# Patient Record
Sex: Female | Born: 1940 | Race: White | Hispanic: No | Smoking: Former smoker
Health system: Southern US, Community
[De-identification: ages and names within clinical notes are randomized; demographics above are authoritative.]

## PROBLEM LIST (undated history)

## (undated) DIAGNOSIS — G629 Polyneuropathy, unspecified: Secondary | ICD-10-CM

## (undated) DIAGNOSIS — G510 Bell's palsy: Secondary | ICD-10-CM

## (undated) DIAGNOSIS — F32A Depression, unspecified: Secondary | ICD-10-CM

## (undated) DIAGNOSIS — J449 Chronic obstructive pulmonary disease, unspecified: Secondary | ICD-10-CM

## (undated) DIAGNOSIS — C801 Malignant (primary) neoplasm, unspecified: Secondary | ICD-10-CM

## (undated) DIAGNOSIS — I10 Essential (primary) hypertension: Secondary | ICD-10-CM

## (undated) DIAGNOSIS — R1032 Left lower quadrant pain: Secondary | ICD-10-CM

## (undated) DIAGNOSIS — F329 Major depressive disorder, single episode, unspecified: Secondary | ICD-10-CM

## (undated) DIAGNOSIS — N952 Postmenopausal atrophic vaginitis: Secondary | ICD-10-CM

## (undated) DIAGNOSIS — R634 Abnormal weight loss: Secondary | ICD-10-CM

## (undated) DIAGNOSIS — M1990 Unspecified osteoarthritis, unspecified site: Secondary | ICD-10-CM

## (undated) DIAGNOSIS — R197 Diarrhea, unspecified: Secondary | ICD-10-CM

## (undated) DIAGNOSIS — R630 Anorexia: Secondary | ICD-10-CM

## (undated) DIAGNOSIS — R0600 Dyspnea, unspecified: Secondary | ICD-10-CM

## (undated) DIAGNOSIS — Z8742 Personal history of other diseases of the female genital tract: Secondary | ICD-10-CM

## (undated) HISTORY — DX: Left lower quadrant pain: R10.32

## (undated) HISTORY — PX: APPENDECTOMY: SHX54

## (undated) HISTORY — DX: Depression, unspecified: F32.A

## (undated) HISTORY — DX: Abnormal weight loss: R63.4

## (undated) HISTORY — DX: Polyneuropathy, unspecified: G62.9

## (undated) HISTORY — DX: Personal history of other diseases of the female genital tract: Z87.42

## (undated) HISTORY — PX: OTHER SURGICAL HISTORY: SHX169

## (undated) HISTORY — DX: Diarrhea, unspecified: R19.7

## (undated) HISTORY — PX: TOTAL KNEE ARTHROPLASTY: SHX125

## (undated) HISTORY — PX: KNEE SURGERY: SHX244

## (undated) HISTORY — PX: CHOLECYSTECTOMY: SHX55

## (undated) HISTORY — PX: TONSILLECTOMY AND ADENOIDECTOMY: SHX28

## (undated) HISTORY — PX: EYE SURGERY: SHX253

## (undated) HISTORY — DX: Essential (primary) hypertension: I10

## (undated) HISTORY — DX: Major depressive disorder, single episode, unspecified: F32.9

## (undated) HISTORY — DX: Bell's palsy: G51.0

## (undated) HISTORY — DX: Dyspnea, unspecified: R06.00

## (undated) HISTORY — PX: SHOULDER SURGERY: SHX246

## (undated) HISTORY — DX: Unspecified osteoarthritis, unspecified site: M19.90

## (undated) HISTORY — DX: Anorexia: R63.0

## (undated) HISTORY — PX: ABDOMINAL HYSTERECTOMY: SHX81

## (undated) HISTORY — PX: GALLBLADDER SURGERY: SHX652

## (undated) HISTORY — PX: TRIGGER FINGER RELEASE: SHX641

## (undated) HISTORY — DX: Hemochromatosis, unspecified: E83.119

## (undated) HISTORY — PX: ELBOW SURGERY: SHX618

## (undated) HISTORY — DX: Postmenopausal atrophic vaginitis: N95.2

## (undated) HISTORY — PX: JOINT REPLACEMENT: SHX530

---

## 2003-01-18 ENCOUNTER — Ambulatory Visit (HOSPITAL_COMMUNITY): Admission: RE | Admit: 2003-01-18 | Discharge: 2003-01-18 | Payer: Self-pay | Admitting: Internal Medicine

## 2003-01-18 ENCOUNTER — Encounter: Payer: Self-pay | Admitting: Internal Medicine

## 2003-03-07 ENCOUNTER — Ambulatory Visit (HOSPITAL_COMMUNITY): Admission: RE | Admit: 2003-03-07 | Discharge: 2003-03-07 | Payer: Self-pay | Admitting: Internal Medicine

## 2003-03-15 ENCOUNTER — Ambulatory Visit (HOSPITAL_COMMUNITY): Admission: RE | Admit: 2003-03-15 | Discharge: 2003-03-15 | Payer: Self-pay | Admitting: Internal Medicine

## 2003-03-15 ENCOUNTER — Encounter: Payer: Self-pay | Admitting: Internal Medicine

## 2007-10-22 ENCOUNTER — Encounter: Payer: Self-pay | Admitting: Internal Medicine

## 2007-11-06 ENCOUNTER — Ambulatory Visit: Payer: Self-pay | Admitting: Internal Medicine

## 2007-11-06 DIAGNOSIS — I10 Essential (primary) hypertension: Secondary | ICD-10-CM | POA: Insufficient documentation

## 2007-11-06 DIAGNOSIS — Z8679 Personal history of other diseases of the circulatory system: Secondary | ICD-10-CM | POA: Insufficient documentation

## 2007-11-06 DIAGNOSIS — J449 Chronic obstructive pulmonary disease, unspecified: Secondary | ICD-10-CM | POA: Insufficient documentation

## 2007-11-06 DIAGNOSIS — E7800 Pure hypercholesterolemia, unspecified: Secondary | ICD-10-CM

## 2007-11-06 DIAGNOSIS — R0989 Other specified symptoms and signs involving the circulatory and respiratory systems: Secondary | ICD-10-CM

## 2007-11-06 DIAGNOSIS — R0609 Other forms of dyspnea: Secondary | ICD-10-CM | POA: Insufficient documentation

## 2007-11-09 ENCOUNTER — Ambulatory Visit: Payer: Self-pay | Admitting: Internal Medicine

## 2007-11-09 LAB — CONVERTED CEMR LAB
BUN: 17 mg/dL (ref 6–23)
Chloride: 104 meq/L (ref 96–112)
GFR calc non Af Amer: 77 mL/min
Glucose, Bld: 77 mg/dL (ref 70–99)
Lymphocytes Relative: 35.8 % (ref 12.0–46.0)
MCHC: 34.6 g/dL (ref 30.0–36.0)
MCV: 94.7 fL (ref 78.0–100.0)
Monocytes Absolute: 0.4 10*3/uL (ref 0.1–1.0)
Monocytes Relative: 7.4 % (ref 3.0–12.0)
Neutrophils Relative %: 54.7 % (ref 43.0–77.0)
Platelets: 245 10*3/uL (ref 150–400)
Potassium: 4.7 meq/L (ref 3.5–5.1)
RDW: 12.1 % (ref 11.5–14.6)

## 2007-11-12 ENCOUNTER — Telehealth (INDEPENDENT_AMBULATORY_CARE_PROVIDER_SITE_OTHER): Payer: Self-pay | Admitting: *Deleted

## 2007-11-27 ENCOUNTER — Ambulatory Visit: Payer: Self-pay | Admitting: Internal Medicine

## 2007-11-27 DIAGNOSIS — R079 Chest pain, unspecified: Secondary | ICD-10-CM | POA: Insufficient documentation

## 2010-11-23 NOTE — Op Note (Signed)
NAME:  Jessica Serrano, Jessica Serrano                      ACCOUNT NO.:  [id number]   MEDICAL RECORD NO.:  [id number]                   PATIENT TYPE:  AMB   LOCATION:  DAY                                  FACILITY:  [facility]   PHYSICIAN:  R. Roetta Sessions, M.D.              DATE OF BIRTH:  [birth year]   DATE OF PROCEDURE:  03/07/2003  DATE OF DISCHARGE:                                 OPERATIVE REPORT   PROCEDURE:  Colonoscopy (diagnostic).   INDICATIONS FOR PROCEDURE:  The patient is a 70 year old lady with  intermittent right upper quadrant abdominal pain.  Comes and goes.  Not  affected by eating or bowel movement.  She has had it for several months.  No improvement with Levbid or Fiber Choice.  Prior laboratory evaluation  with repeat LFTs demonstrate no abnormalities.  The patient is status post  cholecystectomy.  Prior ultrasound demonstrated bile duct  5 mm (post-  cholecystectomy).  CT scan of the abdomen and pelvis recently demonstrated  thickening in the area of the sigmoid colon.  Colonoscopy is now being done  as a diagnostic maneuver.  Prior colonoscopy 10 years ago.  The potential  risks, benefits, and alternatives of colonoscopy were fully discussed with  the patient.  Questions were answered, and she is agreeable.   PROCEDURE:  O2 saturation, blood pressure, pulses, and respirations were  monitored throughout the entire procedure.  Conscious sedation was with  Versed 7 mg IV, Demerol 150 mg IV in divided doses.  The instrument used was  the Olympus video chip adult colonoscope.   FINDINGS:  Digital rectal examination revealed no abnormalities.   ENDOSCOPIC FINDINGS:  The prep was adequate.   Rectum:  Examination of the rectal mucosa including retroflex view of the  anal verge revealed no abnormalities.   Colon:  The colonic mucosa was surveyed from the rectosigmoid junction  through the left, transverse, right colon to the area of the ileocecal  valve.  The patient had  numerous left-sided diverticula.  Upon nearing the  right colon, we encountered recurrent looping.  This was not overcome in  spite of multiple different applications of external abdominal pressure and  change in the patient's position from supine to right side down.  The  ileocecal valve was seen; however, the cecum was not seen well.  From this  level, the scope was slowly withdrawn.  All previously mentioned mucosal  surfaces were again seen, and no other abnormalities were observed.  The  patient tolerated the procedure well and was reactive in endoscopy.   IMPRESSION:  1. Normal rectum.  2. Left-sided diverticula.  The colonic mucosa to the ileocecal valve     appeared normal.  Cecum not seen well.  3. I suspect the change as seen on CT recently is reflective of     diverticulosis.    RECOMMENDATIONS:  1. Proceed with an air contrast barium enema, as the image of  the cecum was     not seen well today.  Plan for next week.  2. Will make further recommendations after the barium enema is available for     review.                                               Jonathon Bellows, M.D.    RMR/MEDQ  D:  03/07/2003  T:  03/07/2003  Job:  [id number]   cc:   Wyvonnia Lora  [address]  [place]  [place] [address]  Fax: [phone]

## 2012-03-11 DIAGNOSIS — R0602 Shortness of breath: Secondary | ICD-10-CM

## 2013-02-10 ENCOUNTER — Encounter (INDEPENDENT_AMBULATORY_CARE_PROVIDER_SITE_OTHER): Payer: Self-pay | Admitting: *Deleted

## 2013-04-16 ENCOUNTER — Encounter (INDEPENDENT_AMBULATORY_CARE_PROVIDER_SITE_OTHER): Payer: Self-pay

## 2013-04-16 ENCOUNTER — Ambulatory Visit (INDEPENDENT_AMBULATORY_CARE_PROVIDER_SITE_OTHER): Payer: Medicare Other | Admitting: Neurology

## 2013-04-16 ENCOUNTER — Encounter: Payer: Self-pay | Admitting: Neurology

## 2013-04-16 VITALS — BP 153/69 | HR 52 | Ht 64.5 in | Wt 248.0 lb

## 2013-04-16 DIAGNOSIS — G609 Hereditary and idiopathic neuropathy, unspecified: Secondary | ICD-10-CM | POA: Insufficient documentation

## 2013-04-16 MED ORDER — GABAPENTIN 300 MG PO CAPS: 300.0000 mg | ORAL_CAPSULE | Freq: Three times a day (TID) | ORAL | Status: DC

## 2013-04-16 NOTE — Progress Notes (Signed)
[facility]  PATIENT: Jessica Serrano DOB: 10-Apr-1941  HISTORICAL Fiora is a 72 years old right-handed Caucasian female, referred by her primary care physician Dr. Jonni Sanger for evaluation of bilateral feet paresthesia  She had past medical history of hypertension, hyperlipidemia, depression, anxiety, presenting with bilateral feet paresthesia, since 2010,  Initially it only involves plantar surface, now whole feet, also to distal leg to mid shin level, she complains of numbness tingling burning needle prick sensation, she denies significant low back pain, no gait difficulty, she has no bilateral fingertips paresthesia.  She has no significant gait difficulty, no incontinence, she has been taking Requip 1 mg every night, which helped her nighttime discomfort.  The most bothersome symptoms in the late afternoon, and nighttimes, she complains of bilateral feet and distal leg discomfort, burning sensation, difficulty falling into sleep, she also complains of nighttime muscle cramping, has to jump out of the bed walking around, it happened about once a month,   Most recent laboratory evaluation showed normal CMP, CBC, B12 TSH, A1c was 5.4  REVIEW OF SYSTEMS: Full 14 system review of systems performed and notable only for weight gain, fatigue, swelling in legs, hearing loss, ringing in the ears, trouble swallowing, eye pain, shortness of breath, wheezing, urination problems, feeling cold, increased thirst, joint pain, joint swelling, cramps, achy muscles, allergy, numbness, difficulty swelling, dizziness, insomnia, restless legs, depression, not enough sleep, decreased energy, disinterested in activities  ALLERGIES: No Known Allergies  HOME MEDICATIONS: No outpatient prescriptions prior to visit.   No facility-administered medications prior to visit.    PAST MEDICAL HISTORY: Past Medical History  Diagnosis Date  . High blood pressure   . Depression   . Bell's  palsy     right  . Dyspnea   . Neuropathy     PAST SURGICAL HISTORY: Past Surgical History  Procedure Laterality Date  . Knee surgery Left   . Lap chole stones    . Gallbladder surgery    . Tonsillectomy and adenoidectomy    . Abdominal hysterectomy    . Elbow surgery    . Shoulder surgery    . Trigger finger release      FAMILY HISTORY: Family History  Problem Relation Age of Onset  . Stroke    . Stomach cancer    . Heart Problems    . Arthritis      SOCIAL HISTORY:  History   Social History  . Marital Status: Widowed    Spouse Name: N/A    Number of Children: 3  . Years of Education: 12   Occupational History  .      retired   Social History Main Topics  . Smoking status: Former Smoker    Types: Cigarettes  . Smokeless tobacco: Never Used     Comment: Quit in 1993  . Alcohol Use: No     Comment: Quit in 1986  . Drug Use: No  . Sexual Activity: Not on file   Other Topics Concern  . Not on file   Social History Narrative   Patient is retired and lives at home alone.    High school education.   Caffeine - two cups daily.     PHYSICAL EXAM   Filed Vitals:   04/16/13 0946  BP: 153/69  Pulse: 52  Height: 5' 4.5" (1.638 m)  Weight: 248 lb (112.492 kg)    Body mass index is 41.93 kg/(m^2).   Generalized: In no acute distress  Neck: Supple, no carotid bruits   Cardiac: Regular rate rhythm  Pulmonary: Clear to auscultation bilaterally  Musculoskeletal: No deformity  Neurological examination  Mentation: Alert oriented to time, place, history taking, and causual conversation  Cranial nerve II-XII: Pupils were equal round reactive to light extraocular movements were full, visual field were full on confrontational test. facial sensation and strength were normal. hearing was intact to finger rubbing bilaterally. Uvula tongue midline.  head turning and shoulder shrug and were normal and symmetric.Tongue protrusion into cheek strength was  normal.  Motor: normal tone, bulk and strength.  Sensory: Mildly length dependent decreased  fine touch, pinprick to mid shin level, preserved vibratory sensation, and proprioception at toes.  Coordination: Normal finger to nose, heel-to-shin bilaterally there was no truncal ataxia  Gait: Rising up from seated position without assistance, normal stance, without trunk ataxia, moderate stride, good arm swing, smooth turning, able to perform tiptoe, and heel walking without difficulty.   Romberg signs: Negative  Deep tendon reflexes: Brachioradialis 2/2, biceps 2/2, triceps 2/2, patellar 2/0-left knee replacement, Achilles 1/1, plantar responses were flexor bilaterally.   DIAGNOSTIC DATA (LABS, IMAGING, TESTING) - I reviewed patient records, labs, notes, testing and imaging myself where available.  Lab Results  Component Value Date   WBC 5.7 11/06/2007   HGB 14.6 11/06/2007   HCT 42.1 11/06/2007   MCV 94.7 11/06/2007   PLT 245 11/06/2007      Component Value Date/Time   NA 141 11/06/2007 1546   K 4.7 11/06/2007 1546   CL 104 11/06/2007 1546   CO2 30 11/06/2007 1546   GLUCOSE 77 11/06/2007 1546   BUN 17 11/06/2007 1546   CREATININE 0.8 11/06/2007 1546   CALCIUM 9.7 11/06/2007 1546   GFRNONAA 77 11/06/2007 1546   GFRAA 93 11/06/2007 1546    ASSESSMENT AND PLAN   72 years old Caucasian female, presenting with 4 years history of bilateral feet paresthesia, most consistent with peripheral neuropathy  1. complete evaluation with EMG nerve conduction study 2. Neurontin 300 mg 3 times a day.         Levert Feinstein, M.D. Ph.D.  [facility] [address] [place], [place] [address] [phone]

## 2013-05-03 ENCOUNTER — Encounter (INDEPENDENT_AMBULATORY_CARE_PROVIDER_SITE_OTHER): Payer: Self-pay

## 2013-05-03 ENCOUNTER — Ambulatory Visit (INDEPENDENT_AMBULATORY_CARE_PROVIDER_SITE_OTHER): Payer: Medicare Other | Admitting: Neurology

## 2013-05-03 DIAGNOSIS — G609 Hereditary and idiopathic neuropathy, unspecified: Secondary | ICD-10-CM

## 2013-05-03 DIAGNOSIS — Z0289 Encounter for other administrative examinations: Secondary | ICD-10-CM

## 2013-05-03 NOTE — Procedures (Signed)
    [facility]  NCS (NERVE CONDUCTION STUDY) WITH EMG (ELECTROMYOGRAPHY) REPORT   STUDY DATE: 05/03/2013 PATIENT NAME: Jessica Serrano DOB: 09/08/1940 MRN: [id number]    TECHNOLOGIST: Gearldine Shown ELECTROMYOGRAPHER: Levert Feinstein M.D.  CLINICAL INFORMATION:   72 years old Caucasian female, with bilateral feet paresthesia,  On examination, bilateral lower extremity motor strength is normal, mildly length dependent sensory changes to midshin level   FINDINGS: NERVE CONDUCTION STUDY: Bilateral peroneal sensory responses were normal. Bilateral tibial motor responses were normal. Right peroneal to EDB motor response was normal. Left peroneal to EDB motor response showed severely decreased C. map amplitude, with normal conduction velocity. Bilateral tibial H. reflexes were normal and symmetric         NEEDLE ELECTROMYOGRAPHY: Selected needle examination was performed at left lower extremity muscles, and the left lumbosacral paraspinal muscles.  Needle examination of left tibialis anterior, tibialis posterior, medial gastrocnemius, vastus lateralis, biceps femoris long head was normal. There was no spontaneous activity at left lumbosacral paraspinal muscles, left L4, L5, S1.  IMPRESSION:  This is a slight abnormal study.  There is no electrodiagnostic evidence of large fiber peripheral neuropathy, or left lumbosacral radiculopathy.  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     INTERPRETING PHYSICIAN:   Levert Feinstein M.D. Ph.D. [facility] [address], [address] [place], [place] [address] [phone]

## 2013-05-05 ENCOUNTER — Telehealth: Payer: Self-pay | Admitting: Neurology

## 2013-07-15 DIAGNOSIS — G609 Hereditary and idiopathic neuropathy, unspecified: Secondary | ICD-10-CM | POA: Diagnosis not present

## 2013-07-15 DIAGNOSIS — M1710 Unilateral primary osteoarthritis, unspecified knee: Secondary | ICD-10-CM | POA: Diagnosis not present

## 2013-07-15 DIAGNOSIS — I10 Essential (primary) hypertension: Secondary | ICD-10-CM | POA: Diagnosis not present

## 2013-09-13 DIAGNOSIS — R1110 Vomiting, unspecified: Secondary | ICD-10-CM | POA: Diagnosis not present

## 2013-09-13 DIAGNOSIS — R1011 Right upper quadrant pain: Secondary | ICD-10-CM | POA: Diagnosis not present

## 2013-09-13 DIAGNOSIS — R300 Dysuria: Secondary | ICD-10-CM | POA: Diagnosis not present

## 2013-09-22 DIAGNOSIS — Z9089 Acquired absence of other organs: Secondary | ICD-10-CM | POA: Diagnosis not present

## 2013-09-22 DIAGNOSIS — R1011 Right upper quadrant pain: Secondary | ICD-10-CM | POA: Diagnosis not present

## 2013-09-22 DIAGNOSIS — K7689 Other specified diseases of liver: Secondary | ICD-10-CM | POA: Diagnosis not present

## 2013-09-22 DIAGNOSIS — K429 Umbilical hernia without obstruction or gangrene: Secondary | ICD-10-CM | POA: Diagnosis not present

## 2013-09-22 DIAGNOSIS — R112 Nausea with vomiting, unspecified: Secondary | ICD-10-CM | POA: Diagnosis not present

## 2013-10-12 DIAGNOSIS — I789 Disease of capillaries, unspecified: Secondary | ICD-10-CM | POA: Diagnosis not present

## 2013-10-12 DIAGNOSIS — L570 Actinic keratosis: Secondary | ICD-10-CM | POA: Diagnosis not present

## 2013-10-12 DIAGNOSIS — D1800 Hemangioma unspecified site: Secondary | ICD-10-CM | POA: Diagnosis not present

## 2013-10-12 DIAGNOSIS — L738 Other specified follicular disorders: Secondary | ICD-10-CM | POA: Diagnosis not present

## 2013-10-12 DIAGNOSIS — D485 Neoplasm of uncertain behavior of skin: Secondary | ICD-10-CM | POA: Diagnosis not present

## 2013-11-02 ENCOUNTER — Ambulatory Visit: Payer: Medicare Other | Admitting: Nurse Practitioner

## 2013-11-02 DIAGNOSIS — D485 Neoplasm of uncertain behavior of skin: Secondary | ICD-10-CM | POA: Diagnosis not present

## 2013-11-02 DIAGNOSIS — L570 Actinic keratosis: Secondary | ICD-10-CM | POA: Diagnosis not present

## 2013-11-02 DIAGNOSIS — L259 Unspecified contact dermatitis, unspecified cause: Secondary | ICD-10-CM | POA: Diagnosis not present

## 2013-11-08 ENCOUNTER — Encounter (INDEPENDENT_AMBULATORY_CARE_PROVIDER_SITE_OTHER): Payer: Self-pay | Admitting: *Deleted

## 2013-11-17 DIAGNOSIS — R3915 Urgency of urination: Secondary | ICD-10-CM | POA: Diagnosis not present

## 2013-11-17 DIAGNOSIS — R350 Frequency of micturition: Secondary | ICD-10-CM | POA: Diagnosis not present

## 2013-12-06 ENCOUNTER — Ambulatory Visit (INDEPENDENT_AMBULATORY_CARE_PROVIDER_SITE_OTHER): Payer: Medicare Other | Admitting: Internal Medicine

## 2013-12-06 ENCOUNTER — Encounter (INDEPENDENT_AMBULATORY_CARE_PROVIDER_SITE_OTHER): Payer: Self-pay | Admitting: Internal Medicine

## 2013-12-06 VITALS — BP 130/58 | HR 72 | Temp 98.0°F | Ht 64.0 in | Wt 247.7 lb

## 2013-12-06 DIAGNOSIS — R1310 Dysphagia, unspecified: Secondary | ICD-10-CM | POA: Insufficient documentation

## 2013-12-06 DIAGNOSIS — K219 Gastro-esophageal reflux disease without esophagitis: Secondary | ICD-10-CM | POA: Insufficient documentation

## 2013-12-06 MED ORDER — OMEPRAZOLE 20 MG PO CPDR: 20.0000 mg | DELAYED_RELEASE_CAPSULE | Freq: Two times a day (BID) | ORAL | Status: DC

## 2013-12-06 MED ORDER — OMEPRAZOLE 20 MG PO CPDR: 20.0000 mg | DELAYED_RELEASE_CAPSULE | Freq: Every day | ORAL | Status: DC

## 2013-12-06 NOTE — Progress Notes (Signed)
Subjective:     Patient ID: Jessica Serrano, female   DOB: 11/02/1940, 73 y.o.   MRN: [id number]  HPI Referred to our office by Dr. Matthias Hughs for nausea. She tells me she became sick in March. She had symptoms of nausea and vomiting. The N and V lasted for about 2 hrs. The vomitus was yellow. She said she had burning in her esophagus. She could not eat. She could only tolerate soup. Her symptoms lasted for about 6 weeks. She lost about 10 pounds. She has gained 7 pounds of this back.  She says her acid reflux is better. Acid reflux sometimes will come up into her esophagus and burns.  She feels foods and some of her medications are slow to go down. She will drink fluids to force the bolus to go down.  Breads are slow to go down. She rarely eats beef. Appetite is better.  She does c/o rt upper quadrant tenderness off and on since her GB surgery 03/07/2003 Colonoscopy: Dr. Gala Romney 1. Normal rectum.  2. Left-sided diverticula. The colonic mucosa to the ileocecal valve  appeared normal. Cecum not seen well.  3. I suspect the change as seen on CT recently is reflective of  diverticulosis.  09/13/2013 H andH 14.7 and 44.2,  ALP 71, GOT 18, SGPT 20, Total bili 0.9, Total protein 6.7 09/22/2013 CT abdomen/pelvis with CM:  No acute abdominall findings, mass lesions or adenopathy. Small periumbilical abdominal wall hernia containing fate. Mild diffuse fatty infilatration of the liver. Status post cholecystectomy without intra or extrahepatic biliary dilatation.   Review of Systems   Past Medical History  Diagnosis Date  . High blood pressure   . Depression   . Bell's palsy     right  . Dyspnea   . Neuropathy   . Hemochromatosis   . Osteoarthritis     knees    Past Surgical History  Procedure Laterality Date  . Knee surgery Left   . Lap chole stones    . Gallbladder surgery    . Tonsillectomy and adenoidectomy    . Abdominal hysterectomy    . Elbow surgery    . Shoulder surgery    .  Trigger finger release    . Total knee arthroplasty      1998 left knee    No Known Allergies  Current Outpatient Prescriptions on File Prior to Visit  Medication Sig Dispense Refill  . aspirin 81 MG tablet Take 81 mg by mouth daily.      Marland Kitchen atenolol (TENORMIN) 50 MG tablet Take 50 mg by mouth daily.      . fluticasone (FLONASE) 50 MCG/ACT nasal spray as needed.       . meloxicam (MOBIC) 15 MG tablet Take 15 mg by mouth daily.      Marland Kitchen omeprazole (PRILOSEC) 40 MG capsule Take 40 mg by mouth daily.      Marland Kitchen rOPINIRole (REQUIP) 1 MG tablet Take 1 mg by mouth 3 (three) times daily.      . simvastatin (ZOCOR) 20 MG tablet Take 20 mg by mouth every evening.       No current facility-administered medications on file prior to visit.        Objective:   Physical Exam  Filed Vitals:   12/06/13 1517  BP: 130/58  Pulse: 72  Temp: 98 F (36.7 C)  Height: 5\' 4"  (1.626 m)  Weight: 247 lb 11.2 oz (112.356 kg)     Alert and oriented. Skin  warm and dry. Oral mucosa is moist.   . Sclera anicteric, conjunctivae is pink. Thyroid not enlarged. No cervical lymphadenopathy. Lungs clear. Heart regular rate and rhythm.  Abdomen is soft. Bowel sounds are positive. No hepatomegaly. No abdominal masses felt. No tenderness.  No edema to lower extremities.      Assessment:    GERD. ?PUD   Nausea which has resolved now.  She does c/o of some dysphagia and GERD. PUD needs to be ruled out.      Plan:    Omeprazole 20mg  BID. OV in 2 months. Will reevaluate in 2 months.  Symptom diary with next visit. Patient declined an EGD today.

## 2013-12-06 NOTE — Patient Instructions (Signed)
Omeprazole 20mg  BID. OV in 2 months to see if her symptoms are better. She has declined an EGD/ED today

## 2014-01-05 DIAGNOSIS — G609 Hereditary and idiopathic neuropathy, unspecified: Secondary | ICD-10-CM | POA: Diagnosis not present

## 2014-01-05 DIAGNOSIS — I10 Essential (primary) hypertension: Secondary | ICD-10-CM | POA: Diagnosis not present

## 2014-01-05 DIAGNOSIS — R252 Cramp and spasm: Secondary | ICD-10-CM | POA: Diagnosis not present

## 2014-02-07 ENCOUNTER — Ambulatory Visit (INDEPENDENT_AMBULATORY_CARE_PROVIDER_SITE_OTHER): Payer: Medicare Other | Admitting: Internal Medicine

## 2014-02-08 ENCOUNTER — Ambulatory Visit (INDEPENDENT_AMBULATORY_CARE_PROVIDER_SITE_OTHER): Payer: Medicare Other | Admitting: Internal Medicine

## 2014-03-01 ENCOUNTER — Other Ambulatory Visit (INDEPENDENT_AMBULATORY_CARE_PROVIDER_SITE_OTHER): Payer: Self-pay | Admitting: *Deleted

## 2014-03-01 ENCOUNTER — Encounter (INDEPENDENT_AMBULATORY_CARE_PROVIDER_SITE_OTHER): Payer: Self-pay | Admitting: *Deleted

## 2014-03-01 ENCOUNTER — Ambulatory Visit (INDEPENDENT_AMBULATORY_CARE_PROVIDER_SITE_OTHER): Payer: Medicare Other | Admitting: Internal Medicine

## 2014-03-01 ENCOUNTER — Encounter (INDEPENDENT_AMBULATORY_CARE_PROVIDER_SITE_OTHER): Payer: Self-pay | Admitting: Internal Medicine

## 2014-03-01 VITALS — BP 164/82 | HR 68 | Temp 98.3°F | Ht 64.5 in | Wt 239.7 lb

## 2014-03-01 DIAGNOSIS — R1310 Dysphagia, unspecified: Secondary | ICD-10-CM | POA: Diagnosis not present

## 2014-03-01 DIAGNOSIS — K219 Gastro-esophageal reflux disease without esophagitis: Secondary | ICD-10-CM

## 2014-03-01 MED ORDER — PANTOPRAZOLE SODIUM 40 MG PO TBEC: 40.0000 mg | DELAYED_RELEASE_TABLET | Freq: Every day | ORAL | Status: DC

## 2014-03-01 NOTE — Progress Notes (Addendum)
Subjective:     Patient ID: Jessica Serrano, female   DOB: 1940-12-19, 73 y.o.   MRN: [id number]  HPIHere today for f/u of her nausea. She was last seen in June of this year. She declined an EGD. She gave a history in June of having nausea and vomiting since March. She had burning in her esophagus. Her symptoms lasted about out 6 weeks.  She lost about 10 pounds during the 6 weeks but gained 7 pounds back.  She also gave a hx of solid foods dysphagia.  She says she still have some burning in her esophagus. The burning occurs depending on what she eats. She ate cheese yesterday and her esophagus burned all day.  Sometimes she has trouble swallowing her medication. She also tells foods are slow to go down. She has to drink water for the bolus to go down.  Appetite is not great. She has lost 8 pounds since her last visit in June. Dysphagia does not occur every day.  She usually has a BM once every 2 days. No melena or BRRB.  Her last colonoscopy in 2011 by Dr. Anthony Sar and was normal per patient.  Patient is requesting an EGD  03/07/2003 Colonoscopy: Dr. Gala Romney  1. Normal rectum.  2. Left-sided diverticula. The colonic mucosa to the ileocecal valve  appeared normal. Cecum not seen well.  3. I suspect the change as seen on CT recently is reflective of  diverticulosis.  09/13/2013 H andH 14.7 and 44.2, ALP 71, GOT 18, SGPT 20, Total bili 0.9, Total protein 6.7  09/22/2013 CT abdomen/pelvis with CM: No acute abdominall findings, mass lesions or adenopathy. Small periumbilical abdominal wall hernia containing fate. Mild diffuse fatty infilatration of the liver. Status post cholecystectomy without intra or extrahepatic biliary dilatation.      Review of Systems Past Medical History  Diagnosis Date  . High blood pressure   . Depression   . Bell's palsy     right  . Dyspnea   . Neuropathy   . Hemochromatosis   . Osteoarthritis     knees    Past Surgical History  Procedure Laterality Date  .  Knee surgery Left   . Lap chole stones    . Gallbladder surgery    . Tonsillectomy and adenoidectomy    . Abdominal hysterectomy    . Elbow surgery    . Shoulder surgery    . Trigger finger release    . Total knee arthroplasty      1998 left knee  . Cholecystectomy      1998    No Known Allergies  Current Outpatient Prescriptions on File Prior to Visit  Medication Sig Dispense Refill  . amLODipine (NORVASC) 2.5 MG tablet Take 2.5 mg by mouth daily.      Marland Kitchen atenolol (TENORMIN) 50 MG tablet Take 50 mg by mouth daily.      . fluticasone (FLONASE) 50 MCG/ACT nasal spray as needed.       Marland Kitchen HYDROcodone-acetaminophen (NORCO) 7.5-325 MG per tablet Take 1 tablet by mouth every 6 (six) hours as needed for moderate pain.      Marland Kitchen losartan (COZAAR) 100 MG tablet Take 100 mg by mouth daily.      . meloxicam (MOBIC) 15 MG tablet Take 15 mg by mouth daily.      Marland Kitchen omeprazole (PRILOSEC) 20 MG capsule Take 1 capsule (20 mg total) by mouth 2 (two) times daily before a meal.  60 capsule  3  .  oxybutynin (DITROPAN-XL) 5 MG 24 hr tablet Take 5 mg by mouth at bedtime.      Marland Kitchen rOPINIRole (REQUIP) 1 MG tablet Take 1 mg by mouth 3 (three) times daily.      . simvastatin (ZOCOR) 20 MG tablet Take 20 mg by mouth every evening.       No current facility-administered medications on file prior to visit.        Objective:   Physical Exam Filed Vitals:   03/01/14 1031  BP: 164/82  Pulse: 68  Temp: 98.3 F (36.8 C)  Height: 5' 4.5" (1.638 m)  Weight: 239 lb 11.2 oz (108.727 kg)   Alert and oriented. Skin warm and dry. Oral mucosa is moist.   . Sclera anicteric, conjunctivae is pink. Thyroid not enlarged. No cervical lymphadenopathy. Lungs clear. Heart regular rate and rhythm.  Abdomen is soft. Bowel sounds are positive. No hepatomegaly. No abdominal masses felt. No tenderness.  No edema to lower extremities.       Assessment:     GERD not controlled at this time. Omeprazole is not helping per patient.   Dysphagia to solids.     Plan:     EGD/ED. The risks and benefits such as perforation, bleeding, and infection were reviewed with the patient and is agreeable. Protonix 40mg  daily 30 minutes before breakfast.

## 2014-03-01 NOTE — Patient Instructions (Signed)
EGD/ED. The risks and benefits such as perforation, bleeding, and infection were reviewed with the patient and is agreeable. GERD diet.

## 2014-03-02 ENCOUNTER — Encounter (HOSPITAL_COMMUNITY): Payer: Self-pay | Admitting: Pharmacy Technician

## 2014-03-18 ENCOUNTER — Telehealth (INDEPENDENT_AMBULATORY_CARE_PROVIDER_SITE_OTHER): Payer: Self-pay | Admitting: *Deleted

## 2014-03-18 ENCOUNTER — Encounter (HOSPITAL_COMMUNITY)
Admission: RE | Disposition: A | Discharge status: Home / Self Care | Payer: Self-pay | Source: Ambulatory Visit | Attending: Internal Medicine

## 2014-03-18 ENCOUNTER — Ambulatory Visit (HOSPITAL_COMMUNITY)
Admission: RE | Admit: 2014-03-18 | Discharge: 2014-03-18 | Disposition: A | Discharge status: Home / Self Care | Payer: Medicare Other | Source: Ambulatory Visit | Attending: Internal Medicine | Admitting: Internal Medicine

## 2014-03-18 ENCOUNTER — Encounter (HOSPITAL_COMMUNITY): Payer: Self-pay | Admitting: *Deleted

## 2014-03-18 DIAGNOSIS — F3289 Other specified depressive episodes: Secondary | ICD-10-CM | POA: Diagnosis not present

## 2014-03-18 DIAGNOSIS — K208 Other esophagitis without bleeding: Secondary | ICD-10-CM | POA: Diagnosis not present

## 2014-03-18 DIAGNOSIS — F329 Major depressive disorder, single episode, unspecified: Secondary | ICD-10-CM | POA: Diagnosis not present

## 2014-03-18 DIAGNOSIS — Z87891 Personal history of nicotine dependence: Secondary | ICD-10-CM | POA: Insufficient documentation

## 2014-03-18 DIAGNOSIS — R1310 Dysphagia, unspecified: Secondary | ICD-10-CM | POA: Diagnosis not present

## 2014-03-18 DIAGNOSIS — Z79899 Other long term (current) drug therapy: Secondary | ICD-10-CM | POA: Diagnosis not present

## 2014-03-18 DIAGNOSIS — R1011 Right upper quadrant pain: Secondary | ICD-10-CM | POA: Insufficient documentation

## 2014-03-18 DIAGNOSIS — K2970 Gastritis, unspecified, without bleeding: Secondary | ICD-10-CM

## 2014-03-18 DIAGNOSIS — K219 Gastro-esophageal reflux disease without esophagitis: Secondary | ICD-10-CM | POA: Insufficient documentation

## 2014-03-18 DIAGNOSIS — R110 Nausea: Secondary | ICD-10-CM | POA: Insufficient documentation

## 2014-03-18 DIAGNOSIS — R030 Elevated blood-pressure reading, without diagnosis of hypertension: Secondary | ICD-10-CM | POA: Insufficient documentation

## 2014-03-18 DIAGNOSIS — K449 Diaphragmatic hernia without obstruction or gangrene: Secondary | ICD-10-CM | POA: Insufficient documentation

## 2014-03-18 DIAGNOSIS — K2960 Other gastritis without bleeding: Secondary | ICD-10-CM | POA: Diagnosis not present

## 2014-03-18 HISTORY — PX: ESOPHAGOGASTRODUODENOSCOPY: SHX5428

## 2014-03-18 HISTORY — PX: MALONEY DILATION: SHX5535

## 2014-03-18 SURGERY — EGD (ESOPHAGOGASTRODUODENOSCOPY)
Anesthesia: Moderate Sedation

## 2014-03-18 MED ORDER — MIDAZOLAM HCL 5 MG/5ML IJ SOLN
INTRAMUSCULAR | Status: AC
  Filled 2014-03-18: qty 10

## 2014-03-18 MED ORDER — MIDAZOLAM HCL 5 MG/5ML IJ SOLN
INTRAMUSCULAR | Status: DC | PRN
  Administered 2014-03-18 (×3): 2 mg via INTRAVENOUS

## 2014-03-18 MED ORDER — MEPERIDINE HCL 50 MG/ML IJ SOLN
INTRAMUSCULAR | Status: AC
  Filled 2014-03-18: qty 1

## 2014-03-18 MED ORDER — MEPERIDINE HCL 50 MG/ML IJ SOLN
INTRAMUSCULAR | Status: DC | PRN
  Administered 2014-03-18 (×2): 25 mg via INTRAVENOUS

## 2014-03-18 MED ORDER — STERILE WATER FOR IRRIGATION IR SOLN
Status: DC | PRN
  Administered 2014-03-18: 08:00:00

## 2014-03-18 MED ORDER — BUTAMBEN-TETRACAINE-BENZOCAINE 2-2-14 % EX AERO
INHALATION_SPRAY | CUTANEOUS | Status: DC | PRN
Start: 2014-03-18 — End: 2014-03-18
  Administered 2014-03-18: 2 via TOPICAL

## 2014-03-18 MED ORDER — SUCRALFATE 1 GM/10ML PO SUSP: 2.0000 g | Freq: Every day | ORAL | Status: DC

## 2014-03-18 MED ORDER — SODIUM CHLORIDE 0.9 % IV SOLN
INTRAVENOUS | Status: DC
  Administered 2014-03-18: 08:00:00 via INTRAVENOUS

## 2014-03-18 NOTE — Op Note (Signed)
EGD PROCEDURE REPORT  PATIENT:  Jessica Serrano  MR#:  [id number] Birthdate:  1941/02/24, 73 y.o., female Endoscopist:  Dr. Rogene Houston, MD Referred By:  Dr. Matthias Hughs, MD  Procedure Date: 03/18/2014  Procedure:   EGD with ED.  Indications:  Patient is a 73 year old Caucasian female with chronic GERD symptoms poorly controlled on double dose omeprazole and better on pantoprazole also complains of postprandial nausea, intermittent right upper quadrant abdominal pain and dysphagia to pills and solids.            Informed Consent:  The risks, benefits, alternatives & imponderables which include, but are not limited to, bleeding, infection, perforation, drug reaction and potential missed lesion have been reviewed.  The potential for biopsy, lesion removal, esophageal dilation, etc. have also been discussed.  Questions have been answered.  All parties agreeable.  Please see history & physical in medical record for more information.  Medications:  Demerol 50 mg IV Versed 6 mg IV Cetacaine spray topically for oropharyngeal anesthesia  Description of procedure:  The endoscope was introduced through the mouth and advanced to the second portion of the duodenum without difficulty or limitations. The mucosal surfaces were surveyed very carefully during advancement of the scope and upon withdrawal.  Findings:  Esophagus:  Mucosa of the esophagus was normal. Erythema noted at GE junction without ring or stricture formation GEJ:  37 cm Hiatus:  39 cm Stomach:  Moderate amount of bile noted in the stomach. Stomach distended very well with insufflation. Folds in the proximal stomach were unremarkable. Examination of  mucosa at body was normal. Antral mucosa revealed linear and patchy erythema. No erosions or ulcers are present. The pyloric channel was patent. Angularis fundus and cardia were examined by retroflexion the scope and were normal. Duodenum:  Normal bulbar and post bulbar  mucosa.  Therapeutic/Diagnostic Maneuvers Performed:   Esophagus dilated by passing a 56 French balloon dilator to full insertion. As dilator was withdrawn and the scope was passed again no mucosal disruption noted.  Complications:  None  Impression: Small sliding hiatal hernia with mild changes of reflux esophagitis limited to GE junction without ring or stricture formation. Duodenogastric reflux. No erosive antral gastritis. Esophagus dilated by passing a 54 [place] Maloney dilator.  Recommendations:  Continue pantoprazole at 40 mg by mouth every morning. H. pylori serology. Sucralfate 2 g by mouth each bedtime. I will be contacting patient is also blood work and further recommendations.  Randell Detter U  03/18/2014  8:09 AM  CC: Dr. Deloria Lair, MD & Dr. Rayne Du ref. provider found

## 2014-03-18 NOTE — Discharge Instructions (Signed)
Resume usual medications and diet. Sucralfate 2 g by mouth daily at bedtime. No driving for 24 hours. Physician will call with results of blood test.  Gastrointestinal Endoscopy, Care After Refer to this sheet in the next few weeks. These instructions provide you with information on caring for yourself after your procedure. Your caregiver may also give you more specific instructions. Your treatment has been planned according to current medical practices, but problems sometimes occur. Call your caregiver if you have any problems or questions after your procedure. HOME CARE INSTRUCTIONS  If you were given medicine to help you relax (sedative), do not drive, operate machinery, or sign important documents for 24 hours.  Avoid alcohol and hot or warm beverages for the first 24 hours after the procedure.  Only take over-the-counter or prescription medicines for pain, discomfort, or fever as directed by your caregiver. You may resume taking your normal medicines unless your caregiver tells you otherwise. Ask your caregiver when you may resume taking medicines that may cause bleeding, such as aspirin, clopidogrel, or warfarin.  You may return to your normal diet and activities on the day after your procedure, or as directed by your caregiver. Walking may help to reduce any bloated feeling in your abdomen.  Drink enough fluids to keep your urine clear or pale yellow.  You may gargle with salt water if you have a sore throat. SEEK IMMEDIATE MEDICAL CARE IF:  You have severe nausea or vomiting.  You have severe abdominal pain, abdominal cramps that last longer than 6 hours, or abdominal swelling (distention).  You have severe shoulder or back pain.  You have trouble swallowing.  You have shortness of breath, your breathing is shallow, or you are breathing faster than normal.  You have a fever or a rapid heartbeat.  You vomit blood or material that looks like coffee grounds.  You have bloody,  black, or tarry stools. MAKE SURE YOU:  Understand these instructions.  Will watch your condition.  Will get help right away if you are not doing well or get worse. Document Released: 02/06/2004 Document Revised: 11/08/2013 Document Reviewed: 09/24/2011 [facility] Patient Information 2015 [place], [place]. This information is not intended to replace advice given to you by your health care provider. Make sure you discuss any questions you have with your health care provider.  Gastroesophageal Reflux Disease, Adult Gastroesophageal reflux disease (GERD) happens when acid from your stomach flows up into the esophagus. When acid comes in contact with the esophagus, the acid causes soreness (inflammation) in the esophagus. Over time, GERD may create small holes (ulcers) in the lining of the esophagus. CAUSES   Increased body weight. This puts pressure on the stomach, making acid rise from the stomach into the esophagus.  Smoking. This increases acid production in the stomach.  Drinking alcohol. This causes decreased pressure in the lower esophageal sphincter (valve or ring of muscle between the esophagus and stomach), allowing acid from the stomach into the esophagus.  Late evening meals and a full stomach. This increases pressure and acid production in the stomach.  A malformed lower esophageal sphincter. Sometimes, no cause is found. SYMPTOMS   Burning pain in the lower part of the mid-chest behind the breastbone and in the mid-stomach area. This may occur twice a week or more often.  Trouble swallowing.  Sore throat.  Dry cough.  Asthma-like symptoms including chest tightness, shortness of breath, or wheezing. DIAGNOSIS  Your caregiver may be able to diagnose GERD based on your symptoms. In  some cases, X-rays and other tests may be done to check for complications or to check the condition of your stomach and esophagus. TREATMENT  Your caregiver may recommend over-the-counter or  prescription medicines to help decrease acid production. Ask your caregiver before starting or adding any new medicines.  HOME CARE INSTRUCTIONS   Change the factors that you can control. Ask your caregiver for guidance concerning weight loss, quitting smoking, and alcohol consumption.  Avoid foods and drinks that make your symptoms worse, such as:  Caffeine or alcoholic drinks.  Chocolate.  Peppermint or mint flavorings.  Garlic and onions.  Spicy foods.  Citrus fruits, such as oranges, lemons, or limes.  Tomato-based foods such as sauce, chili, salsa, and pizza.  Fried and fatty foods.  Avoid lying down for the 3 hours prior to your bedtime or prior to taking a nap.  Eat small, frequent meals instead of large meals.  Wear loose-fitting clothing. Do not wear anything tight around your waist that causes pressure on your stomach.  Raise the head of your bed 6 to 8 inches with wood blocks to help you sleep. Extra pillows will not help.  Only take over-the-counter or prescription medicines for pain, discomfort, or fever as directed by your caregiver.  Do not take aspirin, ibuprofen, or other nonsteroidal anti-inflammatory drugs (NSAIDs). SEEK IMMEDIATE MEDICAL CARE IF:   You have pain in your arms, neck, jaw, teeth, or back.  Your pain increases or changes in intensity or duration.  You develop nausea, vomiting, or sweating (diaphoresis).  You develop shortness of breath, or you faint.  Your vomit is green, yellow, black, or looks like coffee grounds or blood.  Your stool is red, bloody, or black. These symptoms could be signs of other problems, such as heart disease, gastric bleeding, or esophageal bleeding. MAKE SURE YOU:   Understand these instructions.  Will watch your condition.  Will get help right away if you are not doing well or get worse. Document Released: 04/03/2005 Document Revised: 09/16/2011 Document Reviewed: 01/11/2011 [facility] Patient  Information 2015 [place], [place]. This information is not intended to replace advice given to you by your health care provider. Make sure you discuss any questions you have with your health care provider.

## 2014-03-18 NOTE — H&P (Signed)
Jessica Serrano is an 73 y.o. female.   Chief Complaint: Patient is here for EGD and ED. HPI: Patient is a 81-year-old Caucasian female who has chronic GERD on omeprazole having frequent heartburn and postprandial nausea. She also has been having dysphagia to solids and pills for several months. She was switched from omeprazole to pantoprazole and feels better as for his heart is concerned. She has not had vomiting recently. She denies melena rectal bleeding. She is and sent for years does not recall ever being diagnosed with peptic ulcer disease. Last EGD was over 25 years ago and results are not known.  Past Medical History  Diagnosis Date  . High blood pressure   . Depression   . Bell's palsy     right  . Dyspnea   . Neuropathy   . Hemochromatosis   . Osteoarthritis     knees    Past Surgical History  Procedure Laterality Date  . Knee surgery Left   . Lap chole stones    . Gallbladder surgery    . Tonsillectomy and adenoidectomy    . Abdominal hysterectomy    . Elbow surgery    . Shoulder surgery    . Trigger finger release    . Total knee arthroplasty      1998 left knee  . Cholecystectomy      1998    Family History  Problem Relation Age of Onset  . Stroke    . Stomach cancer    . Heart Problems    . Arthritis     Social History:  reports that she quit smoking about 23 years ago. Her smoking use included Cigarettes. She has a 126 pack-year smoking history. She has never used smokeless tobacco. She reports that she does not drink alcohol or use illicit drugs.  Allergies: No Known Allergies  Medications Prior to Admission  Medication Sig Dispense Refill  . amLODipine (NORVASC) 2.5 MG tablet Take 2.5 mg by mouth daily.      Marland Kitchen atenolol (TENORMIN) 50 MG tablet Take 50 mg by mouth daily.      . fluticasone (FLONASE) 50 MCG/ACT nasal spray as needed.       Marland Kitchen HYDROcodone-acetaminophen (NORCO) 7.5-325 MG per tablet Take 1 tablet by mouth every 6 (six) hours as needed  for moderate pain.      Marland Kitchen losartan (COZAAR) 100 MG tablet Take 100 mg by mouth daily.      . meloxicam (MOBIC) 15 MG tablet Take 15 mg by mouth daily.      Marland Kitchen omeprazole (PRILOSEC) 20 MG capsule Take 1 capsule (20 mg total) by mouth 2 (two) times daily before a meal.  60 capsule  3  . oxybutynin (DITROPAN-XL) 5 MG 24 hr tablet Take 5 mg by mouth at bedtime.      . pantoprazole (PROTONIX) 40 MG tablet Take 1 tablet (40 mg total) by mouth daily.  30 tablet  3  . rOPINIRole (REQUIP) 1 MG tablet Take 1 mg by mouth 3 (three) times daily.      . simvastatin (ZOCOR) 20 MG tablet Take 20 mg by mouth every evening.      . temazepam (RESTORIL) 30 MG capsule Take 30 mg by mouth at bedtime as needed for sleep.        No results found for this or any previous visit (from the past 48 hour(s)). No results found.  ROS  Blood pressure 120/51, pulse 48, temperature 97.7 F (36.5 C), temperature source  Oral, height 5' 4.5" (1.638 m), weight 239 lb (108.41 kg), SpO2 96.00%. Physical Exam  Constitutional: She appears well-developed and well-nourished.  HENT:  Mouth/Throat: Oropharynx is clear and moist.  Eyes: Conjunctivae are normal. No scleral icterus.  Neck: No thyromegaly present.  Cardiovascular: Normal rate, regular rhythm and normal heart sounds.   No murmur heard. Respiratory: Effort normal and breath sounds normal.  GI: Soft. Tenderness: mild tenderness at the right subcostal region lateral to midclavicular line.  Musculoskeletal: She exhibits no edema.  Lymphadenopathy:    She has no cervical adenopathy.  Neurological: She is alert.  Skin: Skin is warm and dry.     Assessment/Plan Chronic GERD. Dysphagia. EGD and ED.  REHMAN,NAJEEB U 03/18/2014, 7:44 AM

## 2014-03-18 NOTE — Telephone Encounter (Signed)
Patient had a procedure this morning. She was prescribed the Carafate liquid. It was going to cost her $90. [facility] was contacted and we ask about the Carafate tablets. The cost was a little over $5.21 Per Dr.Rehman we may call in Carafate Tablets 1 Gram,Patient is to dissolve 2 tablets in water at bedtime and drink. #60 with 1 refill. This was given to [facility] and the patient was made aware.

## 2014-03-21 LAB — H. PYLORI ANTIBODY, IGG: H PYLORI IGG: 0.43 {ISR}

## 2014-03-22 ENCOUNTER — Encounter (HOSPITAL_COMMUNITY): Payer: Self-pay | Admitting: Internal Medicine

## 2014-03-29 DIAGNOSIS — Z23 Encounter for immunization: Secondary | ICD-10-CM | POA: Diagnosis not present

## 2014-05-03 DIAGNOSIS — L219 Seborrheic dermatitis, unspecified: Secondary | ICD-10-CM | POA: Diagnosis not present

## 2014-05-03 DIAGNOSIS — L853 Xerosis cutis: Secondary | ICD-10-CM | POA: Diagnosis not present

## 2014-05-23 DIAGNOSIS — R350 Frequency of micturition: Secondary | ICD-10-CM | POA: Diagnosis not present

## 2014-05-23 DIAGNOSIS — R351 Nocturia: Secondary | ICD-10-CM | POA: Diagnosis not present

## 2014-05-23 DIAGNOSIS — R3915 Urgency of urination: Secondary | ICD-10-CM | POA: Diagnosis not present

## 2014-06-15 ENCOUNTER — Encounter (INDEPENDENT_AMBULATORY_CARE_PROVIDER_SITE_OTHER): Payer: Self-pay | Admitting: *Deleted

## 2014-07-07 ENCOUNTER — Encounter (INDEPENDENT_AMBULATORY_CARE_PROVIDER_SITE_OTHER): Payer: Self-pay

## 2014-07-18 ENCOUNTER — Ambulatory Visit (INDEPENDENT_AMBULATORY_CARE_PROVIDER_SITE_OTHER): Payer: Medicare Other | Admitting: Internal Medicine

## 2014-07-18 ENCOUNTER — Encounter (INDEPENDENT_AMBULATORY_CARE_PROVIDER_SITE_OTHER): Payer: Self-pay | Admitting: Internal Medicine

## 2014-07-18 VITALS — BP 128/82 | HR 64 | Temp 97.7°F | Ht 64.0 in | Wt 249.9 lb

## 2014-07-18 DIAGNOSIS — K219 Gastro-esophageal reflux disease without esophagitis: Secondary | ICD-10-CM

## 2014-07-18 MED ORDER — PANTOPRAZOLE SODIUM 40 MG PO TBEC: 40.0000 mg | DELAYED_RELEASE_TABLET | Freq: Every day | ORAL | Status: DC

## 2014-07-18 NOTE — Patient Instructions (Signed)
Protonix 40mg  daily. OV in 6 months.

## 2014-07-18 NOTE — Progress Notes (Addendum)
Subjective:    Patient ID: Jessica Serrano, female    DOB: 1940-12-19, 74 y.o.   MRN: [id number]  HPI Here today for f/u of her chronic GERD poorly controlled and dysphagia to pills.  She tells me she continues to hurt in her mid-esophagus.  Has been taking  Protonix on a prn basis. She continues to have some problems swallowing medications but not all the time. Appetite is good. No weight loss. She usually has a BM daily. No melena or BRRB.  She has increased fiber in her diet. She is avoiding spicy foods. She avoid chocolates.   03/18/2014 EGD/ED: Dr. Laural Golden: Indications: Patient is a 74 year old Caucasian female with chronic GERD symptoms poorly controlled on double dose omeprazole and better on pantoprazole also complains of postprandial nausea, intermittent right upper quadrant abdominal pain and dysphagia to pills and solids. Impression: Small sliding hiatal hernia with mild changes of reflux esophagitis limited to GE junction without ring or stricture formation. Duodenogastric reflux. No erosive antral gastritis. Esophagus dilated by passing a 54 [place] Maloney dilator. 03/18/2014 H. Pylori negative.  06/15/2010 Colonoscopy: Dr. Sherrlyn Hock.  Blood in stool:Normal. Normal appearing cecum, ileocecal valve, and appendiceal orifice were identified. Few scattered diverticula.( sigmoid colon)  Review of Systems Past Medical History  Diagnosis Date  . High blood pressure   . Depression   . Bell's palsy     right  . Dyspnea   . Neuropathy   . Hemochromatosis   . Osteoarthritis     knees    Past Surgical History  Procedure Laterality Date  . Knee surgery Left   . Lap chole stones    . Gallbladder surgery    . Tonsillectomy and adenoidectomy    . Abdominal hysterectomy    . Elbow surgery    . Shoulder surgery    . Trigger finger release    . Total knee  arthroplasty      1998 left knee  . Cholecystectomy      1998  . Esophagogastroduodenoscopy N/A 03/18/2014    Procedure: ESOPHAGOGASTRODUODENOSCOPY (EGD);  Surgeon: Rogene Houston, MD;  [facility];  Service: Endoscopy;  Laterality: N/A;  730  . Maloney dilation N/A 03/18/2014    Procedure: Venia Minks DILATION;  Surgeon: Rogene Houston, MD;  Location: [facility];  Service: Endoscopy;  Laterality: N/A;    No Known Allergies  Current Outpatient Prescriptions on File Prior to Visit  Medication Sig Dispense Refill  . amLODipine (NORVASC) 2.5 MG tablet Take 2.5 mg by mouth daily.    Marland Kitchen atenolol (TENORMIN) 50 MG tablet Take 50 mg by mouth daily.    . fluticasone (FLONASE) 50 MCG/ACT nasal spray as needed.     Marland Kitchen HYDROcodone-acetaminophen (NORCO) 7.5-325 MG per tablet Take 1 tablet by mouth every 6 (six) hours as needed for moderate pain.    Marland Kitchen losartan (COZAAR) 100 MG tablet Take 100 mg by mouth daily.    . meloxicam (MOBIC) 15 MG tablet Take 15 mg by mouth daily.    Marland Kitchen omeprazole (PRILOSEC) 20 MG capsule Take 1 capsule (20 mg total) by mouth 2 (two) times daily before a meal. 60 capsule 3  . oxybutynin (DITROPAN-XL) 5 MG 24 hr tablet Take 5 mg by mouth at bedtime.    . pantoprazole (PROTONIX) 40 MG tablet Take 1 tablet (40 mg total) by mouth daily. 30 tablet 3  . rOPINIRole (REQUIP) 1 MG tablet Take 1 mg by mouth 3 (three) times daily.    Marland Kitchen  simvastatin (ZOCOR) 20 MG tablet Take 20 mg by mouth every evening.    . sucralfate (CARAFATE) 1 GM/10ML suspension Take 20 mLs (2 g total) by mouth at bedtime. 420 mL 1  . temazepam (RESTORIL) 30 MG capsule Take 30 mg by mouth at bedtime as needed for sleep.     No current facility-administered medications on file prior to visit.        Objective:   Physical ExamBlood pressure 128/82, pulse 64, temperature 97.7 F (36.5 C), height 5\' 4"  (1.626 m), weight 249 lb 14.4 oz (113.354 kg).   Alert and oriented. Skin warm and dry. Oral mucosa is  moist.   . Sclera anicteric, conjunctivae is pink. Thyroid not enlarged. No cervical lymphadenopathy. Lungs clear. Heart regular rate and rhythm.  Abdomen is soft. Bowel sounds are positive. No hepatomegaly. No abdominal masses felt. No tenderness.  No edema to lower extremities.        Assessment & Plan:  GERD. She has been taking Protonix on a prn basis.  GERD diet given to patient.  Nees to be taking  Protonix daily. OV in 6 months.  Needs to lose weight and exercise

## 2014-09-12 DIAGNOSIS — Z0000 Encounter for general adult medical examination without abnormal findings: Secondary | ICD-10-CM | POA: Diagnosis not present

## 2014-09-12 DIAGNOSIS — Z6841 Body Mass Index (BMI) 40.0 and over, adult: Secondary | ICD-10-CM | POA: Diagnosis not present

## 2014-09-12 DIAGNOSIS — E780 Pure hypercholesterolemia: Secondary | ICD-10-CM | POA: Diagnosis not present

## 2014-09-12 DIAGNOSIS — R7301 Impaired fasting glucose: Secondary | ICD-10-CM | POA: Diagnosis not present

## 2014-09-12 DIAGNOSIS — I10 Essential (primary) hypertension: Secondary | ICD-10-CM | POA: Diagnosis not present

## 2014-09-12 DIAGNOSIS — F329 Major depressive disorder, single episode, unspecified: Secondary | ICD-10-CM | POA: Diagnosis not present

## 2014-09-12 DIAGNOSIS — K219 Gastro-esophageal reflux disease without esophagitis: Secondary | ICD-10-CM | POA: Diagnosis not present

## 2014-10-18 DIAGNOSIS — L259 Unspecified contact dermatitis, unspecified cause: Secondary | ICD-10-CM | POA: Diagnosis not present

## 2014-10-18 DIAGNOSIS — L570 Actinic keratosis: Secondary | ICD-10-CM | POA: Diagnosis not present

## 2015-01-16 ENCOUNTER — Ambulatory Visit (INDEPENDENT_AMBULATORY_CARE_PROVIDER_SITE_OTHER): Payer: Medicare Other | Admitting: Internal Medicine

## 2015-01-16 ENCOUNTER — Encounter (INDEPENDENT_AMBULATORY_CARE_PROVIDER_SITE_OTHER): Payer: Self-pay | Admitting: Internal Medicine

## 2015-01-16 VITALS — BP 110/50 | HR 60 | Temp 98.2°F | Ht 64.0 in | Wt 248.7 lb

## 2015-01-16 DIAGNOSIS — K219 Gastro-esophageal reflux disease without esophagitis: Secondary | ICD-10-CM | POA: Diagnosis not present

## 2015-01-16 MED ORDER — OMEPRAZOLE 20 MG PO CPDR: 20.0000 mg | DELAYED_RELEASE_CAPSULE | Freq: Every day | ORAL | Status: DC

## 2015-01-16 NOTE — Patient Instructions (Signed)
OV in 1 year. Rx for Omeprazole 20mg  at his.

## 2015-01-16 NOTE — Progress Notes (Signed)
Subjective:    Patient ID: Jessica Serrano, female    DOB: 1940-10-15, 74 y.o.   MRN: [id number]  HPI Here today for f/u of her GERD. She is presently not taking a PPI for her GERD. Acid reflux is worse at night. When she eats she has some nausea, but no vomiting. Appetite is good. No weight loss.  She usually has a BM about 3 a weeks. No melena or BRRB. Her last colonoscopy was in 2011. She does have some urgency.  She is avoiding spicy foods. She does eat chocolate or the tomato based foods. She has her HOB elevated.     03/18/2014 EGD/ED: Dr. Laural Golden: Indications: Patient is a 74 year old Caucasian female with chronic GERD symptoms poorly controlled on double dose omeprazole and better on pantoprazole also complains of postprandial nausea, intermittent right upper quadrant abdominal pain and dysphagia to pills and solids. Impression: Small sliding hiatal hernia with mild changes of reflux esophagitis limited to GE junction without ring or stricture formation. Duodenogastric reflux. No erosive antral gastritis. Esophagus dilated by passing a 54 [place] Maloney dilator. 03/18/2014 H. Pylori negative.  06/15/2010 Colonoscopy: Dr. Sherrlyn Hock. Blood in stool:Normal. Normal appearing cecum, ileocecal valve, and appendiceal orifice were identified. Few scattered diverticula.( sigmoid colon)   Review of Systems     Past Medical History  Diagnosis Date  . High blood pressure   . Depression   . Bell's palsy     right  . Dyspnea   . Neuropathy   . Hemochromatosis   . Osteoarthritis     knees    Past Surgical History  Procedure Laterality Date  . Knee surgery Left   . Lap chole stones    . Gallbladder surgery    . Tonsillectomy and adenoidectomy    . Abdominal hysterectomy    . Elbow surgery    . Shoulder surgery    . Trigger finger release    . Total knee  arthroplasty      1998 left knee  . Cholecystectomy      1998  . Esophagogastroduodenoscopy N/A 03/18/2014    Procedure: ESOPHAGOGASTRODUODENOSCOPY (EGD);  Surgeon: Rogene Houston, MD;  [facility];  Service: Endoscopy;  Laterality: N/A;  730  . Maloney dilation N/A 03/18/2014    Procedure: Venia Minks DILATION;  Surgeon: Rogene Houston, MD;  Location: [facility];  Service: Endoscopy;  Laterality: N/A;    No Known Allergies  Current Outpatient Prescriptions on File Prior to Visit  Medication Sig Dispense Refill  . atenolol (TENORMIN) 50 MG tablet Take 50 mg by mouth daily.    Marland Kitchen HYDROcodone-acetaminophen (NORCO) 7.5-325 MG per tablet Take 1 tablet by mouth every 6 (six) hours as needed for moderate pain.    Marland Kitchen losartan (COZAAR) 100 MG tablet Take 100 mg by mouth daily.    . meloxicam (MOBIC) 15 MG tablet Take 15 mg by mouth daily.    Marland Kitchen oxybutynin (DITROPAN-XL) 5 MG 24 hr tablet Take 5 mg by mouth at bedtime.    Marland Kitchen rOPINIRole (REQUIP) 1 MG tablet Take 1 mg by mouth 3 (three) times daily.    . simvastatin (ZOCOR) 20 MG tablet Take 20 mg by mouth every evening.    . temazepam (RESTORIL) 30 MG capsule Take 30 mg by mouth at bedtime as needed for sleep.    . pantoprazole (PROTONIX) 40 MG tablet Take 1 tablet (40 mg total) by mouth daily. (Patient not taking: Reported on 01/16/2015) 30 tablet 11  No current facility-administered medications on file prior to visit.     Objective:   Physical Exam Blood pressure 110/50, pulse 60, temperature 98.2 F (36.8 C), height 5\' 4"  (1.626 m), weight 248 lb 11.2 oz (112.81 kg). Alert and oriented. Skin warm and dry. Oral mucosa is moist.   . Sclera anicteric, conjunctivae is pink. Thyroid not enlarged. No cervical lymphadenopathy. Lungs clear. Heart regular rate and rhythm.  Abdomen is soft. Bowel sounds are positive. No hepatomegaly. No abdominal masses felt. No tenderness.  No edema to lower extremities. Patient is alert and oriented.         Assessment & Plan:  GERD. Worse at night. Needs to take a PPI in the pm. Omeprazole 20mg  at his

## 2015-03-31 DIAGNOSIS — Z23 Encounter for immunization: Secondary | ICD-10-CM | POA: Diagnosis not present

## 2015-04-11 DIAGNOSIS — I872 Venous insufficiency (chronic) (peripheral): Secondary | ICD-10-CM | POA: Diagnosis not present

## 2015-04-11 DIAGNOSIS — M25552 Pain in left hip: Secondary | ICD-10-CM | POA: Diagnosis not present

## 2015-04-11 DIAGNOSIS — M25551 Pain in right hip: Secondary | ICD-10-CM | POA: Diagnosis not present

## 2015-04-24 DIAGNOSIS — M1612 Unilateral primary osteoarthritis, left hip: Secondary | ICD-10-CM | POA: Diagnosis not present

## 2015-04-24 DIAGNOSIS — M25562 Pain in left knee: Secondary | ICD-10-CM | POA: Diagnosis not present

## 2015-04-24 DIAGNOSIS — L259 Unspecified contact dermatitis, unspecified cause: Secondary | ICD-10-CM | POA: Diagnosis not present

## 2015-04-24 DIAGNOSIS — Z96652 Presence of left artificial knee joint: Secondary | ICD-10-CM | POA: Diagnosis not present

## 2015-04-24 DIAGNOSIS — L570 Actinic keratosis: Secondary | ICD-10-CM | POA: Diagnosis not present

## 2015-05-29 DIAGNOSIS — R3915 Urgency of urination: Secondary | ICD-10-CM | POA: Diagnosis not present

## 2015-05-29 DIAGNOSIS — R1030 Lower abdominal pain, unspecified: Secondary | ICD-10-CM | POA: Diagnosis not present

## 2015-05-30 DIAGNOSIS — R3915 Urgency of urination: Secondary | ICD-10-CM | POA: Diagnosis not present

## 2015-06-08 DIAGNOSIS — Z96652 Presence of left artificial knee joint: Secondary | ICD-10-CM | POA: Diagnosis not present

## 2015-06-08 DIAGNOSIS — M1612 Unilateral primary osteoarthritis, left hip: Secondary | ICD-10-CM | POA: Diagnosis not present

## 2015-08-08 DIAGNOSIS — R350 Frequency of micturition: Secondary | ICD-10-CM | POA: Diagnosis not present

## 2015-08-08 DIAGNOSIS — R3915 Urgency of urination: Secondary | ICD-10-CM | POA: Diagnosis not present

## 2015-08-09 HISTORY — PX: BREAST SURGERY: SHX581

## 2015-08-10 DIAGNOSIS — Z1231 Encounter for screening mammogram for malignant neoplasm of breast: Secondary | ICD-10-CM | POA: Diagnosis not present

## 2015-08-10 DIAGNOSIS — R921 Mammographic calcification found on diagnostic imaging of breast: Secondary | ICD-10-CM | POA: Diagnosis not present

## 2015-08-23 ENCOUNTER — Other Ambulatory Visit: Payer: Self-pay | Admitting: Family Medicine

## 2015-08-23 DIAGNOSIS — R921 Mammographic calcification found on diagnostic imaging of breast: Secondary | ICD-10-CM

## 2015-08-23 DIAGNOSIS — R928 Other abnormal and inconclusive findings on diagnostic imaging of breast: Secondary | ICD-10-CM | POA: Diagnosis not present

## 2015-09-01 ENCOUNTER — Ambulatory Visit
Admission: RE | Admit: 2015-09-01 | Discharge: 2015-09-01 | Disposition: A | Discharge status: Home / Self Care | Payer: Medicare Other | Source: Ambulatory Visit | Attending: Family Medicine | Admitting: Family Medicine

## 2015-09-01 DIAGNOSIS — R921 Mammographic calcification found on diagnostic imaging of breast: Secondary | ICD-10-CM

## 2015-09-01 DIAGNOSIS — N6012 Diffuse cystic mastopathy of left breast: Secondary | ICD-10-CM | POA: Diagnosis not present

## 2015-09-05 ENCOUNTER — Ambulatory Visit (INDEPENDENT_AMBULATORY_CARE_PROVIDER_SITE_OTHER): Payer: Medicare Other | Admitting: Adult Health

## 2015-09-05 ENCOUNTER — Encounter: Payer: Self-pay | Admitting: Adult Health

## 2015-09-05 VITALS — BP 140/70 | HR 56 | Ht 63.5 in | Wt 238.0 lb

## 2015-09-05 DIAGNOSIS — N898 Other specified noninflammatory disorders of vagina: Secondary | ICD-10-CM | POA: Insufficient documentation

## 2015-09-05 DIAGNOSIS — N952 Postmenopausal atrophic vaginitis: Secondary | ICD-10-CM

## 2015-09-05 DIAGNOSIS — Z8742 Personal history of other diseases of the female genital tract: Secondary | ICD-10-CM

## 2015-09-05 HISTORY — DX: Postmenopausal atrophic vaginitis: N95.2

## 2015-09-05 HISTORY — DX: Personal history of other diseases of the female genital tract: Z87.42

## 2015-09-05 LAB — POCT WET PREP (WET MOUNT): Clue Cells Wet Prep Whiff POC: NEGATIVE

## 2015-09-05 MED ORDER — ESTRADIOL 0.1 MG/GM VA CREA: TOPICAL_CREAM | VAGINAL | Status: DC

## 2015-09-05 NOTE — Patient Instructions (Signed)
Use estrace 1 gm in vagina at hs  Follow up with me 3/15

## 2015-09-05 NOTE — Progress Notes (Signed)
Subjective:     Patient ID: Jessica Serrano, female   DOB: 09/30/1940, 75 y.o.   MRN: [id number]  HPI Jessica Serrano is a 75 year old white female, widowed, in for ?pap, she is sp hysterectomy in 1978 by Dr Marnette Burgess for fibroids and ovarian cysts.She has had ?vaginal bleeding for 2 years, has dark stains on panties and now has white discharge at times, no pain. I told her that she does not need pap after hysterectomy if not performed for cancer. PCP is Dr Scotty Court and Dr Laural Golden is Her GI and Dr Exie Parody is her urologist.   Review of Systems Patient denies any headaches, hearing loss, fatigue, blurred vision, shortness of breath, chest pain, abdominal pain, problems with bowel movements, urination, or intercourse(not having sex). No joint pain or mood swings, see HPI for positives. Reviewed past medical,surgical, social and family history. Reviewed medications and allergies.     Objective:   Physical Exam BP 140/70 mmHg  Pulse 56  Ht 5' 3.5" (1.613 m)  Wt 238 lb (107.956 kg)  BMI 41.49 kg/m2   Skin warm and dry.Pelvic: external genitalia is normal in appearance no lesions, vagina: is pale with loss of moisture and rugae,urethra has no lesions or masses noted, cervix and uterus are absent, adnexa: no masses or tenderness noted. Bladder is non tender and no masses felt. Wet prep: negative, On rectal exam has good tone, has internal hemorrhoids and hemoccult was negative(done at [facility])has mild rectocele, did not see any signs of fistula.Explained that tissues get thin and dry out with age and loss of estrogen, and will add vaginal estrogen to see if helps, did not see any areas that looked like they had bleed. Face time 20 minutes with 50 % counseling.  Assessment:     Vaginal discharge Vaginal atrophy History of vaginal bleeding    Plan:     Given estrace 1 gm in vagina at hs for 2 weeks,72 gms given lot [id number], exp 9/19 Follow up in 2 weeks for recheck

## 2015-09-13 DIAGNOSIS — R7301 Impaired fasting glucose: Secondary | ICD-10-CM | POA: Diagnosis not present

## 2015-09-13 DIAGNOSIS — K219 Gastro-esophageal reflux disease without esophagitis: Secondary | ICD-10-CM | POA: Diagnosis not present

## 2015-09-13 DIAGNOSIS — R928 Other abnormal and inconclusive findings on diagnostic imaging of breast: Secondary | ICD-10-CM | POA: Diagnosis not present

## 2015-09-13 DIAGNOSIS — I10 Essential (primary) hypertension: Secondary | ICD-10-CM | POA: Diagnosis not present

## 2015-09-13 DIAGNOSIS — Z0000 Encounter for general adult medical examination without abnormal findings: Secondary | ICD-10-CM | POA: Diagnosis not present

## 2015-09-13 DIAGNOSIS — E7800 Pure hypercholesterolemia, unspecified: Secondary | ICD-10-CM | POA: Diagnosis not present

## 2015-09-13 DIAGNOSIS — Z6839 Body mass index (BMI) 39.0-39.9, adult: Secondary | ICD-10-CM | POA: Diagnosis not present

## 2015-09-14 DIAGNOSIS — I10 Essential (primary) hypertension: Secondary | ICD-10-CM | POA: Diagnosis not present

## 2015-09-14 DIAGNOSIS — R7301 Impaired fasting glucose: Secondary | ICD-10-CM | POA: Diagnosis not present

## 2015-09-14 DIAGNOSIS — E7800 Pure hypercholesterolemia, unspecified: Secondary | ICD-10-CM | POA: Diagnosis not present

## 2015-09-19 ENCOUNTER — Ambulatory Visit: Payer: Medicare Other | Admitting: Adult Health

## 2015-09-25 ENCOUNTER — Encounter: Payer: Self-pay | Admitting: Adult Health

## 2015-09-25 ENCOUNTER — Ambulatory Visit (INDEPENDENT_AMBULATORY_CARE_PROVIDER_SITE_OTHER): Payer: Medicare Other | Admitting: Adult Health

## 2015-09-25 VITALS — BP 144/80 | HR 62 | Ht 63.5 in | Wt 234.0 lb

## 2015-09-25 DIAGNOSIS — N952 Postmenopausal atrophic vaginitis: Secondary | ICD-10-CM | POA: Diagnosis not present

## 2015-09-25 DIAGNOSIS — R1032 Left lower quadrant pain: Secondary | ICD-10-CM

## 2015-09-25 DIAGNOSIS — R630 Anorexia: Secondary | ICD-10-CM

## 2015-09-25 DIAGNOSIS — R197 Diarrhea, unspecified: Secondary | ICD-10-CM

## 2015-09-25 DIAGNOSIS — R634 Abnormal weight loss: Secondary | ICD-10-CM

## 2015-09-25 HISTORY — DX: Diarrhea, unspecified: R19.7

## 2015-09-25 HISTORY — DX: Anorexia: R63.0

## 2015-09-25 HISTORY — DX: Left lower quadrant pain: R10.32

## 2015-09-25 HISTORY — DX: Abnormal weight loss: R63.4

## 2015-09-25 NOTE — Progress Notes (Signed)
Subjective:     Patient ID: Jessica Serrano, female   DOB: Mar 01, 1941, 75 y.o.   MRN: [id number]  HPI Jessica Serrano is a 75 year old white female, widowed, back in follow up of vaginal atrophy used estrace cream, and she says she feels so much better.She has had weight loss of about 15 lbs in last 3 months, and diarrhea for last 3-4 months and LLQ pain for > 6 months and stool is straight.Has decreased appetite, has told Dr Scotty Court. Had colonoscopy in [place] about 2011.She sees Dr Laural Golden now.   Review of Systems  Patient denies any headaches, hearing loss, fatigue, blurred vision, shortness of breath, chest pain,  problems with urination, or intercourse(not having sex). No mood swings.Has pain in left hip, see HPI for positives. Reviewed past medical,surgical, social and family history. Reviewed medications and allergies.     Objective:   Physical Exam BP 144/80 mmHg  Pulse 62  Ht 5' 3.5" (1.613 m)  Wt 234 lb (106.142 kg)  BMI 40.80 kg/m2   Skin warm and dry.Pelvic: external genitalia is normal in appearance no lesions, vagina:atrophic better color better,urethra has no lesions or masses noted, cervix and uterus are absent, adnexa: no masses, LLQ  tenderness noted over bowel area. Bladder is non tender and no masses felt. Told her she needs to see Dr Laural Golden again, may have diverticulitis and she said she would, probably needs CT, last one was 2015 or so.   Assessment:     Vaginal atrophy Diarrhea Weight loss Decrease appetite  LLQ pain    Plan:     Continue estrace cream 2-3 x weekly  Follow up with Dr Laural Golden, ASAP Follow up with me prn

## 2015-09-25 NOTE — Patient Instructions (Signed)
Use estrace vaginal cream 2-3 x weekly  Follow up prn Call Dr Laural Golden today for appt

## 2015-10-02 ENCOUNTER — Encounter (INDEPENDENT_AMBULATORY_CARE_PROVIDER_SITE_OTHER): Payer: Self-pay | Admitting: *Deleted

## 2015-10-02 ENCOUNTER — Ambulatory Visit (INDEPENDENT_AMBULATORY_CARE_PROVIDER_SITE_OTHER): Payer: Medicare Other | Admitting: Internal Medicine

## 2015-10-02 ENCOUNTER — Encounter (INDEPENDENT_AMBULATORY_CARE_PROVIDER_SITE_OTHER): Payer: Self-pay | Admitting: Internal Medicine

## 2015-10-02 VITALS — BP 160/84 | HR 60 | Temp 98.4°F | Ht 63.0 in | Wt 232.1 lb

## 2015-10-02 DIAGNOSIS — R634 Abnormal weight loss: Secondary | ICD-10-CM

## 2015-10-02 DIAGNOSIS — K921 Melena: Secondary | ICD-10-CM

## 2015-10-02 LAB — CBC WITH DIFFERENTIAL/PLATELET
BASOS PCT: 1 % (ref 0–1)
Basophils Absolute: 0 10*3/uL (ref 0.0–0.1)
EOS ABS: 0 10*3/uL (ref 0.0–0.7)
EOS PCT: 1 % (ref 0–5)
HCT: 42.2 % (ref 36.0–46.0)
Hemoglobin: 14.2 g/dL (ref 12.0–15.0)
Lymphocytes Relative: 29 % (ref 12–46)
Lymphs Abs: 1.2 10*3/uL (ref 0.7–4.0)
MCH: 31.8 pg (ref 26.0–34.0)
MCHC: 33.6 g/dL (ref 30.0–36.0)
MCV: 94.4 fL (ref 78.0–100.0)
MONO ABS: 0.4 10*3/uL (ref 0.1–1.0)
MPV: 11.5 fL (ref 8.6–12.4)
Monocytes Relative: 10 % (ref 3–12)
Neutro Abs: 2.5 10*3/uL (ref 1.7–7.7)
Neutrophils Relative %: 59 % (ref 43–77)
PLATELETS: 191 10*3/uL (ref 150–400)
RBC: 4.47 MIL/uL (ref 3.87–5.11)
RDW: 13.4 % (ref 11.5–15.5)
WBC: 4.2 10*3/uL (ref 4.0–10.5)

## 2015-10-02 NOTE — Patient Instructions (Addendum)
CT abdomen/pelvis with CM, CBC. Stool cards x 3 sent home with patient.  Fiber 4 gm po.

## 2015-10-02 NOTE — Progress Notes (Signed)
Subjective:    Patient ID: Jessica Serrano, female    DOB: [birth year], 75 y.o.   MRN: [id number]  HPI She presents today with c/o diarrhea off an on since last fall. Worse in the last couple of months. She is having a BM daily, but here lately she will go 3-4 days and not have a BM. She has some constipation. She has diarrhea at least twice a week.  She had a BM 3 days ago it was large and hard and black. She says the stools was long.  She has some nausea. She says she passes gas.  She says she saw yellow mucous when she wiped after a BM. Her stools are brown now.  This morning her BM was soft and stringy.  She has had weight loss which was unintentional.  Her appetite is okay.  She does occasionally have some nausea when she has a BM . Nausea is not new. She take Hydrocodone 3-4 times a day.     Weight 248 01/2015 today her weight is 232.1. She has lost 16 pounds.   03/18/2014 EGD/ED: Dr. Laural Golden: Indications:  Patient is a 75 year old Caucasian female with chronic GERD symptoms poorly controlled on double dose omeprazole and better on pantoprazole also complains of postprandial nausea, intermittent right upper quadrant abdominal pain and dysphagia to pills and solids.                                                                                                                    Impression: Small sliding hiatal hernia with mild changes of reflux esophagitis limited to GE junction without ring or stricture formation. Duodenogastric reflux. No erosive antral gastritis. Esophagus dilated by passing a 54 [place] Maloney dilator. 03/18/2014 H. Pylori negative.  06/15/2010 Colonoscopy: Dr. Sherrlyn Hock.  Blood in stool:Normal. Normal appearing cecum, ileocecal valve, and appendiceal orifice were identified. Few scattered diverticula.( sigmoid colon)   Review of Systems Past Medical History  Diagnosis Date  . High blood pressure   . Depression   . Bell's palsy     right  . Dyspnea   .  Neuropathy ([place])   . Hemochromatosis   . Osteoarthritis     knees  . Vaginal atrophy 09/05/2015  . History of vaginal bleeding 09/05/2015  . Diarrhea 09/25/2015  . Weight loss 09/25/2015  . Decrease in appetite 09/25/2015  . LLQ pain 09/25/2015    Past Surgical History  Procedure Laterality Date  . Knee surgery Left   . Lap chole stones    . Gallbladder surgery    . Tonsillectomy and adenoidectomy    . Abdominal hysterectomy    . Elbow surgery    . Shoulder surgery    . Trigger finger release    . Total knee arthroplasty      1998 left knee  . Cholecystectomy      1998  . Esophagogastroduodenoscopy N/A 03/18/2014    Procedure: ESOPHAGOGASTRODUODENOSCOPY (EGD);  Surgeon: Rogene Houston, MD;  [facility];  Service: Endoscopy;  Laterality: N/A;  730  . Maloney dilation N/A 03/18/2014    Procedure: Venia Minks DILATION;  Surgeon: Rogene Houston, MD;  Location: [facility];  Service: Endoscopy;  Laterality: N/A;  . Breast surgery  08/2015    biopsy left breast    No Known Allergies  Current Outpatient Prescriptions on File Prior to Visit  Medication Sig Dispense Refill  . atenolol (TENORMIN) 50 MG tablet Take 50 mg by mouth daily.    Marland Kitchen estradiol (ESTRACE VAGINAL) 0.1 MG/GM vaginal cream Use 1 gm in vagina at HS 72 g 0  . HYDROcodone-acetaminophen (NORCO) 7.5-325 MG per tablet Take 1 tablet by mouth every 6 (six) hours as needed for moderate pain.    Marland Kitchen losartan-hydrochlorothiazide (HYZAAR) 100-12.5 MG tablet 1 tablet daily.     . meloxicam (MOBIC) 15 MG tablet Take 15 mg by mouth daily.    Marland Kitchen omeprazole (PRILOSEC) 20 MG capsule Take 1 capsule (20 mg total) by mouth daily. 30 minutes before supper. (Patient taking differently: Take 20 mg by mouth as needed. 30 minutes before supper.) 90 capsule 3  . oxybutynin (DITROPAN XL) 15 MG 24 hr tablet 15 mg daily.     Marland Kitchen rOPINIRole (REQUIP) 1 MG tablet Take 1 mg by mouth 2 (two) times daily.     . simvastatin (ZOCOR) 20 MG tablet Take  20 mg by mouth every evening.    . temazepam (RESTORIL) 15 MG capsule Take 15 mg by mouth. Takes 2 at bedtime     No current facility-administered medications on file prior to visit.        Objective:   Physical Exam Blood pressure 160/84, pulse 60, temperature 98.4 F (36.9 C), height 5\' 3"  (1.6 m), weight 232 lb 1.6 oz (105.28 kg). Alert and oriented. Skin warm and dry. Oral mucosa is moist.   . Sclera anicteric, conjunctivae is pink. Thyroid not enlarged. No cervical lymphadenopathy. Lungs clear. Heart regular rate and rhythm.  Abdomen is soft. Bowel sounds are positive. No hepatomegaly. No abdominal masses felt. No tenderness.  No edema to lower extremities.  Stool brown and guaiac negative.     Lot [id number] Ex 9/17     Assessment & Plan:  Weight loss. Possible melena. Am going to to get a CT to rule out a malignancy. Fiber 4gms po.  Three stools cards x 3 sent home with patient.

## 2015-10-03 LAB — CREATININE, SERUM: CREATININE: 0.81 mg/dL (ref 0.60–0.93)

## 2015-10-05 ENCOUNTER — Ambulatory Visit (HOSPITAL_COMMUNITY)
Admission: RE | Admit: 2015-10-05 | Discharge: 2015-10-05 | Disposition: A | Discharge status: Home / Self Care | Payer: Medicare Other | Source: Ambulatory Visit | Attending: Internal Medicine | Admitting: Internal Medicine

## 2015-10-05 DIAGNOSIS — I700 Atherosclerosis of aorta: Secondary | ICD-10-CM | POA: Insufficient documentation

## 2015-10-05 DIAGNOSIS — R634 Abnormal weight loss: Secondary | ICD-10-CM | POA: Diagnosis not present

## 2015-10-05 DIAGNOSIS — K921 Melena: Secondary | ICD-10-CM | POA: Diagnosis not present

## 2015-10-05 MED ORDER — IOHEXOL 300 MG/ML  SOLN
100.0000 mL | Freq: Once | INTRAMUSCULAR | Status: AC | PRN
  Administered 2015-10-05: 100 mL via INTRAVENOUS

## 2015-10-05 NOTE — Progress Notes (Signed)
Patient is a difficult stick, use back of hand if possible

## 2015-10-09 ENCOUNTER — Telehealth (INDEPENDENT_AMBULATORY_CARE_PROVIDER_SITE_OTHER): Payer: Self-pay | Admitting: *Deleted

## 2015-10-09 NOTE — Telephone Encounter (Signed)
Patient is returning your call, ph# [phone]

## 2015-10-12 NOTE — Telephone Encounter (Signed)
I have spoke with patient 

## 2015-10-26 ENCOUNTER — Telehealth (INDEPENDENT_AMBULATORY_CARE_PROVIDER_SITE_OTHER): Payer: Self-pay | Admitting: Internal Medicine

## 2015-10-26 NOTE — Telephone Encounter (Signed)
   Diagnosis:    Result(s)   Card 1:  Negative:     Card 2: Negative:   Card 3: Negative:    Completed by: Fletcher Rathbun LPN   HEMOCCULT SENSA DEVELOPER: LOT#:[id number]   EXPIRATION DATE: 9-17   HEMOCCULT SENSA CARD:  LOT#: [id number]  EXPIRATION DATE: 07/18   CARD CONTROL RESULTS:  POSITIVE: Positive NEGATIVE: Negative    ADDITIONAL COMMENTS: Forwarded to [place]  For review,patient not called.

## 2015-10-26 NOTE — Telephone Encounter (Signed)
Patient dropped off hemoccult card.  Patient stated she is still losing weight and she is taking medications as directed, but it doesn't seem to be working.  She stated the best time to reach her is before 10am.  [phone]

## 2015-11-01 NOTE — Telephone Encounter (Signed)
Message left at home 

## 2015-11-01 NOTE — Telephone Encounter (Signed)
No answer at home #

## 2015-11-04 ENCOUNTER — Other Ambulatory Visit (INDEPENDENT_AMBULATORY_CARE_PROVIDER_SITE_OTHER): Payer: Self-pay | Admitting: Internal Medicine

## 2015-11-08 ENCOUNTER — Telehealth (INDEPENDENT_AMBULATORY_CARE_PROVIDER_SITE_OTHER): Payer: Self-pay | Admitting: Internal Medicine

## 2015-11-08 NOTE — Telephone Encounter (Signed)
Results have been given to patient 

## 2015-11-08 NOTE — Telephone Encounter (Signed)
Patient called, stated that she missed Terri's call regarding results.  She'd like a call back after 4pm or you can leave a message on the machine if she's not there.  [phone]

## 2015-11-08 NOTE — Telephone Encounter (Signed)
Results given to patient. She will keep a stool diary.   Hope, OV in 8 weeks.

## 2015-11-13 NOTE — Telephone Encounter (Signed)
Patient was called and given an appointment for 01/03/16 at 2:15pm

## 2015-11-22 DIAGNOSIS — M19021 Primary osteoarthritis, right elbow: Secondary | ICD-10-CM | POA: Diagnosis not present

## 2015-11-22 DIAGNOSIS — S43431A Superior glenoid labrum lesion of right shoulder, initial encounter: Secondary | ICD-10-CM | POA: Diagnosis not present

## 2015-11-22 DIAGNOSIS — L989 Disorder of the skin and subcutaneous tissue, unspecified: Secondary | ICD-10-CM | POA: Diagnosis not present

## 2015-11-27 DIAGNOSIS — M19019 Primary osteoarthritis, unspecified shoulder: Secondary | ICD-10-CM | POA: Diagnosis not present

## 2015-11-27 DIAGNOSIS — M6740 Ganglion, unspecified site: Secondary | ICD-10-CM | POA: Diagnosis not present

## 2016-01-03 ENCOUNTER — Ambulatory Visit (INDEPENDENT_AMBULATORY_CARE_PROVIDER_SITE_OTHER): Payer: Medicare Other | Admitting: Internal Medicine

## 2016-01-03 ENCOUNTER — Encounter (INDEPENDENT_AMBULATORY_CARE_PROVIDER_SITE_OTHER): Payer: Self-pay | Admitting: Internal Medicine

## 2016-01-03 VITALS — BP 140/60 | HR 60 | Temp 98.5°F | Ht 64.0 in | Wt 224.8 lb

## 2016-01-03 DIAGNOSIS — R197 Diarrhea, unspecified: Secondary | ICD-10-CM | POA: Diagnosis not present

## 2016-01-03 LAB — CBC WITH DIFFERENTIAL/PLATELET
BASOS ABS: 0 {cells}/uL (ref 0–200)
BASOS PCT: 0 %
Eosinophils Absolute: 47 cells/uL (ref 15–500)
Eosinophils Relative: 1 %
HEMATOCRIT: 42.1 % (ref 35.0–45.0)
HEMOGLOBIN: 14.2 g/dL (ref 11.7–15.5)
LYMPHS PCT: 34 %
Lymphs Abs: 1598 cells/uL (ref 850–3900)
MCH: 32 pg (ref 27.0–33.0)
MCHC: 33.7 g/dL (ref 32.0–36.0)
MCV: 94.8 fL (ref 80.0–100.0)
MONO ABS: 376 {cells}/uL (ref 200–950)
MPV: 12.1 fL (ref 7.5–12.5)
Monocytes Relative: 8 %
NEUTROS PCT: 57 %
Neutro Abs: 2679 cells/uL (ref 1500–7800)
Platelets: 190 10*3/uL (ref 140–400)
RBC: 4.44 MIL/uL (ref 3.80–5.10)
RDW: 12.9 % (ref 11.0–15.0)
WBC: 4.7 10*3/uL (ref 3.8–10.8)

## 2016-01-03 NOTE — Progress Notes (Addendum)
Subjective:    Patient ID: Jessica Serrano, female    DOB: [birth year], 75 y.o.   MRN: [id number]  HPI Here today for f/u. She was seen in March with c/o diarrhea off and on since the fall. Hx of constipation. She also c/o of having a large, hard,black stool at that visit.   Her stool was guaiac negative in office 10/02/2015. All three stool card that were sent home with her were negative. She also told me her weight loss was  unintentional.  Her last weight in March was 232. Today her weight is 224.8. She did keep a stool diary. Most of her stools were okay. Per diary, she has had 3 episodes of diarrhea.  She leans more toward constipation.  When she has a BM, her BMs are soft. She usually has a BM about every 2-3 day and then she will have a BM daily.  Sometimes she says she cannot eat because of nausea. She can eat crackers. She tries to take Citracel daily.       Weight 248 01/2015 today her weight is 232.1. She has lost 16 pounds.    10/05/2015 CT abdomen/pelvis with CM: weight loss, constipation: IMPRESSION: 1. No acute abdominal/pelvic findings, mass lesions or lymphadenopathy. 2. Moderate stool throughout the colon may suggest constipation. No inflammatory changes, mass lesions or obstructive findings. 3. Moderate to advanced atherosclerotic calcifications involving the aorta and branch vessels.    CBC    Component Value Date/Time   WBC 4.2 10/02/2015 1047   RBC 4.47 10/02/2015 1047   HGB 14.2 10/02/2015 1047   HCT 42.2 10/02/2015 1047   PLT 191 10/02/2015 1047   MCV 94.4 10/02/2015 1047   MCH 31.8 10/02/2015 1047   MCHC 33.6 10/02/2015 1047   RDW 13.4 10/02/2015 1047   LYMPHSABS 1.2 10/02/2015 1047   MONOABS 0.4 10/02/2015 1047   EOSABS 0.0 10/02/2015 1047   BASOSABS 0.0 10/02/2015 1047         03/18/2014 EGD/ED: Dr. Laural Golden: Indications: Patient is a 75 year old Caucasian female with chronic GERD symptoms poorly controlled on double dose omeprazole and  better on pantoprazole also complains of postprandial nausea, intermittent right upper quadrant abdominal pain and dysphagia to pills and solids. Impression: Small sliding hiatal hernia with mild changes of reflux esophagitis limited to GE junction without ring or stricture formation. Duodenogastric reflux. No erosive antral gastritis. Esophagus dilated by passing a 54 [place] Maloney dilator. 03/18/2014 H. Pylori negative.  06/15/2010 Colonoscopy: Dr. Sherrlyn Hock. Blood in stool:Normal. Normal appearing cecum, ileocecal valve, and appendiceal orifice were identified. Few scattered diverticula.( sigmoid colon)  Review of Systems Past Medical History  Diagnosis Date  . High blood pressure   . Depression   . Bell's palsy     right  . Dyspnea   . Neuropathy ([place])   . Hemochromatosis   . Osteoarthritis     knees  . Vaginal atrophy 09/05/2015  . History of vaginal bleeding 09/05/2015  . Diarrhea 09/25/2015  . Weight loss 09/25/2015  . Decrease in appetite 09/25/2015  . LLQ pain 09/25/2015    Past Surgical History  Procedure Laterality Date  . Knee surgery Left   . Lap chole stones    . Gallbladder surgery    . Tonsillectomy and adenoidectomy    . Abdominal hysterectomy    . Elbow surgery    . Shoulder surgery    . Trigger finger release    . Total knee arthroplasty  1998 left knee  . Cholecystectomy      1998  . Esophagogastroduodenoscopy N/A 03/18/2014    Procedure: ESOPHAGOGASTRODUODENOSCOPY (EGD);  Surgeon: Rogene Houston, MD;  [facility];  Service: Endoscopy;  Laterality: N/A;  730  . Maloney dilation N/A 03/18/2014    Procedure: Venia Minks DILATION;  Surgeon: Rogene Houston, MD;  [facility]: [facility];  Service: Endoscopy;  Laterality: N/A;  . Breast surgery  08/2015    biopsy left breast    No Known Allergies  Current Outpatient  Prescriptions on File Prior to Visit  Medication Sig Dispense Refill  . atenolol (TENORMIN) 50 MG tablet Take 50 mg by mouth daily.    Marland Kitchen estradiol (ESTRACE VAGINAL) 0.1 MG/GM vaginal cream Use 1 gm in vagina at HS 72 g 0  . HYDROcodone-acetaminophen (NORCO) 7.5-325 MG per tablet Take 1 tablet by mouth every 6 (six) hours as needed for moderate pain.    Marland Kitchen losartan-hydrochlorothiazide (HYZAAR) 100-12.5 MG tablet 1 tablet daily.     . meloxicam (MOBIC) 15 MG tablet Take 15 mg by mouth daily.    Marland Kitchen omeprazole (PRILOSEC) 20 MG capsule TAKE 1 CAP BY MOUTH ONCE DAILY 30 MINUTES BEFORE SUPPER 90 capsule 2  . oxybutynin (DITROPAN XL) 15 MG 24 hr tablet 15 mg daily.     Marland Kitchen rOPINIRole (REQUIP) 1 MG tablet Take 1 mg by mouth 2 (two) times daily.     . simvastatin (ZOCOR) 20 MG tablet Take 20 mg by mouth every evening.    . temazepam (RESTORIL) 15 MG capsule Take 15 mg by mouth. Takes 2 at bedtime     No current facility-administered medications on file prior to visit.        Objective:   Physical Exam Blood pressure 140/60, pulse 60, temperature 98.5 F (36.9 C), height 5\' 4"  (1.626 m), weight 224 lb 12.8 oz (101.969 kg). Alert and oriented. Skin warm and dry. Oral mucosa is moist.   . Sclera anicteric, conjunctivae is pink. Thyroid not enlarged. No cervical lymphadenopathy. Lungs clear. Heart regular rate and rhythm.  Abdomen is soft. Bowel sounds are positive. No hepatomegaly. No abdominal masses felt. No tenderness.  No edema to lower extremities.         Assessment & Plan:  Diarrhea, weight loss. All stools cards were negative. Will recheck a CBC today. OV in 3 months.

## 2016-01-03 NOTE — Patient Instructions (Signed)
CBC today. OV in 3 months.  

## 2016-02-13 DIAGNOSIS — R350 Frequency of micturition: Secondary | ICD-10-CM | POA: Diagnosis not present

## 2016-02-13 DIAGNOSIS — R3915 Urgency of urination: Secondary | ICD-10-CM | POA: Diagnosis not present

## 2016-03-13 DIAGNOSIS — Z23 Encounter for immunization: Secondary | ICD-10-CM | POA: Diagnosis not present

## 2016-03-20 DIAGNOSIS — M25551 Pain in right hip: Secondary | ICD-10-CM | POA: Diagnosis not present

## 2016-03-20 DIAGNOSIS — I10 Essential (primary) hypertension: Secondary | ICD-10-CM | POA: Diagnosis not present

## 2016-03-20 DIAGNOSIS — I739 Peripheral vascular disease, unspecified: Secondary | ICD-10-CM | POA: Diagnosis not present

## 2016-03-20 DIAGNOSIS — R202 Paresthesia of skin: Secondary | ICD-10-CM | POA: Diagnosis not present

## 2016-03-20 DIAGNOSIS — M25552 Pain in left hip: Secondary | ICD-10-CM | POA: Diagnosis not present

## 2016-03-25 DIAGNOSIS — I739 Peripheral vascular disease, unspecified: Secondary | ICD-10-CM | POA: Diagnosis not present

## 2016-03-25 DIAGNOSIS — Z87891 Personal history of nicotine dependence: Secondary | ICD-10-CM | POA: Diagnosis not present

## 2016-03-25 DIAGNOSIS — I10 Essential (primary) hypertension: Secondary | ICD-10-CM | POA: Diagnosis not present

## 2016-03-25 DIAGNOSIS — E785 Hyperlipidemia, unspecified: Secondary | ICD-10-CM | POA: Diagnosis not present

## 2016-04-04 ENCOUNTER — Encounter (INDEPENDENT_AMBULATORY_CARE_PROVIDER_SITE_OTHER): Payer: Self-pay | Admitting: Internal Medicine

## 2016-04-04 ENCOUNTER — Ambulatory Visit (INDEPENDENT_AMBULATORY_CARE_PROVIDER_SITE_OTHER): Payer: Medicare Other | Admitting: Internal Medicine

## 2016-04-04 VITALS — BP 150/72 | HR 72 | Temp 98.5°F | Ht 64.0 in | Wt 224.9 lb

## 2016-04-04 DIAGNOSIS — K219 Gastro-esophageal reflux disease without esophagitis: Secondary | ICD-10-CM

## 2016-04-04 NOTE — Patient Instructions (Signed)
Continue the Omeprazole.  OV in 1 year.  

## 2016-04-04 NOTE — Progress Notes (Signed)
Subjective:    Patient ID: Jessica Serrano, female    DOB: 1941/05/20, 75 y.o.   MRN: [id number]  HPI Here today for f/u. She was last seen in June of this year with c/o diarrhea. Hx of constipation. 3 stool cards sent home with patient and all were negative.  She tells me she is doing good. Sometimes she has acid reflux especially if she lies down at night. Sometimes she has diarrhea, which is not often.  Appetite is okay. She has maintained her weight. She says foods does not have any taste.  Weight in June 224.8   CBC    Component Value Date/Time   WBC 4.7 01/03/2016 1458   RBC 4.44 01/03/2016 1458   HGB 14.2 01/03/2016 1458   HCT 42.1 01/03/2016 1458   PLT 190 01/03/2016 1458   MCV 94.8 01/03/2016 1458   MCH 32.0 01/03/2016 1458   MCHC 33.7 01/03/2016 1458   RDW 12.9 01/03/2016 1458   LYMPHSABS 1,598 01/03/2016 1458   MONOABS 376 01/03/2016 1458   EOSABS 47 01/03/2016 1458   BASOSABS 0 01/03/2016 1458       06/15/2010 Colonoscopy: Dr. Sherrlyn Hock. Blood in stool:Normal. Normal appearing cecum, ileocecal valve, and appendiceal orifice were identified. Few scattered diverticula.( sigmoid colon) Review of Systems Past Medical History:  Diagnosis Date  . Bell's palsy    right  . Decrease in appetite 09/25/2015  . Depression   . Diarrhea 09/25/2015  . Dyspnea   . Hemochromatosis   . High blood pressure   . History of vaginal bleeding 09/05/2015  . LLQ pain 09/25/2015  . Neuropathy ([place])   . Osteoarthritis    knees  . Vaginal atrophy 09/05/2015  . Weight loss 09/25/2015    Past Surgical History:  Procedure Laterality Date  . ABDOMINAL HYSTERECTOMY    . BREAST SURGERY  08/2015   biopsy left breast  . CHOLECYSTECTOMY     1998  . ELBOW SURGERY    . ESOPHAGOGASTRODUODENOSCOPY N/A 03/18/2014   Procedure: ESOPHAGOGASTRODUODENOSCOPY (EGD);  Surgeon: Rogene Houston, MD;  [facility];  Service: Endoscopy;  Laterality: N/A;  730  . GALLBLADDER SURGERY      . KNEE SURGERY Left   . lap chole stones    . MALONEY DILATION N/A 03/18/2014   Procedure: Venia Minks DILATION;  Surgeon: Rogene Houston, MD;  [facility];  Service: Endoscopy;  Laterality: N/A;  . SHOULDER SURGERY    . TONSILLECTOMY AND ADENOIDECTOMY    . TOTAL KNEE ARTHROPLASTY     1998 left knee  . TRIGGER FINGER RELEASE      No Known Allergies  Current Outpatient Prescriptions on File Prior to Visit  Medication Sig Dispense Refill  . atenolol (TENORMIN) 50 MG tablet Take 50 mg by mouth daily.    Marland Kitchen estradiol (ESTRACE VAGINAL) 0.1 MG/GM vaginal cream Use 1 gm in vagina at HS 72 g 0  . HYDROcodone-acetaminophen (NORCO) 7.5-325 MG per tablet Take 1 tablet by mouth every 6 (six) hours as needed for moderate pain.    Marland Kitchen losartan-hydrochlorothiazide (HYZAAR) 100-12.5 MG tablet 1 tablet daily.     . meloxicam (MOBIC) 15 MG tablet Take 15 mg by mouth daily.    Marland Kitchen omeprazole (PRILOSEC) 20 MG capsule TAKE 1 CAP BY MOUTH ONCE DAILY 30 MINUTES BEFORE SUPPER 90 capsule 2  . oxybutynin (DITROPAN XL) 15 MG 24 hr tablet 15 mg daily.     Marland Kitchen rOPINIRole (REQUIP) 1 MG tablet  Take 1 mg by mouth 2 (two) times daily.     . simvastatin (ZOCOR) 20 MG tablet Take 20 mg by mouth every evening.    . temazepam (RESTORIL) 15 MG capsule Take 15 mg by mouth. Takes 2 at bedtime     No current facility-administered medications on file prior to visit.        Objective:   Physical Exam Blood pressure (!) 150/72, pulse 72, temperature 98.5 F (36.9 C), height 5\' 4"  (1.626 m), weight 224 lb 14.4 oz (102 kg). Alert and oriented. Skin warm and dry. Oral mucosa is moist.   . Sclera anicteric, conjunctivae is pink. Thyroid not enlarged. No cervical lymphadenopathy. Lungs clear. Heart regular rate and rhythm.  Abdomen is soft. Bowel sounds are positive. No hepatomegaly. No abdominal masses felt. No tenderness.  No edema to lower extremities.          Assessment & Plan:  GERD. Continue to Omeprazole. OV in  year.

## 2016-04-11 DIAGNOSIS — M48061 Spinal stenosis, lumbar region without neurogenic claudication: Secondary | ICD-10-CM | POA: Diagnosis not present

## 2016-04-11 DIAGNOSIS — M5126 Other intervertebral disc displacement, lumbar region: Secondary | ICD-10-CM | POA: Diagnosis not present

## 2016-04-23 DIAGNOSIS — D1800 Hemangioma unspecified site: Secondary | ICD-10-CM | POA: Diagnosis not present

## 2016-04-23 DIAGNOSIS — L570 Actinic keratosis: Secondary | ICD-10-CM | POA: Diagnosis not present

## 2016-04-23 DIAGNOSIS — L259 Unspecified contact dermatitis, unspecified cause: Secondary | ICD-10-CM | POA: Diagnosis not present

## 2016-06-14 DIAGNOSIS — M4310 Spondylolisthesis, site unspecified: Secondary | ICD-10-CM | POA: Diagnosis not present

## 2016-06-14 DIAGNOSIS — M47816 Spondylosis without myelopathy or radiculopathy, lumbar region: Secondary | ICD-10-CM | POA: Diagnosis not present

## 2016-07-08 HISTORY — PX: BACK SURGERY: SHX140

## 2016-07-22 DIAGNOSIS — M5416 Radiculopathy, lumbar region: Secondary | ICD-10-CM | POA: Diagnosis not present

## 2016-07-22 DIAGNOSIS — I10 Essential (primary) hypertension: Secondary | ICD-10-CM | POA: Diagnosis not present

## 2016-07-22 DIAGNOSIS — R3915 Urgency of urination: Secondary | ICD-10-CM | POA: Diagnosis not present

## 2016-07-22 DIAGNOSIS — M489 Spondylopathy, unspecified: Secondary | ICD-10-CM | POA: Diagnosis not present

## 2016-08-08 DIAGNOSIS — Z01818 Encounter for other preprocedural examination: Secondary | ICD-10-CM | POA: Diagnosis not present

## 2016-08-08 DIAGNOSIS — I10 Essential (primary) hypertension: Secondary | ICD-10-CM | POA: Diagnosis not present

## 2016-08-08 DIAGNOSIS — I517 Cardiomegaly: Secondary | ICD-10-CM | POA: Diagnosis not present

## 2016-08-08 DIAGNOSIS — R918 Other nonspecific abnormal finding of lung field: Secondary | ICD-10-CM | POA: Diagnosis not present

## 2016-08-08 DIAGNOSIS — R0602 Shortness of breath: Secondary | ICD-10-CM | POA: Diagnosis not present

## 2016-08-08 DIAGNOSIS — M47816 Spondylosis without myelopathy or radiculopathy, lumbar region: Secondary | ICD-10-CM | POA: Diagnosis not present

## 2016-08-08 DIAGNOSIS — Z79899 Other long term (current) drug therapy: Secondary | ICD-10-CM | POA: Diagnosis not present

## 2016-08-08 DIAGNOSIS — K219 Gastro-esophageal reflux disease without esophagitis: Secondary | ICD-10-CM | POA: Diagnosis not present

## 2016-08-08 DIAGNOSIS — E669 Obesity, unspecified: Secondary | ICD-10-CM | POA: Diagnosis not present

## 2016-08-12 DIAGNOSIS — Z96652 Presence of left artificial knee joint: Secondary | ICD-10-CM | POA: Diagnosis present

## 2016-08-12 DIAGNOSIS — M6281 Muscle weakness (generalized): Secondary | ICD-10-CM | POA: Diagnosis not present

## 2016-08-12 DIAGNOSIS — J029 Acute pharyngitis, unspecified: Secondary | ICD-10-CM | POA: Diagnosis not present

## 2016-08-12 DIAGNOSIS — E785 Hyperlipidemia, unspecified: Secondary | ICD-10-CM | POA: Diagnosis not present

## 2016-08-12 DIAGNOSIS — K219 Gastro-esophageal reflux disease without esophagitis: Secondary | ICD-10-CM | POA: Diagnosis present

## 2016-08-12 DIAGNOSIS — M47816 Spondylosis without myelopathy or radiculopathy, lumbar region: Secondary | ICD-10-CM | POA: Diagnosis not present

## 2016-08-12 DIAGNOSIS — M1990 Unspecified osteoarthritis, unspecified site: Secondary | ICD-10-CM | POA: Diagnosis present

## 2016-08-12 DIAGNOSIS — I10 Essential (primary) hypertension: Secondary | ICD-10-CM | POA: Diagnosis not present

## 2016-08-12 DIAGNOSIS — E784 Other hyperlipidemia: Secondary | ICD-10-CM | POA: Diagnosis not present

## 2016-08-12 DIAGNOSIS — I493 Ventricular premature depolarization: Secondary | ICD-10-CM | POA: Diagnosis not present

## 2016-08-12 DIAGNOSIS — M4316 Spondylolisthesis, lumbar region: Secondary | ICD-10-CM | POA: Diagnosis present

## 2016-08-12 DIAGNOSIS — Z79899 Other long term (current) drug therapy: Secondary | ICD-10-CM | POA: Diagnosis not present

## 2016-08-12 DIAGNOSIS — Z981 Arthrodesis status: Secondary | ICD-10-CM | POA: Diagnosis not present

## 2016-08-12 DIAGNOSIS — Z6837 Body mass index (BMI) 37.0-37.9, adult: Secondary | ICD-10-CM | POA: Diagnosis not present

## 2016-08-12 DIAGNOSIS — K210 Gastro-esophageal reflux disease with esophagitis: Secondary | ICD-10-CM | POA: Diagnosis not present

## 2016-08-12 DIAGNOSIS — Z87891 Personal history of nicotine dependence: Secondary | ICD-10-CM | POA: Diagnosis not present

## 2016-08-12 DIAGNOSIS — R739 Hyperglycemia, unspecified: Secondary | ICD-10-CM | POA: Diagnosis not present

## 2016-08-12 DIAGNOSIS — N39498 Other specified urinary incontinence: Secondary | ICD-10-CM | POA: Diagnosis not present

## 2016-08-12 DIAGNOSIS — R2681 Unsteadiness on feet: Secondary | ICD-10-CM | POA: Diagnosis not present

## 2016-08-12 DIAGNOSIS — E669 Obesity, unspecified: Secondary | ICD-10-CM | POA: Diagnosis present

## 2016-08-12 DIAGNOSIS — R918 Other nonspecific abnormal finding of lung field: Secondary | ICD-10-CM | POA: Diagnosis present

## 2016-08-12 DIAGNOSIS — R52 Pain, unspecified: Secondary | ICD-10-CM | POA: Diagnosis not present

## 2016-08-12 DIAGNOSIS — F329 Major depressive disorder, single episode, unspecified: Secondary | ICD-10-CM | POA: Diagnosis not present

## 2016-08-12 DIAGNOSIS — E7800 Pure hypercholesterolemia, unspecified: Secondary | ICD-10-CM | POA: Diagnosis present

## 2016-08-12 DIAGNOSIS — M4326 Fusion of spine, lumbar region: Secondary | ICD-10-CM | POA: Diagnosis not present

## 2016-08-12 DIAGNOSIS — Z79891 Long term (current) use of opiate analgesic: Secondary | ICD-10-CM | POA: Diagnosis not present

## 2016-08-12 DIAGNOSIS — G4700 Insomnia, unspecified: Secondary | ICD-10-CM | POA: Diagnosis not present

## 2016-08-12 DIAGNOSIS — Z4789 Encounter for other orthopedic aftercare: Secondary | ICD-10-CM | POA: Diagnosis not present

## 2016-08-12 DIAGNOSIS — M4310 Spondylolisthesis, site unspecified: Secondary | ICD-10-CM | POA: Diagnosis not present

## 2016-08-15 DIAGNOSIS — R52 Pain, unspecified: Secondary | ICD-10-CM | POA: Diagnosis not present

## 2016-08-15 DIAGNOSIS — R2681 Unsteadiness on feet: Secondary | ICD-10-CM | POA: Diagnosis not present

## 2016-08-15 DIAGNOSIS — M79604 Pain in right leg: Secondary | ICD-10-CM | POA: Diagnosis not present

## 2016-08-15 DIAGNOSIS — E784 Other hyperlipidemia: Secondary | ICD-10-CM | POA: Diagnosis not present

## 2016-08-15 DIAGNOSIS — M4316 Spondylolisthesis, lumbar region: Secondary | ICD-10-CM | POA: Diagnosis not present

## 2016-08-15 DIAGNOSIS — N39498 Other specified urinary incontinence: Secondary | ICD-10-CM | POA: Diagnosis not present

## 2016-08-15 DIAGNOSIS — K219 Gastro-esophageal reflux disease without esophagitis: Secondary | ICD-10-CM | POA: Diagnosis not present

## 2016-08-15 DIAGNOSIS — M4326 Fusion of spine, lumbar region: Secondary | ICD-10-CM | POA: Diagnosis not present

## 2016-08-15 DIAGNOSIS — M7989 Other specified soft tissue disorders: Secondary | ICD-10-CM | POA: Diagnosis not present

## 2016-08-15 DIAGNOSIS — E785 Hyperlipidemia, unspecified: Secondary | ICD-10-CM | POA: Diagnosis not present

## 2016-08-15 DIAGNOSIS — M1990 Unspecified osteoarthritis, unspecified site: Secondary | ICD-10-CM | POA: Diagnosis not present

## 2016-08-15 DIAGNOSIS — G4700 Insomnia, unspecified: Secondary | ICD-10-CM | POA: Diagnosis not present

## 2016-08-15 DIAGNOSIS — M47816 Spondylosis without myelopathy or radiculopathy, lumbar region: Secondary | ICD-10-CM | POA: Diagnosis not present

## 2016-08-15 DIAGNOSIS — I10 Essential (primary) hypertension: Secondary | ICD-10-CM | POA: Diagnosis not present

## 2016-08-15 DIAGNOSIS — M6281 Muscle weakness (generalized): Secondary | ICD-10-CM | POA: Diagnosis not present

## 2016-08-15 DIAGNOSIS — Z4789 Encounter for other orthopedic aftercare: Secondary | ICD-10-CM | POA: Diagnosis not present

## 2016-08-15 DIAGNOSIS — Z981 Arthrodesis status: Secondary | ICD-10-CM | POA: Diagnosis not present

## 2016-08-15 DIAGNOSIS — K210 Gastro-esophageal reflux disease with esophagitis: Secondary | ICD-10-CM | POA: Diagnosis not present

## 2016-08-21 DIAGNOSIS — M7989 Other specified soft tissue disorders: Secondary | ICD-10-CM | POA: Diagnosis not present

## 2016-08-21 DIAGNOSIS — M79604 Pain in right leg: Secondary | ICD-10-CM | POA: Diagnosis not present

## 2016-08-28 ENCOUNTER — Other Ambulatory Visit (INDEPENDENT_AMBULATORY_CARE_PROVIDER_SITE_OTHER): Payer: Self-pay | Admitting: Internal Medicine

## 2016-09-07 DIAGNOSIS — Z4789 Encounter for other orthopedic aftercare: Secondary | ICD-10-CM | POA: Diagnosis not present

## 2016-09-07 DIAGNOSIS — M1990 Unspecified osteoarthritis, unspecified site: Secondary | ICD-10-CM | POA: Diagnosis not present

## 2016-09-07 DIAGNOSIS — Z981 Arthrodesis status: Secondary | ICD-10-CM | POA: Diagnosis not present

## 2016-09-07 DIAGNOSIS — I10 Essential (primary) hypertension: Secondary | ICD-10-CM | POA: Diagnosis not present

## 2016-09-07 DIAGNOSIS — Z87891 Personal history of nicotine dependence: Secondary | ICD-10-CM | POA: Diagnosis not present

## 2016-09-07 DIAGNOSIS — R2689 Other abnormalities of gait and mobility: Secondary | ICD-10-CM | POA: Diagnosis not present

## 2016-09-07 DIAGNOSIS — Z96652 Presence of left artificial knee joint: Secondary | ICD-10-CM | POA: Diagnosis not present

## 2016-09-07 DIAGNOSIS — F419 Anxiety disorder, unspecified: Secondary | ICD-10-CM | POA: Diagnosis not present

## 2016-09-07 DIAGNOSIS — M4316 Spondylolisthesis, lumbar region: Secondary | ICD-10-CM | POA: Diagnosis not present

## 2016-09-07 DIAGNOSIS — G2581 Restless legs syndrome: Secondary | ICD-10-CM | POA: Diagnosis not present

## 2016-09-09 DIAGNOSIS — Z4789 Encounter for other orthopedic aftercare: Secondary | ICD-10-CM | POA: Diagnosis not present

## 2016-09-09 DIAGNOSIS — M4316 Spondylolisthesis, lumbar region: Secondary | ICD-10-CM | POA: Diagnosis not present

## 2016-09-09 DIAGNOSIS — I10 Essential (primary) hypertension: Secondary | ICD-10-CM | POA: Diagnosis not present

## 2016-09-09 DIAGNOSIS — R2689 Other abnormalities of gait and mobility: Secondary | ICD-10-CM | POA: Diagnosis not present

## 2016-09-09 DIAGNOSIS — M1990 Unspecified osteoarthritis, unspecified site: Secondary | ICD-10-CM | POA: Diagnosis not present

## 2016-09-10 DIAGNOSIS — Z981 Arthrodesis status: Secondary | ICD-10-CM | POA: Diagnosis not present

## 2016-09-10 DIAGNOSIS — I700 Atherosclerosis of aorta: Secondary | ICD-10-CM | POA: Diagnosis not present

## 2016-09-10 DIAGNOSIS — I10 Essential (primary) hypertension: Secondary | ICD-10-CM | POA: Diagnosis not present

## 2016-09-10 DIAGNOSIS — M47816 Spondylosis without myelopathy or radiculopathy, lumbar region: Secondary | ICD-10-CM | POA: Diagnosis not present

## 2016-09-10 DIAGNOSIS — M5136 Other intervertebral disc degeneration, lumbar region: Secondary | ICD-10-CM | POA: Diagnosis not present

## 2016-09-10 DIAGNOSIS — Z4789 Encounter for other orthopedic aftercare: Secondary | ICD-10-CM | POA: Diagnosis not present

## 2016-09-10 DIAGNOSIS — R2689 Other abnormalities of gait and mobility: Secondary | ICD-10-CM | POA: Diagnosis not present

## 2016-09-10 DIAGNOSIS — R918 Other nonspecific abnormal finding of lung field: Secondary | ICD-10-CM | POA: Diagnosis not present

## 2016-09-10 DIAGNOSIS — M4316 Spondylolisthesis, lumbar region: Secondary | ICD-10-CM | POA: Diagnosis not present

## 2016-09-10 DIAGNOSIS — M1990 Unspecified osteoarthritis, unspecified site: Secondary | ICD-10-CM | POA: Diagnosis not present

## 2016-09-10 DIAGNOSIS — M4326 Fusion of spine, lumbar region: Secondary | ICD-10-CM | POA: Diagnosis not present

## 2016-09-11 DIAGNOSIS — I10 Essential (primary) hypertension: Secondary | ICD-10-CM | POA: Diagnosis not present

## 2016-09-11 DIAGNOSIS — M4316 Spondylolisthesis, lumbar region: Secondary | ICD-10-CM | POA: Diagnosis not present

## 2016-09-11 DIAGNOSIS — M1990 Unspecified osteoarthritis, unspecified site: Secondary | ICD-10-CM | POA: Diagnosis not present

## 2016-09-11 DIAGNOSIS — R2689 Other abnormalities of gait and mobility: Secondary | ICD-10-CM | POA: Diagnosis not present

## 2016-09-11 DIAGNOSIS — Z4789 Encounter for other orthopedic aftercare: Secondary | ICD-10-CM | POA: Diagnosis not present

## 2016-09-12 DIAGNOSIS — Z4789 Encounter for other orthopedic aftercare: Secondary | ICD-10-CM | POA: Diagnosis not present

## 2016-09-12 DIAGNOSIS — M1990 Unspecified osteoarthritis, unspecified site: Secondary | ICD-10-CM | POA: Diagnosis not present

## 2016-09-12 DIAGNOSIS — I10 Essential (primary) hypertension: Secondary | ICD-10-CM | POA: Diagnosis not present

## 2016-09-12 DIAGNOSIS — M4316 Spondylolisthesis, lumbar region: Secondary | ICD-10-CM | POA: Diagnosis not present

## 2016-09-12 DIAGNOSIS — R2689 Other abnormalities of gait and mobility: Secondary | ICD-10-CM | POA: Diagnosis not present

## 2016-09-17 DIAGNOSIS — R2689 Other abnormalities of gait and mobility: Secondary | ICD-10-CM | POA: Diagnosis not present

## 2016-09-17 DIAGNOSIS — M1990 Unspecified osteoarthritis, unspecified site: Secondary | ICD-10-CM | POA: Diagnosis not present

## 2016-09-17 DIAGNOSIS — M4316 Spondylolisthesis, lumbar region: Secondary | ICD-10-CM | POA: Diagnosis not present

## 2016-09-17 DIAGNOSIS — I10 Essential (primary) hypertension: Secondary | ICD-10-CM | POA: Diagnosis not present

## 2016-09-17 DIAGNOSIS — Z4789 Encounter for other orthopedic aftercare: Secondary | ICD-10-CM | POA: Diagnosis not present

## 2016-09-18 DIAGNOSIS — R110 Nausea: Secondary | ICD-10-CM | POA: Diagnosis not present

## 2016-09-18 DIAGNOSIS — I872 Venous insufficiency (chronic) (peripheral): Secondary | ICD-10-CM | POA: Diagnosis not present

## 2016-09-18 DIAGNOSIS — R600 Localized edema: Secondary | ICD-10-CM | POA: Diagnosis not present

## 2016-09-18 DIAGNOSIS — I10 Essential (primary) hypertension: Secondary | ICD-10-CM | POA: Diagnosis not present

## 2016-09-19 DIAGNOSIS — R2689 Other abnormalities of gait and mobility: Secondary | ICD-10-CM | POA: Diagnosis not present

## 2016-09-19 DIAGNOSIS — I10 Essential (primary) hypertension: Secondary | ICD-10-CM | POA: Diagnosis not present

## 2016-09-19 DIAGNOSIS — M4316 Spondylolisthesis, lumbar region: Secondary | ICD-10-CM | POA: Diagnosis not present

## 2016-09-19 DIAGNOSIS — Z4789 Encounter for other orthopedic aftercare: Secondary | ICD-10-CM | POA: Diagnosis not present

## 2016-09-19 DIAGNOSIS — M1990 Unspecified osteoarthritis, unspecified site: Secondary | ICD-10-CM | POA: Diagnosis not present

## 2016-09-24 DIAGNOSIS — M1990 Unspecified osteoarthritis, unspecified site: Secondary | ICD-10-CM | POA: Diagnosis not present

## 2016-09-24 DIAGNOSIS — M4316 Spondylolisthesis, lumbar region: Secondary | ICD-10-CM | POA: Diagnosis not present

## 2016-09-24 DIAGNOSIS — R2689 Other abnormalities of gait and mobility: Secondary | ICD-10-CM | POA: Diagnosis not present

## 2016-09-24 DIAGNOSIS — I10 Essential (primary) hypertension: Secondary | ICD-10-CM | POA: Diagnosis not present

## 2016-09-24 DIAGNOSIS — Z4789 Encounter for other orthopedic aftercare: Secondary | ICD-10-CM | POA: Diagnosis not present

## 2016-09-26 DIAGNOSIS — I10 Essential (primary) hypertension: Secondary | ICD-10-CM | POA: Diagnosis not present

## 2016-09-26 DIAGNOSIS — R2689 Other abnormalities of gait and mobility: Secondary | ICD-10-CM | POA: Diagnosis not present

## 2016-09-26 DIAGNOSIS — M4316 Spondylolisthesis, lumbar region: Secondary | ICD-10-CM | POA: Diagnosis not present

## 2016-09-26 DIAGNOSIS — M1990 Unspecified osteoarthritis, unspecified site: Secondary | ICD-10-CM | POA: Diagnosis not present

## 2016-09-26 DIAGNOSIS — Z4789 Encounter for other orthopedic aftercare: Secondary | ICD-10-CM | POA: Diagnosis not present

## 2016-09-30 DIAGNOSIS — Z4789 Encounter for other orthopedic aftercare: Secondary | ICD-10-CM | POA: Diagnosis not present

## 2016-09-30 DIAGNOSIS — I10 Essential (primary) hypertension: Secondary | ICD-10-CM | POA: Diagnosis not present

## 2016-09-30 DIAGNOSIS — R2689 Other abnormalities of gait and mobility: Secondary | ICD-10-CM | POA: Diagnosis not present

## 2016-09-30 DIAGNOSIS — M1990 Unspecified osteoarthritis, unspecified site: Secondary | ICD-10-CM | POA: Diagnosis not present

## 2016-09-30 DIAGNOSIS — M4316 Spondylolisthesis, lumbar region: Secondary | ICD-10-CM | POA: Diagnosis not present

## 2016-10-01 DIAGNOSIS — R2689 Other abnormalities of gait and mobility: Secondary | ICD-10-CM | POA: Diagnosis not present

## 2016-10-01 DIAGNOSIS — M4316 Spondylolisthesis, lumbar region: Secondary | ICD-10-CM | POA: Diagnosis not present

## 2016-10-01 DIAGNOSIS — M1990 Unspecified osteoarthritis, unspecified site: Secondary | ICD-10-CM | POA: Diagnosis not present

## 2016-10-01 DIAGNOSIS — Z4789 Encounter for other orthopedic aftercare: Secondary | ICD-10-CM | POA: Diagnosis not present

## 2016-10-01 DIAGNOSIS — I10 Essential (primary) hypertension: Secondary | ICD-10-CM | POA: Diagnosis not present

## 2016-10-02 DIAGNOSIS — R911 Solitary pulmonary nodule: Secondary | ICD-10-CM | POA: Diagnosis not present

## 2016-10-02 DIAGNOSIS — J439 Emphysema, unspecified: Secondary | ICD-10-CM | POA: Diagnosis not present

## 2016-10-02 DIAGNOSIS — I700 Atherosclerosis of aorta: Secondary | ICD-10-CM | POA: Diagnosis not present

## 2016-10-02 DIAGNOSIS — R918 Other nonspecific abnormal finding of lung field: Secondary | ICD-10-CM | POA: Diagnosis not present

## 2016-10-02 DIAGNOSIS — I2510 Atherosclerotic heart disease of native coronary artery without angina pectoris: Secondary | ICD-10-CM | POA: Diagnosis not present

## 2016-10-03 DIAGNOSIS — M1990 Unspecified osteoarthritis, unspecified site: Secondary | ICD-10-CM | POA: Diagnosis not present

## 2016-10-03 DIAGNOSIS — R2689 Other abnormalities of gait and mobility: Secondary | ICD-10-CM | POA: Diagnosis not present

## 2016-10-03 DIAGNOSIS — Z4789 Encounter for other orthopedic aftercare: Secondary | ICD-10-CM | POA: Diagnosis not present

## 2016-10-03 DIAGNOSIS — M4316 Spondylolisthesis, lumbar region: Secondary | ICD-10-CM | POA: Diagnosis not present

## 2016-10-03 DIAGNOSIS — I10 Essential (primary) hypertension: Secondary | ICD-10-CM | POA: Diagnosis not present

## 2016-10-04 DIAGNOSIS — M1990 Unspecified osteoarthritis, unspecified site: Secondary | ICD-10-CM | POA: Diagnosis not present

## 2016-10-04 DIAGNOSIS — R2689 Other abnormalities of gait and mobility: Secondary | ICD-10-CM | POA: Diagnosis not present

## 2016-10-04 DIAGNOSIS — Z4789 Encounter for other orthopedic aftercare: Secondary | ICD-10-CM | POA: Diagnosis not present

## 2016-10-04 DIAGNOSIS — I10 Essential (primary) hypertension: Secondary | ICD-10-CM | POA: Diagnosis not present

## 2016-10-04 DIAGNOSIS — M4316 Spondylolisthesis, lumbar region: Secondary | ICD-10-CM | POA: Diagnosis not present

## 2016-10-07 DIAGNOSIS — M1990 Unspecified osteoarthritis, unspecified site: Secondary | ICD-10-CM | POA: Diagnosis not present

## 2016-10-07 DIAGNOSIS — M4316 Spondylolisthesis, lumbar region: Secondary | ICD-10-CM | POA: Diagnosis not present

## 2016-10-07 DIAGNOSIS — I10 Essential (primary) hypertension: Secondary | ICD-10-CM | POA: Diagnosis not present

## 2016-10-07 DIAGNOSIS — Z4789 Encounter for other orthopedic aftercare: Secondary | ICD-10-CM | POA: Diagnosis not present

## 2016-10-07 DIAGNOSIS — R2689 Other abnormalities of gait and mobility: Secondary | ICD-10-CM | POA: Diagnosis not present

## 2016-10-10 DIAGNOSIS — I10 Essential (primary) hypertension: Secondary | ICD-10-CM | POA: Diagnosis not present

## 2016-10-10 DIAGNOSIS — R2689 Other abnormalities of gait and mobility: Secondary | ICD-10-CM | POA: Diagnosis not present

## 2016-10-10 DIAGNOSIS — Z4789 Encounter for other orthopedic aftercare: Secondary | ICD-10-CM | POA: Diagnosis not present

## 2016-10-10 DIAGNOSIS — M4316 Spondylolisthesis, lumbar region: Secondary | ICD-10-CM | POA: Diagnosis not present

## 2016-10-10 DIAGNOSIS — M1990 Unspecified osteoarthritis, unspecified site: Secondary | ICD-10-CM | POA: Diagnosis not present

## 2016-10-29 DIAGNOSIS — H43393 Other vitreous opacities, bilateral: Secondary | ICD-10-CM | POA: Diagnosis not present

## 2016-11-18 DIAGNOSIS — M47816 Spondylosis without myelopathy or radiculopathy, lumbar region: Secondary | ICD-10-CM | POA: Diagnosis not present

## 2016-11-20 DIAGNOSIS — R0781 Pleurodynia: Secondary | ICD-10-CM | POA: Diagnosis not present

## 2016-11-20 DIAGNOSIS — I872 Venous insufficiency (chronic) (peripheral): Secondary | ICD-10-CM | POA: Diagnosis not present

## 2016-11-20 DIAGNOSIS — I10 Essential (primary) hypertension: Secondary | ICD-10-CM | POA: Diagnosis not present

## 2016-11-21 DIAGNOSIS — M47816 Spondylosis without myelopathy or radiculopathy, lumbar region: Secondary | ICD-10-CM | POA: Diagnosis not present

## 2016-11-26 DIAGNOSIS — M47816 Spondylosis without myelopathy or radiculopathy, lumbar region: Secondary | ICD-10-CM | POA: Diagnosis not present

## 2016-11-28 DIAGNOSIS — M47816 Spondylosis without myelopathy or radiculopathy, lumbar region: Secondary | ICD-10-CM | POA: Diagnosis not present

## 2016-12-03 DIAGNOSIS — M47816 Spondylosis without myelopathy or radiculopathy, lumbar region: Secondary | ICD-10-CM | POA: Diagnosis not present

## 2016-12-05 DIAGNOSIS — M47816 Spondylosis without myelopathy or radiculopathy, lumbar region: Secondary | ICD-10-CM | POA: Diagnosis not present

## 2016-12-10 DIAGNOSIS — M47816 Spondylosis without myelopathy or radiculopathy, lumbar region: Secondary | ICD-10-CM | POA: Diagnosis not present

## 2016-12-12 DIAGNOSIS — M47816 Spondylosis without myelopathy or radiculopathy, lumbar region: Secondary | ICD-10-CM | POA: Diagnosis not present

## 2016-12-17 DIAGNOSIS — M47816 Spondylosis without myelopathy or radiculopathy, lumbar region: Secondary | ICD-10-CM | POA: Diagnosis not present

## 2016-12-18 DIAGNOSIS — M1612 Unilateral primary osteoarthritis, left hip: Secondary | ICD-10-CM | POA: Diagnosis not present

## 2016-12-19 DIAGNOSIS — M47816 Spondylosis without myelopathy or radiculopathy, lumbar region: Secondary | ICD-10-CM | POA: Diagnosis not present

## 2016-12-24 DIAGNOSIS — M47816 Spondylosis without myelopathy or radiculopathy, lumbar region: Secondary | ICD-10-CM | POA: Diagnosis not present

## 2016-12-26 DIAGNOSIS — M47816 Spondylosis without myelopathy or radiculopathy, lumbar region: Secondary | ICD-10-CM | POA: Diagnosis not present

## 2016-12-31 DIAGNOSIS — M47816 Spondylosis without myelopathy or radiculopathy, lumbar region: Secondary | ICD-10-CM | POA: Diagnosis not present

## 2017-01-02 DIAGNOSIS — M47816 Spondylosis without myelopathy or radiculopathy, lumbar region: Secondary | ICD-10-CM | POA: Diagnosis not present

## 2017-01-07 DIAGNOSIS — M47816 Spondylosis without myelopathy or radiculopathy, lumbar region: Secondary | ICD-10-CM | POA: Diagnosis not present

## 2017-01-09 DIAGNOSIS — M47816 Spondylosis without myelopathy or radiculopathy, lumbar region: Secondary | ICD-10-CM | POA: Diagnosis not present

## 2017-01-14 DIAGNOSIS — M47816 Spondylosis without myelopathy or radiculopathy, lumbar region: Secondary | ICD-10-CM | POA: Diagnosis not present

## 2017-01-28 DIAGNOSIS — M47816 Spondylosis without myelopathy or radiculopathy, lumbar region: Secondary | ICD-10-CM | POA: Diagnosis not present

## 2017-02-04 DIAGNOSIS — M47816 Spondylosis without myelopathy or radiculopathy, lumbar region: Secondary | ICD-10-CM | POA: Diagnosis not present

## 2017-02-21 DIAGNOSIS — M869 Osteomyelitis, unspecified: Secondary | ICD-10-CM | POA: Diagnosis not present

## 2017-02-21 DIAGNOSIS — M19072 Primary osteoarthritis, left ankle and foot: Secondary | ICD-10-CM | POA: Diagnosis not present

## 2017-03-03 DIAGNOSIS — I10 Essential (primary) hypertension: Secondary | ICD-10-CM | POA: Diagnosis not present

## 2017-03-03 DIAGNOSIS — R7301 Impaired fasting glucose: Secondary | ICD-10-CM | POA: Diagnosis not present

## 2017-03-03 DIAGNOSIS — I872 Venous insufficiency (chronic) (peripheral): Secondary | ICD-10-CM | POA: Diagnosis not present

## 2017-03-03 DIAGNOSIS — E7800 Pure hypercholesterolemia, unspecified: Secondary | ICD-10-CM | POA: Diagnosis not present

## 2017-03-07 DIAGNOSIS — Z23 Encounter for immunization: Secondary | ICD-10-CM | POA: Diagnosis not present

## 2017-04-07 DIAGNOSIS — I83893 Varicose veins of bilateral lower extremities with other complications: Secondary | ICD-10-CM | POA: Diagnosis not present

## 2017-04-07 DIAGNOSIS — I8311 Varicose veins of right lower extremity with inflammation: Secondary | ICD-10-CM | POA: Diagnosis not present

## 2017-04-07 DIAGNOSIS — I8312 Varicose veins of left lower extremity with inflammation: Secondary | ICD-10-CM | POA: Diagnosis not present

## 2017-04-23 DIAGNOSIS — L259 Unspecified contact dermatitis, unspecified cause: Secondary | ICD-10-CM | POA: Diagnosis not present

## 2017-04-23 DIAGNOSIS — L853 Xerosis cutis: Secondary | ICD-10-CM | POA: Diagnosis not present

## 2017-04-23 DIAGNOSIS — D1800 Hemangioma unspecified site: Secondary | ICD-10-CM | POA: Diagnosis not present

## 2017-04-23 DIAGNOSIS — L659 Nonscarring hair loss, unspecified: Secondary | ICD-10-CM | POA: Diagnosis not present

## 2017-04-25 DIAGNOSIS — I8312 Varicose veins of left lower extremity with inflammation: Secondary | ICD-10-CM | POA: Diagnosis not present

## 2017-04-25 DIAGNOSIS — I8311 Varicose veins of right lower extremity with inflammation: Secondary | ICD-10-CM | POA: Diagnosis not present

## 2017-05-08 DIAGNOSIS — I83813 Varicose veins of bilateral lower extremities with pain: Secondary | ICD-10-CM | POA: Diagnosis not present

## 2017-05-20 DIAGNOSIS — M4310 Spondylolisthesis, site unspecified: Secondary | ICD-10-CM | POA: Diagnosis not present

## 2017-05-20 DIAGNOSIS — M47816 Spondylosis without myelopathy or radiculopathy, lumbar region: Secondary | ICD-10-CM | POA: Diagnosis not present

## 2017-05-21 DIAGNOSIS — M47812 Spondylosis without myelopathy or radiculopathy, cervical region: Secondary | ICD-10-CM | POA: Diagnosis not present

## 2017-05-21 DIAGNOSIS — Z9889 Other specified postprocedural states: Secondary | ICD-10-CM | POA: Diagnosis not present

## 2017-05-21 DIAGNOSIS — M47816 Spondylosis without myelopathy or radiculopathy, lumbar region: Secondary | ICD-10-CM | POA: Diagnosis not present

## 2017-05-21 DIAGNOSIS — M419 Scoliosis, unspecified: Secondary | ICD-10-CM | POA: Diagnosis not present

## 2017-05-21 DIAGNOSIS — M4310 Spondylolisthesis, site unspecified: Secondary | ICD-10-CM | POA: Diagnosis not present

## 2017-06-11 DIAGNOSIS — M47816 Spondylosis without myelopathy or radiculopathy, lumbar region: Secondary | ICD-10-CM | POA: Diagnosis not present

## 2017-06-11 DIAGNOSIS — M4310 Spondylolisthesis, site unspecified: Secondary | ICD-10-CM | POA: Diagnosis not present

## 2017-07-25 DIAGNOSIS — I10 Essential (primary) hypertension: Secondary | ICD-10-CM | POA: Diagnosis not present

## 2017-07-25 DIAGNOSIS — Z6839 Body mass index (BMI) 39.0-39.9, adult: Secondary | ICD-10-CM | POA: Diagnosis not present

## 2017-07-25 DIAGNOSIS — G2581 Restless legs syndrome: Secondary | ICD-10-CM | POA: Diagnosis not present

## 2017-07-25 DIAGNOSIS — J449 Chronic obstructive pulmonary disease, unspecified: Secondary | ICD-10-CM | POA: Diagnosis not present

## 2017-07-25 DIAGNOSIS — G4700 Insomnia, unspecified: Secondary | ICD-10-CM | POA: Diagnosis not present

## 2017-07-25 DIAGNOSIS — G6289 Other specified polyneuropathies: Secondary | ICD-10-CM | POA: Diagnosis not present

## 2017-07-25 DIAGNOSIS — E782 Mixed hyperlipidemia: Secondary | ICD-10-CM | POA: Diagnosis not present

## 2017-07-25 DIAGNOSIS — E6609 Other obesity due to excess calories: Secondary | ICD-10-CM | POA: Diagnosis not present

## 2017-07-28 DIAGNOSIS — I83813 Varicose veins of bilateral lower extremities with pain: Secondary | ICD-10-CM | POA: Diagnosis not present

## 2017-07-28 DIAGNOSIS — I83893 Varicose veins of bilateral lower extremities with other complications: Secondary | ICD-10-CM | POA: Diagnosis not present

## 2017-08-01 DIAGNOSIS — Z23 Encounter for immunization: Secondary | ICD-10-CM | POA: Diagnosis not present

## 2017-08-08 DIAGNOSIS — Z1231 Encounter for screening mammogram for malignant neoplasm of breast: Secondary | ICD-10-CM | POA: Diagnosis not present

## 2017-09-09 DIAGNOSIS — M47816 Spondylosis without myelopathy or radiculopathy, lumbar region: Secondary | ICD-10-CM | POA: Diagnosis not present

## 2017-09-09 DIAGNOSIS — M4310 Spondylolisthesis, site unspecified: Secondary | ICD-10-CM | POA: Diagnosis not present

## 2017-09-09 DIAGNOSIS — M489 Spondylopathy, unspecified: Secondary | ICD-10-CM | POA: Diagnosis not present

## 2017-09-11 DIAGNOSIS — M47816 Spondylosis without myelopathy or radiculopathy, lumbar region: Secondary | ICD-10-CM | POA: Diagnosis not present

## 2017-09-11 DIAGNOSIS — Z981 Arthrodesis status: Secondary | ICD-10-CM | POA: Diagnosis not present

## 2017-09-11 DIAGNOSIS — M545 Low back pain: Secondary | ICD-10-CM | POA: Diagnosis not present

## 2017-10-09 DIAGNOSIS — M47816 Spondylosis without myelopathy or radiculopathy, lumbar region: Secondary | ICD-10-CM | POA: Diagnosis not present

## 2017-10-09 DIAGNOSIS — M4310 Spondylolisthesis, site unspecified: Secondary | ICD-10-CM | POA: Diagnosis not present

## 2017-11-21 DIAGNOSIS — I10 Essential (primary) hypertension: Secondary | ICD-10-CM | POA: Diagnosis not present

## 2017-11-21 DIAGNOSIS — J449 Chronic obstructive pulmonary disease, unspecified: Secondary | ICD-10-CM | POA: Diagnosis not present

## 2017-11-21 DIAGNOSIS — G4700 Insomnia, unspecified: Secondary | ICD-10-CM | POA: Diagnosis not present

## 2017-11-21 DIAGNOSIS — E6609 Other obesity due to excess calories: Secondary | ICD-10-CM | POA: Diagnosis not present

## 2017-11-21 DIAGNOSIS — E782 Mixed hyperlipidemia: Secondary | ICD-10-CM | POA: Diagnosis not present

## 2017-11-25 DIAGNOSIS — Z9189 Other specified personal risk factors, not elsewhere classified: Secondary | ICD-10-CM | POA: Diagnosis not present

## 2017-11-25 DIAGNOSIS — Z1389 Encounter for screening for other disorder: Secondary | ICD-10-CM | POA: Diagnosis not present

## 2017-11-25 DIAGNOSIS — M1712 Unilateral primary osteoarthritis, left knee: Secondary | ICD-10-CM | POA: Diagnosis not present

## 2017-11-25 DIAGNOSIS — I10 Essential (primary) hypertension: Secondary | ICD-10-CM | POA: Diagnosis not present

## 2017-11-25 DIAGNOSIS — M1711 Unilateral primary osteoarthritis, right knee: Secondary | ICD-10-CM | POA: Diagnosis not present

## 2017-11-25 DIAGNOSIS — Z0001 Encounter for general adult medical examination with abnormal findings: Secondary | ICD-10-CM | POA: Diagnosis not present

## 2017-11-25 DIAGNOSIS — E782 Mixed hyperlipidemia: Secondary | ICD-10-CM | POA: Diagnosis not present

## 2017-11-25 DIAGNOSIS — Z1331 Encounter for screening for depression: Secondary | ICD-10-CM | POA: Diagnosis not present

## 2017-11-25 DIAGNOSIS — M545 Low back pain: Secondary | ICD-10-CM | POA: Diagnosis not present

## 2018-01-13 DIAGNOSIS — I700 Atherosclerosis of aorta: Secondary | ICD-10-CM | POA: Diagnosis not present

## 2018-01-13 DIAGNOSIS — M419 Scoliosis, unspecified: Secondary | ICD-10-CM | POA: Diagnosis not present

## 2018-01-13 DIAGNOSIS — R160 Hepatomegaly, not elsewhere classified: Secondary | ICD-10-CM | POA: Diagnosis not present

## 2018-01-13 DIAGNOSIS — Z981 Arthrodesis status: Secondary | ICD-10-CM | POA: Diagnosis not present

## 2018-01-13 DIAGNOSIS — M489 Spondylopathy, unspecified: Secondary | ICD-10-CM | POA: Diagnosis not present

## 2018-01-13 DIAGNOSIS — M160 Bilateral primary osteoarthritis of hip: Secondary | ICD-10-CM | POA: Diagnosis not present

## 2018-01-13 DIAGNOSIS — M25552 Pain in left hip: Secondary | ICD-10-CM | POA: Diagnosis not present

## 2018-02-12 DIAGNOSIS — M47816 Spondylosis without myelopathy or radiculopathy, lumbar region: Secondary | ICD-10-CM | POA: Diagnosis not present

## 2018-02-12 DIAGNOSIS — M4310 Spondylolisthesis, site unspecified: Secondary | ICD-10-CM | POA: Diagnosis not present

## 2018-04-01 DIAGNOSIS — M47816 Spondylosis without myelopathy or radiculopathy, lumbar region: Secondary | ICD-10-CM | POA: Diagnosis not present

## 2018-04-01 DIAGNOSIS — Z9889 Other specified postprocedural states: Secondary | ICD-10-CM | POA: Diagnosis not present

## 2018-04-01 DIAGNOSIS — M48061 Spinal stenosis, lumbar region without neurogenic claudication: Secondary | ICD-10-CM | POA: Diagnosis not present

## 2018-04-01 DIAGNOSIS — M5126 Other intervertebral disc displacement, lumbar region: Secondary | ICD-10-CM | POA: Diagnosis not present

## 2018-04-01 DIAGNOSIS — Z23 Encounter for immunization: Secondary | ICD-10-CM | POA: Diagnosis not present

## 2018-04-01 DIAGNOSIS — M5127 Other intervertebral disc displacement, lumbosacral region: Secondary | ICD-10-CM | POA: Diagnosis not present

## 2018-04-01 DIAGNOSIS — M4310 Spondylolisthesis, site unspecified: Secondary | ICD-10-CM | POA: Diagnosis not present

## 2018-04-01 DIAGNOSIS — M5136 Other intervertebral disc degeneration, lumbar region: Secondary | ICD-10-CM | POA: Diagnosis not present

## 2018-04-06 DIAGNOSIS — R739 Hyperglycemia, unspecified: Secondary | ICD-10-CM | POA: Diagnosis not present

## 2018-04-06 DIAGNOSIS — E782 Mixed hyperlipidemia: Secondary | ICD-10-CM | POA: Diagnosis not present

## 2018-04-06 DIAGNOSIS — D509 Iron deficiency anemia, unspecified: Secondary | ICD-10-CM | POA: Diagnosis not present

## 2018-04-06 DIAGNOSIS — Z9189 Other specified personal risk factors, not elsewhere classified: Secondary | ICD-10-CM | POA: Diagnosis not present

## 2018-04-06 DIAGNOSIS — J449 Chronic obstructive pulmonary disease, unspecified: Secondary | ICD-10-CM | POA: Diagnosis not present

## 2018-04-06 DIAGNOSIS — I10 Essential (primary) hypertension: Secondary | ICD-10-CM | POA: Diagnosis not present

## 2018-04-09 DIAGNOSIS — E782 Mixed hyperlipidemia: Secondary | ICD-10-CM | POA: Diagnosis not present

## 2018-04-09 DIAGNOSIS — Z6839 Body mass index (BMI) 39.0-39.9, adult: Secondary | ICD-10-CM | POA: Diagnosis not present

## 2018-04-09 DIAGNOSIS — J449 Chronic obstructive pulmonary disease, unspecified: Secondary | ICD-10-CM | POA: Diagnosis not present

## 2018-04-09 DIAGNOSIS — I10 Essential (primary) hypertension: Secondary | ICD-10-CM | POA: Diagnosis not present

## 2018-04-09 DIAGNOSIS — M19012 Primary osteoarthritis, left shoulder: Secondary | ICD-10-CM | POA: Diagnosis not present

## 2018-04-09 DIAGNOSIS — M19011 Primary osteoarthritis, right shoulder: Secondary | ICD-10-CM | POA: Diagnosis not present

## 2018-04-09 DIAGNOSIS — G6289 Other specified polyneuropathies: Secondary | ICD-10-CM | POA: Diagnosis not present

## 2018-04-09 DIAGNOSIS — G2581 Restless legs syndrome: Secondary | ICD-10-CM | POA: Diagnosis not present

## 2018-04-20 ENCOUNTER — Other Ambulatory Visit: Payer: Self-pay

## 2018-04-20 DIAGNOSIS — Z8739 Personal history of other diseases of the musculoskeletal system and connective tissue: Secondary | ICD-10-CM

## 2018-04-22 DIAGNOSIS — D1801 Hemangioma of skin and subcutaneous tissue: Secondary | ICD-10-CM | POA: Diagnosis not present

## 2018-04-22 DIAGNOSIS — L570 Actinic keratosis: Secondary | ICD-10-CM | POA: Diagnosis not present

## 2018-04-22 DIAGNOSIS — L853 Xerosis cutis: Secondary | ICD-10-CM | POA: Diagnosis not present

## 2018-04-28 DIAGNOSIS — M25612 Stiffness of left shoulder, not elsewhere classified: Secondary | ICD-10-CM | POA: Diagnosis not present

## 2018-04-28 DIAGNOSIS — M25511 Pain in right shoulder: Secondary | ICD-10-CM | POA: Diagnosis not present

## 2018-04-28 DIAGNOSIS — M25611 Stiffness of right shoulder, not elsewhere classified: Secondary | ICD-10-CM | POA: Diagnosis not present

## 2018-04-28 DIAGNOSIS — M25512 Pain in left shoulder: Secondary | ICD-10-CM | POA: Diagnosis not present

## 2018-04-29 DIAGNOSIS — M25612 Stiffness of left shoulder, not elsewhere classified: Secondary | ICD-10-CM | POA: Diagnosis not present

## 2018-04-29 DIAGNOSIS — M25611 Stiffness of right shoulder, not elsewhere classified: Secondary | ICD-10-CM | POA: Diagnosis not present

## 2018-04-29 DIAGNOSIS — M25512 Pain in left shoulder: Secondary | ICD-10-CM | POA: Diagnosis not present

## 2018-04-29 DIAGNOSIS — M25511 Pain in right shoulder: Secondary | ICD-10-CM | POA: Diagnosis not present

## 2018-04-30 DIAGNOSIS — M25512 Pain in left shoulder: Secondary | ICD-10-CM | POA: Diagnosis not present

## 2018-04-30 DIAGNOSIS — M25611 Stiffness of right shoulder, not elsewhere classified: Secondary | ICD-10-CM | POA: Diagnosis not present

## 2018-04-30 DIAGNOSIS — M25511 Pain in right shoulder: Secondary | ICD-10-CM | POA: Diagnosis not present

## 2018-04-30 DIAGNOSIS — M25612 Stiffness of left shoulder, not elsewhere classified: Secondary | ICD-10-CM | POA: Diagnosis not present

## 2018-05-04 DIAGNOSIS — M25512 Pain in left shoulder: Secondary | ICD-10-CM | POA: Diagnosis not present

## 2018-05-04 DIAGNOSIS — M25612 Stiffness of left shoulder, not elsewhere classified: Secondary | ICD-10-CM | POA: Diagnosis not present

## 2018-05-04 DIAGNOSIS — M25511 Pain in right shoulder: Secondary | ICD-10-CM | POA: Diagnosis not present

## 2018-05-04 DIAGNOSIS — M25611 Stiffness of right shoulder, not elsewhere classified: Secondary | ICD-10-CM | POA: Diagnosis not present

## 2018-05-05 DIAGNOSIS — M25511 Pain in right shoulder: Secondary | ICD-10-CM | POA: Diagnosis not present

## 2018-05-05 DIAGNOSIS — M25512 Pain in left shoulder: Secondary | ICD-10-CM | POA: Diagnosis not present

## 2018-05-05 DIAGNOSIS — M25611 Stiffness of right shoulder, not elsewhere classified: Secondary | ICD-10-CM | POA: Diagnosis not present

## 2018-05-05 DIAGNOSIS — M25612 Stiffness of left shoulder, not elsewhere classified: Secondary | ICD-10-CM | POA: Diagnosis not present

## 2018-05-08 DIAGNOSIS — M25612 Stiffness of left shoulder, not elsewhere classified: Secondary | ICD-10-CM | POA: Diagnosis not present

## 2018-05-08 DIAGNOSIS — M25611 Stiffness of right shoulder, not elsewhere classified: Secondary | ICD-10-CM | POA: Diagnosis not present

## 2018-05-08 DIAGNOSIS — M25511 Pain in right shoulder: Secondary | ICD-10-CM | POA: Diagnosis not present

## 2018-05-08 DIAGNOSIS — M25512 Pain in left shoulder: Secondary | ICD-10-CM | POA: Diagnosis not present

## 2018-05-09 DIAGNOSIS — L600 Ingrowing nail: Secondary | ICD-10-CM | POA: Diagnosis not present

## 2018-05-09 DIAGNOSIS — Z6841 Body Mass Index (BMI) 40.0 and over, adult: Secondary | ICD-10-CM | POA: Diagnosis not present

## 2018-05-11 DIAGNOSIS — M25611 Stiffness of right shoulder, not elsewhere classified: Secondary | ICD-10-CM | POA: Diagnosis not present

## 2018-05-11 DIAGNOSIS — M25511 Pain in right shoulder: Secondary | ICD-10-CM | POA: Diagnosis not present

## 2018-05-11 DIAGNOSIS — M25512 Pain in left shoulder: Secondary | ICD-10-CM | POA: Diagnosis not present

## 2018-05-11 DIAGNOSIS — M25612 Stiffness of left shoulder, not elsewhere classified: Secondary | ICD-10-CM | POA: Diagnosis not present

## 2018-05-13 DIAGNOSIS — M25511 Pain in right shoulder: Secondary | ICD-10-CM | POA: Diagnosis not present

## 2018-05-13 DIAGNOSIS — M25611 Stiffness of right shoulder, not elsewhere classified: Secondary | ICD-10-CM | POA: Diagnosis not present

## 2018-05-13 DIAGNOSIS — M25612 Stiffness of left shoulder, not elsewhere classified: Secondary | ICD-10-CM | POA: Diagnosis not present

## 2018-05-13 DIAGNOSIS — M25512 Pain in left shoulder: Secondary | ICD-10-CM | POA: Diagnosis not present

## 2018-05-14 DIAGNOSIS — G4700 Insomnia, unspecified: Secondary | ICD-10-CM | POA: Diagnosis not present

## 2018-05-14 DIAGNOSIS — I10 Essential (primary) hypertension: Secondary | ICD-10-CM | POA: Diagnosis not present

## 2018-05-14 DIAGNOSIS — Z6841 Body Mass Index (BMI) 40.0 and over, adult: Secondary | ICD-10-CM | POA: Diagnosis not present

## 2018-05-14 DIAGNOSIS — J449 Chronic obstructive pulmonary disease, unspecified: Secondary | ICD-10-CM | POA: Diagnosis not present

## 2018-05-14 DIAGNOSIS — G2581 Restless legs syndrome: Secondary | ICD-10-CM | POA: Diagnosis not present

## 2018-05-14 DIAGNOSIS — E782 Mixed hyperlipidemia: Secondary | ICD-10-CM | POA: Diagnosis not present

## 2018-05-14 DIAGNOSIS — G6289 Other specified polyneuropathies: Secondary | ICD-10-CM | POA: Diagnosis not present

## 2018-05-15 DIAGNOSIS — M25511 Pain in right shoulder: Secondary | ICD-10-CM | POA: Diagnosis not present

## 2018-05-15 DIAGNOSIS — M25612 Stiffness of left shoulder, not elsewhere classified: Secondary | ICD-10-CM | POA: Diagnosis not present

## 2018-05-15 DIAGNOSIS — M25512 Pain in left shoulder: Secondary | ICD-10-CM | POA: Diagnosis not present

## 2018-05-15 DIAGNOSIS — M25611 Stiffness of right shoulder, not elsewhere classified: Secondary | ICD-10-CM | POA: Diagnosis not present

## 2018-05-18 DIAGNOSIS — M25512 Pain in left shoulder: Secondary | ICD-10-CM | POA: Diagnosis not present

## 2018-05-18 DIAGNOSIS — M25612 Stiffness of left shoulder, not elsewhere classified: Secondary | ICD-10-CM | POA: Diagnosis not present

## 2018-05-18 DIAGNOSIS — M25611 Stiffness of right shoulder, not elsewhere classified: Secondary | ICD-10-CM | POA: Diagnosis not present

## 2018-05-18 DIAGNOSIS — M25511 Pain in right shoulder: Secondary | ICD-10-CM | POA: Diagnosis not present

## 2018-05-19 DIAGNOSIS — M25512 Pain in left shoulder: Secondary | ICD-10-CM | POA: Diagnosis not present

## 2018-05-19 DIAGNOSIS — M25511 Pain in right shoulder: Secondary | ICD-10-CM | POA: Diagnosis not present

## 2018-05-19 DIAGNOSIS — M25611 Stiffness of right shoulder, not elsewhere classified: Secondary | ICD-10-CM | POA: Diagnosis not present

## 2018-05-19 DIAGNOSIS — M25612 Stiffness of left shoulder, not elsewhere classified: Secondary | ICD-10-CM | POA: Diagnosis not present

## 2018-05-20 DIAGNOSIS — M25612 Stiffness of left shoulder, not elsewhere classified: Secondary | ICD-10-CM | POA: Diagnosis not present

## 2018-05-20 DIAGNOSIS — M25512 Pain in left shoulder: Secondary | ICD-10-CM | POA: Diagnosis not present

## 2018-05-20 DIAGNOSIS — M25611 Stiffness of right shoulder, not elsewhere classified: Secondary | ICD-10-CM | POA: Diagnosis not present

## 2018-05-20 DIAGNOSIS — M25511 Pain in right shoulder: Secondary | ICD-10-CM | POA: Diagnosis not present

## 2018-05-25 DIAGNOSIS — M25612 Stiffness of left shoulder, not elsewhere classified: Secondary | ICD-10-CM | POA: Diagnosis not present

## 2018-05-25 DIAGNOSIS — M25512 Pain in left shoulder: Secondary | ICD-10-CM | POA: Diagnosis not present

## 2018-05-25 DIAGNOSIS — M25611 Stiffness of right shoulder, not elsewhere classified: Secondary | ICD-10-CM | POA: Diagnosis not present

## 2018-05-25 DIAGNOSIS — M25511 Pain in right shoulder: Secondary | ICD-10-CM | POA: Diagnosis not present

## 2018-05-26 DIAGNOSIS — M4310 Spondylolisthesis, site unspecified: Secondary | ICD-10-CM | POA: Diagnosis not present

## 2018-05-26 DIAGNOSIS — M47816 Spondylosis without myelopathy or radiculopathy, lumbar region: Secondary | ICD-10-CM | POA: Diagnosis not present

## 2018-05-26 DIAGNOSIS — M47817 Spondylosis without myelopathy or radiculopathy, lumbosacral region: Secondary | ICD-10-CM | POA: Diagnosis not present

## 2018-05-29 DIAGNOSIS — M25511 Pain in right shoulder: Secondary | ICD-10-CM | POA: Diagnosis not present

## 2018-05-29 DIAGNOSIS — M25611 Stiffness of right shoulder, not elsewhere classified: Secondary | ICD-10-CM | POA: Diagnosis not present

## 2018-05-29 DIAGNOSIS — M25612 Stiffness of left shoulder, not elsewhere classified: Secondary | ICD-10-CM | POA: Diagnosis not present

## 2018-05-29 DIAGNOSIS — M25512 Pain in left shoulder: Secondary | ICD-10-CM | POA: Diagnosis not present

## 2018-05-31 DIAGNOSIS — M25511 Pain in right shoulder: Secondary | ICD-10-CM | POA: Diagnosis not present

## 2018-05-31 DIAGNOSIS — M25512 Pain in left shoulder: Secondary | ICD-10-CM | POA: Diagnosis not present

## 2018-05-31 DIAGNOSIS — M25611 Stiffness of right shoulder, not elsewhere classified: Secondary | ICD-10-CM | POA: Diagnosis not present

## 2018-05-31 DIAGNOSIS — M25612 Stiffness of left shoulder, not elsewhere classified: Secondary | ICD-10-CM | POA: Diagnosis not present

## 2018-06-02 DIAGNOSIS — M25612 Stiffness of left shoulder, not elsewhere classified: Secondary | ICD-10-CM | POA: Diagnosis not present

## 2018-06-02 DIAGNOSIS — M25611 Stiffness of right shoulder, not elsewhere classified: Secondary | ICD-10-CM | POA: Diagnosis not present

## 2018-06-02 DIAGNOSIS — M25512 Pain in left shoulder: Secondary | ICD-10-CM | POA: Diagnosis not present

## 2018-06-02 DIAGNOSIS — M25511 Pain in right shoulder: Secondary | ICD-10-CM | POA: Diagnosis not present

## 2018-06-08 DIAGNOSIS — M25612 Stiffness of left shoulder, not elsewhere classified: Secondary | ICD-10-CM | POA: Diagnosis not present

## 2018-06-08 DIAGNOSIS — M25511 Pain in right shoulder: Secondary | ICD-10-CM | POA: Diagnosis not present

## 2018-06-08 DIAGNOSIS — M25611 Stiffness of right shoulder, not elsewhere classified: Secondary | ICD-10-CM | POA: Diagnosis not present

## 2018-06-08 DIAGNOSIS — M25512 Pain in left shoulder: Secondary | ICD-10-CM | POA: Diagnosis not present

## 2018-06-09 DIAGNOSIS — M25611 Stiffness of right shoulder, not elsewhere classified: Secondary | ICD-10-CM | POA: Diagnosis not present

## 2018-06-09 DIAGNOSIS — M25612 Stiffness of left shoulder, not elsewhere classified: Secondary | ICD-10-CM | POA: Diagnosis not present

## 2018-06-09 DIAGNOSIS — M25512 Pain in left shoulder: Secondary | ICD-10-CM | POA: Diagnosis not present

## 2018-06-09 DIAGNOSIS — M25511 Pain in right shoulder: Secondary | ICD-10-CM | POA: Diagnosis not present

## 2018-06-18 ENCOUNTER — Ambulatory Visit (INDEPENDENT_AMBULATORY_CARE_PROVIDER_SITE_OTHER): Payer: Medicare Other | Admitting: Vascular Surgery

## 2018-06-18 ENCOUNTER — Encounter: Payer: Self-pay | Admitting: Vascular Surgery

## 2018-06-18 ENCOUNTER — Ambulatory Visit (HOSPITAL_COMMUNITY)
Admission: RE | Admit: 2018-06-18 | Discharge: 2018-06-18 | Disposition: A | Discharge status: Home / Self Care | Payer: Medicare Other | Source: Ambulatory Visit | Attending: Family | Admitting: Family

## 2018-06-18 ENCOUNTER — Other Ambulatory Visit: Payer: Self-pay

## 2018-06-18 VITALS — BP 134/53 | HR 60 | Temp 97.5°F | Resp 16 | Ht 64.0 in | Wt 243.0 lb

## 2018-06-18 DIAGNOSIS — Z8739 Personal history of other diseases of the musculoskeletal system and connective tissue: Secondary | ICD-10-CM | POA: Diagnosis not present

## 2018-06-18 DIAGNOSIS — G6189 Other inflammatory polyneuropathies: Secondary | ICD-10-CM

## 2018-06-18 NOTE — Progress Notes (Signed)
Referring Physician: Dr Olena Heckle  Patient name: Jessica Serrano MRN: [id number] DOB: [birth year] Sex: female  REASON FOR CONSULT: Numbness tingling in feet  HPI: LEVA BAINE is a 77 y.o. female, with a 5-year history of numbness and tingling in both feet and lack of proprioception.  She has a vibration type sensation in her feet at times.  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  She was concerned because her father had gangrene in his feet when he died and wanted to make sure that there were no blood vessel issues.  She has had previous back surgery.  She states that the neuropathy got worse after that.  She states it has been slowly progressive over the last 5 years.  She does not really ambulate much so it is difficult to elicit any claudication symptoms.  However, she does not describe this.  She denies rest pain.  She denies nonhealing wounds.  She has had ingrown toenails which have healed spontaneously without difficulty.  Other medical problems include Bell's palsy arthritis both of which are currently stable.  She is currently on Neurontin 100 mg twice daily.  He occasionally has some leg swelling.  Past Medical History:  Diagnosis Date  . Bell's palsy    right  . Decrease in appetite 09/25/2015  . Depression   . Diarrhea 09/25/2015  . Dyspnea   . Hemochromatosis   . High blood pressure   . History of vaginal bleeding 09/05/2015  . LLQ pain 09/25/2015  . Neuropathy   . Osteoarthritis    knees  . Vaginal atrophy 09/05/2015  . Weight loss 09/25/2015   Past Surgical History:  Procedure Laterality Date  . ABDOMINAL HYSTERECTOMY    . BREAST SURGERY  08/2015   biopsy left breast  . CHOLECYSTECTOMY     1998  . ELBOW SURGERY    . ESOPHAGOGASTRODUODENOSCOPY N/A 03/18/2014   Procedure: ESOPHAGOGASTRODUODENOSCOPY (EGD);  Surgeon: Rogene Houston, MD;  Location: [facility];  Service: Endoscopy;  Laterality: N/A;  730   . GALLBLADDER SURGERY    . KNEE SURGERY Left   . lap chole stones    . MALONEY DILATION N/A 03/18/2014   Procedure: Venia Minks DILATION;  Surgeon: Rogene Houston, MD;  Location: [facility];  Service: Endoscopy;  Laterality: N/A;  . SHOULDER SURGERY    . TONSILLECTOMY AND ADENOIDECTOMY    . TOTAL KNEE ARTHROPLASTY     1998 left knee  . TRIGGER FINGER RELEASE      Family History  Problem Relation Age of Onset  . Stroke Unknown   . Stomach cancer Unknown   . Heart Problems Unknown   . Arthritis Unknown     SOCIAL HISTORY: Social History   Socioeconomic History  . Marital status: Widowed    Spouse name: Not on file  . Number of children: 3  . Years of education: 69  . Highest education level: Not on file  Occupational History    Comment: retired  Scientific laboratory technician  . Financial resource strain: Not on file  . Food insecurity:    Worry: Not on file    Inability: Not on file  . Transportation needs:    Medical: Not on file    Non-medical: Not on file  Tobacco Use  . Smoking status: Former Smoker    Packs/day: 3.00    Years: 42.00    Pack years: 126.00  Types: Cigarettes    Last attempt to quit: 03/19/1991    Years since quitting: 27.2  . Smokeless tobacco: Never Used  . Tobacco comment: Quit in 1993  Substance and Sexual Activity  . Alcohol use: No    Comment: Quit in 1986  . Drug use: No  . Sexual activity: Not Currently    Birth control/protection: Surgical    Comment: hyst  Lifestyle  . Physical activity:    Days per week: Not on file    Minutes per session: Not on file  . Stress: Not on file  Relationships  . Social connections:    Talks on phone: Not on file    Gets together: Not on file    Attends religious service: Not on file    Active member of club or organization: Not on file    Attends meetings of clubs or organizations: Not on file    Relationship status: Not on file  . Intimate partner violence:    Fear of current or ex partner: Not on file      Emotionally abused: Not on file    Physically abused: Not on file    Forced sexual activity: Not on file  Other Topics Concern  . Not on file  Social History Narrative   Patient is retired and lives at home alone.    High school education.   Caffeine - two cups daily.    No Known Allergies  Current Outpatient Medications  Medication Sig Dispense Refill  . atenolol (TENORMIN) 50 MG tablet Take 50 mg by mouth daily.    Marland Kitchen atorvastatin (LIPITOR) 20 MG tablet Take 20 mg by mouth daily.    . baclofen (LIORESAL) 10 MG tablet Take 10 mg by mouth 3 (three) times daily.    Marland Kitchen estradiol (ESTRACE VAGINAL) 0.1 MG/GM vaginal cream Use 1 gm in vagina at HS 72 g 0  . gabapentin (NEURONTIN) 100 MG capsule Take 100 mg by mouth 2 (two) times daily.    Marland Kitchen HYDROcodone-acetaminophen (NORCO) 7.5-325 MG per tablet Take 1 tablet by mouth every 6 (six) hours as needed for moderate pain.    Marland Kitchen losartan-hydrochlorothiazide (HYZAAR) 100-12.5 MG tablet 1 tablet daily.     . meloxicam (MOBIC) 15 MG tablet Take 15 mg by mouth daily.    Marland Kitchen rOPINIRole (REQUIP) 1 MG tablet Take 1 mg by mouth 2 (two) times daily.     . temazepam (RESTORIL) 15 MG capsule Take 15 mg by mouth. Takes 2 at bedtime    . terbinafine (LAMISIL) 250 MG tablet Take 250 mg by mouth daily.    Marland Kitchen omeprazole (PRILOSEC) 20 MG capsule TAKE 1 CAPSULE BY MOUTH ONCE DAILY 30 MINUTES BEFORE SUPPER (Patient not taking: Reported on 06/18/2018) 90 capsule 2  . oxybutynin (DITROPAN XL) 15 MG 24 hr tablet 15 mg daily.     . simvastatin (ZOCOR) 20 MG tablet Take 20 mg by mouth every evening.     No current facility-administered medications for this visit.     ROS:   General:  No weight loss, Fever, chills  HEENT: No recent headaches, no nasal bleeding, no visual changes, no sore throat  Neurologic: No dizziness, blackouts, seizures. No recent symptoms of stroke or mini- stroke. No recent episodes of slurred speech, or temporary blindness.  Cardiac: No  recent episodes of chest pain/pressure, + shortness of breath at rest.  + shortness of breath with exertion.  Denies history of atrial fibrillation or irregular heartbeat  Vascular: No  history of rest pain in feet.  No history of claudication.  No history of non-healing ulcer, No history of DVT   Pulmonary: No home oxygen, no productive cough, no hemoptysis,  No asthma or wheezing  Musculoskeletal:  [X]  Arthritis, [X]  Low back pain,  [X]  Joint pain  Hematologic:No history of hypercoagulable state.  No history of easy bleeding.  No history of anemia  Gastrointestinal: No hematochezia or melena,  No gastroesophageal reflux, no trouble swallowing  Urinary: [ ]  chronic Kidney disease, [ ]  on HD - [ ]  MWF or [ ]  TTHS, [ ]  Burning with urination, [ ]  Frequent urination, [ ]  Difficulty urinating;   Skin: No rashes  Psychological: No history of anxiety,  No history of depression   Physical Examination  Vitals:   06/18/18 1455  BP: (!) 134/53  Pulse: 60  Resp: 16  Temp: (!) 97.5 F (36.4 C)  TempSrc: Oral  SpO2: 97%  Weight: 243 lb (110.2 kg)  Height: 5\' 4"  (1.626 m)    Body mass index is 41.71 kg/m.  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  ASSESSMENT: Bilateral lower extremity numbness and tingling.  This is most likely secondary to neuropathy.  I discussed with the patient and  her daughter that there is room to titrate up the Neurontin.  Apparently she has had some difficulty with drowsiness with Lyrica in the past so they wish to proceed slowly.  I certainly agree with this as she would also be a fall risk.  No significant arterial occlusive disease.  She does have a few scattered reticular type varicosities but I do not believe that this is the etiology for her main complaint which is numbness tingling and vibration sensation in her feet which is consistent with peripheral neuropathy.   PLAN: Patient will have further discussions with her primary care physician about titration of her Neurontin.  She will try to protect her feet and check them as often as possible as well as wear socks.  She will follow-up with me on an as-needed basis.   Ruta Hinds, MD [facility] Office: [phone] Pager: [phone]  \

## 2018-07-18 DIAGNOSIS — R350 Frequency of micturition: Secondary | ICD-10-CM | POA: Diagnosis not present

## 2018-09-02 DIAGNOSIS — B351 Tinea unguium: Secondary | ICD-10-CM | POA: Diagnosis not present

## 2018-09-02 DIAGNOSIS — I10 Essential (primary) hypertension: Secondary | ICD-10-CM | POA: Diagnosis not present

## 2018-09-02 DIAGNOSIS — J449 Chronic obstructive pulmonary disease, unspecified: Secondary | ICD-10-CM | POA: Diagnosis not present

## 2018-09-02 DIAGNOSIS — G2581 Restless legs syndrome: Secondary | ICD-10-CM | POA: Diagnosis not present

## 2018-09-02 DIAGNOSIS — E782 Mixed hyperlipidemia: Secondary | ICD-10-CM | POA: Diagnosis not present

## 2018-09-10 DIAGNOSIS — G2581 Restless legs syndrome: Secondary | ICD-10-CM | POA: Diagnosis not present

## 2018-09-10 DIAGNOSIS — G6289 Other specified polyneuropathies: Secondary | ICD-10-CM | POA: Diagnosis not present

## 2018-09-10 DIAGNOSIS — M19012 Primary osteoarthritis, left shoulder: Secondary | ICD-10-CM | POA: Diagnosis not present

## 2018-09-10 DIAGNOSIS — G4700 Insomnia, unspecified: Secondary | ICD-10-CM | POA: Diagnosis not present

## 2018-09-10 DIAGNOSIS — Z6841 Body Mass Index (BMI) 40.0 and over, adult: Secondary | ICD-10-CM | POA: Diagnosis not present

## 2018-09-10 DIAGNOSIS — M19011 Primary osteoarthritis, right shoulder: Secondary | ICD-10-CM | POA: Diagnosis not present

## 2018-09-10 DIAGNOSIS — I10 Essential (primary) hypertension: Secondary | ICD-10-CM | POA: Diagnosis not present

## 2018-09-10 DIAGNOSIS — B351 Tinea unguium: Secondary | ICD-10-CM | POA: Diagnosis not present

## 2018-09-10 DIAGNOSIS — M545 Low back pain: Secondary | ICD-10-CM | POA: Diagnosis not present

## 2018-09-10 DIAGNOSIS — E782 Mixed hyperlipidemia: Secondary | ICD-10-CM | POA: Diagnosis not present

## 2018-09-10 DIAGNOSIS — Z9189 Other specified personal risk factors, not elsewhere classified: Secondary | ICD-10-CM | POA: Diagnosis not present

## 2018-09-10 DIAGNOSIS — M1711 Unilateral primary osteoarthritis, right knee: Secondary | ICD-10-CM | POA: Diagnosis not present

## 2018-09-10 DIAGNOSIS — J449 Chronic obstructive pulmonary disease, unspecified: Secondary | ICD-10-CM | POA: Diagnosis not present

## 2018-09-21 DIAGNOSIS — M47817 Spondylosis without myelopathy or radiculopathy, lumbosacral region: Secondary | ICD-10-CM | POA: Diagnosis not present

## 2018-09-21 DIAGNOSIS — M4310 Spondylolisthesis, site unspecified: Secondary | ICD-10-CM | POA: Diagnosis not present

## 2018-09-21 DIAGNOSIS — M47816 Spondylosis without myelopathy or radiculopathy, lumbar region: Secondary | ICD-10-CM | POA: Diagnosis not present

## 2018-12-17 DIAGNOSIS — M12811 Other specific arthropathies, not elsewhere classified, right shoulder: Secondary | ICD-10-CM | POA: Diagnosis not present

## 2018-12-17 DIAGNOSIS — S46811A Strain of other muscles, fascia and tendons at shoulder and upper arm level, right arm, initial encounter: Secondary | ICD-10-CM | POA: Diagnosis not present

## 2018-12-17 DIAGNOSIS — M25511 Pain in right shoulder: Secondary | ICD-10-CM | POA: Diagnosis not present

## 2018-12-17 DIAGNOSIS — M7551 Bursitis of right shoulder: Secondary | ICD-10-CM | POA: Diagnosis not present

## 2018-12-29 DIAGNOSIS — Z6841 Body Mass Index (BMI) 40.0 and over, adult: Secondary | ICD-10-CM | POA: Diagnosis not present

## 2018-12-29 DIAGNOSIS — S80861A Insect bite (nonvenomous), right lower leg, initial encounter: Secondary | ICD-10-CM | POA: Diagnosis not present

## 2018-12-30 DIAGNOSIS — M19011 Primary osteoarthritis, right shoulder: Secondary | ICD-10-CM | POA: Diagnosis not present

## 2019-01-28 DIAGNOSIS — I10 Essential (primary) hypertension: Secondary | ICD-10-CM | POA: Diagnosis not present

## 2019-01-28 DIAGNOSIS — E782 Mixed hyperlipidemia: Secondary | ICD-10-CM | POA: Diagnosis not present

## 2019-02-04 DIAGNOSIS — B351 Tinea unguium: Secondary | ICD-10-CM | POA: Diagnosis not present

## 2019-02-04 DIAGNOSIS — G2581 Restless legs syndrome: Secondary | ICD-10-CM | POA: Diagnosis not present

## 2019-02-04 DIAGNOSIS — M19012 Primary osteoarthritis, left shoulder: Secondary | ICD-10-CM | POA: Diagnosis not present

## 2019-02-04 DIAGNOSIS — Z0001 Encounter for general adult medical examination with abnormal findings: Secondary | ICD-10-CM | POA: Diagnosis not present

## 2019-02-04 DIAGNOSIS — G4700 Insomnia, unspecified: Secondary | ICD-10-CM | POA: Diagnosis not present

## 2019-02-04 DIAGNOSIS — J449 Chronic obstructive pulmonary disease, unspecified: Secondary | ICD-10-CM | POA: Diagnosis not present

## 2019-02-04 DIAGNOSIS — I10 Essential (primary) hypertension: Secondary | ICD-10-CM | POA: Diagnosis not present

## 2019-02-04 DIAGNOSIS — R300 Dysuria: Secondary | ICD-10-CM | POA: Diagnosis not present

## 2019-02-04 DIAGNOSIS — G6289 Other specified polyneuropathies: Secondary | ICD-10-CM | POA: Diagnosis not present

## 2019-02-04 DIAGNOSIS — M545 Low back pain: Secondary | ICD-10-CM | POA: Diagnosis not present

## 2019-02-04 DIAGNOSIS — E782 Mixed hyperlipidemia: Secondary | ICD-10-CM | POA: Diagnosis not present

## 2019-02-04 DIAGNOSIS — R4582 Worries: Secondary | ICD-10-CM | POA: Diagnosis not present

## 2019-02-04 DIAGNOSIS — M19011 Primary osteoarthritis, right shoulder: Secondary | ICD-10-CM | POA: Diagnosis not present

## 2019-02-04 DIAGNOSIS — Z9189 Other specified personal risk factors, not elsewhere classified: Secondary | ICD-10-CM | POA: Diagnosis not present

## 2019-02-04 DIAGNOSIS — Z6841 Body Mass Index (BMI) 40.0 and over, adult: Secondary | ICD-10-CM | POA: Diagnosis not present

## 2019-02-09 DIAGNOSIS — Z91038 Other insect allergy status: Secondary | ICD-10-CM | POA: Diagnosis not present

## 2019-02-09 DIAGNOSIS — S80862D Insect bite (nonvenomous), left lower leg, subsequent encounter: Secondary | ICD-10-CM | POA: Diagnosis not present

## 2019-02-09 DIAGNOSIS — W57XXXA Bitten or stung by nonvenomous insect and other nonvenomous arthropods, initial encounter: Secondary | ICD-10-CM | POA: Diagnosis not present

## 2019-02-17 DIAGNOSIS — M19011 Primary osteoarthritis, right shoulder: Secondary | ICD-10-CM | POA: Diagnosis not present

## 2019-02-17 DIAGNOSIS — Z6841 Body Mass Index (BMI) 40.0 and over, adult: Secondary | ICD-10-CM | POA: Diagnosis not present

## 2019-02-23 DIAGNOSIS — Z1231 Encounter for screening mammogram for malignant neoplasm of breast: Secondary | ICD-10-CM | POA: Diagnosis not present

## 2019-04-08 DIAGNOSIS — M810 Age-related osteoporosis without current pathological fracture: Secondary | ICD-10-CM | POA: Diagnosis not present

## 2019-04-13 DIAGNOSIS — R609 Edema, unspecified: Secondary | ICD-10-CM | POA: Diagnosis not present

## 2019-04-13 DIAGNOSIS — Z6841 Body Mass Index (BMI) 40.0 and over, adult: Secondary | ICD-10-CM | POA: Diagnosis not present

## 2019-04-19 DIAGNOSIS — Z23 Encounter for immunization: Secondary | ICD-10-CM | POA: Diagnosis not present

## 2019-04-21 DIAGNOSIS — R600 Localized edema: Secondary | ICD-10-CM | POA: Diagnosis not present

## 2019-04-21 DIAGNOSIS — I059 Rheumatic mitral valve disease, unspecified: Secondary | ICD-10-CM | POA: Diagnosis not present

## 2019-04-21 DIAGNOSIS — I700 Atherosclerosis of aorta: Secondary | ICD-10-CM | POA: Diagnosis not present

## 2019-04-29 DIAGNOSIS — Z961 Presence of intraocular lens: Secondary | ICD-10-CM | POA: Diagnosis not present

## 2019-05-06 DIAGNOSIS — R4582 Worries: Secondary | ICD-10-CM | POA: Diagnosis not present

## 2019-05-06 DIAGNOSIS — M19011 Primary osteoarthritis, right shoulder: Secondary | ICD-10-CM | POA: Diagnosis not present

## 2019-05-06 DIAGNOSIS — J449 Chronic obstructive pulmonary disease, unspecified: Secondary | ICD-10-CM | POA: Diagnosis not present

## 2019-05-06 DIAGNOSIS — B351 Tinea unguium: Secondary | ICD-10-CM | POA: Diagnosis not present

## 2019-05-06 DIAGNOSIS — I10 Essential (primary) hypertension: Secondary | ICD-10-CM | POA: Diagnosis not present

## 2019-05-06 DIAGNOSIS — E782 Mixed hyperlipidemia: Secondary | ICD-10-CM | POA: Diagnosis not present

## 2019-05-06 DIAGNOSIS — M19012 Primary osteoarthritis, left shoulder: Secondary | ICD-10-CM | POA: Diagnosis not present

## 2019-05-06 DIAGNOSIS — Z6841 Body Mass Index (BMI) 40.0 and over, adult: Secondary | ICD-10-CM | POA: Diagnosis not present

## 2019-05-19 DIAGNOSIS — M1711 Unilateral primary osteoarthritis, right knee: Secondary | ICD-10-CM | POA: Diagnosis not present

## 2019-05-19 DIAGNOSIS — M545 Low back pain: Secondary | ICD-10-CM | POA: Diagnosis not present

## 2019-05-19 DIAGNOSIS — M25562 Pain in left knee: Secondary | ICD-10-CM | POA: Diagnosis not present

## 2019-05-19 DIAGNOSIS — Z6841 Body Mass Index (BMI) 40.0 and over, adult: Secondary | ICD-10-CM | POA: Diagnosis not present

## 2019-05-19 DIAGNOSIS — M1612 Unilateral primary osteoarthritis, left hip: Secondary | ICD-10-CM | POA: Diagnosis not present

## 2019-06-11 DIAGNOSIS — M1612 Unilateral primary osteoarthritis, left hip: Secondary | ICD-10-CM | POA: Diagnosis not present

## 2019-06-14 ENCOUNTER — Other Ambulatory Visit: Payer: Self-pay | Admitting: Orthopedic Surgery

## 2019-06-14 DIAGNOSIS — M1612 Unilateral primary osteoarthritis, left hip: Secondary | ICD-10-CM

## 2019-08-12 DIAGNOSIS — J449 Chronic obstructive pulmonary disease, unspecified: Secondary | ICD-10-CM | POA: Diagnosis not present

## 2019-08-12 DIAGNOSIS — M545 Low back pain: Secondary | ICD-10-CM | POA: Diagnosis not present

## 2019-08-12 DIAGNOSIS — I10 Essential (primary) hypertension: Secondary | ICD-10-CM | POA: Diagnosis not present

## 2019-08-12 DIAGNOSIS — E782 Mixed hyperlipidemia: Secondary | ICD-10-CM | POA: Diagnosis not present

## 2019-08-12 DIAGNOSIS — B351 Tinea unguium: Secondary | ICD-10-CM | POA: Diagnosis not present

## 2019-08-12 DIAGNOSIS — G2581 Restless legs syndrome: Secondary | ICD-10-CM | POA: Diagnosis not present

## 2019-08-12 DIAGNOSIS — R4582 Worries: Secondary | ICD-10-CM | POA: Diagnosis not present

## 2019-08-12 DIAGNOSIS — Z6841 Body Mass Index (BMI) 40.0 and over, adult: Secondary | ICD-10-CM | POA: Diagnosis not present

## 2019-11-11 DIAGNOSIS — M19012 Primary osteoarthritis, left shoulder: Secondary | ICD-10-CM | POA: Diagnosis not present

## 2019-11-11 DIAGNOSIS — E782 Mixed hyperlipidemia: Secondary | ICD-10-CM | POA: Diagnosis not present

## 2019-11-11 DIAGNOSIS — M19011 Primary osteoarthritis, right shoulder: Secondary | ICD-10-CM | POA: Diagnosis not present

## 2019-11-11 DIAGNOSIS — Z6839 Body mass index (BMI) 39.0-39.9, adult: Secondary | ICD-10-CM | POA: Diagnosis not present

## 2019-11-11 DIAGNOSIS — I10 Essential (primary) hypertension: Secondary | ICD-10-CM | POA: Diagnosis not present

## 2019-11-11 DIAGNOSIS — B351 Tinea unguium: Secondary | ICD-10-CM | POA: Diagnosis not present

## 2019-11-11 DIAGNOSIS — J449 Chronic obstructive pulmonary disease, unspecified: Secondary | ICD-10-CM | POA: Diagnosis not present

## 2019-11-11 DIAGNOSIS — R4582 Worries: Secondary | ICD-10-CM | POA: Diagnosis not present

## 2019-11-25 DIAGNOSIS — M1612 Unilateral primary osteoarthritis, left hip: Secondary | ICD-10-CM | POA: Diagnosis not present

## 2019-11-30 ENCOUNTER — Encounter (HOSPITAL_COMMUNITY)
Admission: RE | Admit: 2019-11-30 | Discharge: 2019-11-30 | Disposition: A | Discharge status: Home / Self Care | Payer: Medicare Other | Source: Ambulatory Visit | Attending: Orthopedic Surgery | Admitting: Orthopedic Surgery

## 2019-11-30 ENCOUNTER — Encounter (HOSPITAL_COMMUNITY): Payer: Self-pay

## 2019-11-30 ENCOUNTER — Other Ambulatory Visit: Payer: Self-pay

## 2019-11-30 DIAGNOSIS — Z01812 Encounter for preprocedural laboratory examination: Secondary | ICD-10-CM | POA: Insufficient documentation

## 2019-11-30 HISTORY — DX: Chronic obstructive pulmonary disease, unspecified: J44.9

## 2019-11-30 NOTE — Progress Notes (Signed)
PCP - Dr. Keturah Barre. [place] Cardiologist - no  Chest x-ray - no EKG - 5/27 Stress Test - no ECHO - 04/22/19 Cardiac Cath - no  Sleep Study - NA CPAP -   Fasting Blood Sugar - NA Checks Blood Sugar _____ times a day  Blood Thinner Instructions:NA Aspirin Instructions: Last Dose:  Anesthesia review:   Patient denies shortness of breath, fever, cough and chest pain at PAT appointment yes  Patient verbalized understanding of instructions that were given to them at the PAT appointment. Patient was also instructed that they will need to review over the PAT instructions again at home before surgery. Yes  Pt has a difficult time walking and uses a wheelchair. Her Daughter is a Marine scientist and has been caring for her prior to surgery. The Pt is not able to get in the shower at her daughter's house. She will go to re hab after surgery

## 2019-11-30 NOTE — Patient Instructions (Addendum)
DUE TO COVID-19 ONLY ONE VISITOR IS ALLOWED TO COME WITH YOU AND STAY IN THE WAITING ROOM ONLY DURING PRE OP AND PROCEDURE DAY OF SURGERY. THE 2 VISITORS MAY VISIT WITH YOU AFTER SURGERY IN YOUR PRIVATE ROOM DURING VISITING HOURS ONLY!  YOU NEED TO HAVE A COVID 19 TEST ON_5/28______ @__2 :00 PM____, THIS TEST MUST BE DONE BEFORE SURGERY,  COME  [address], [place] , [address].  ([place])  [place] TEST IS COMPLETED, PLEASE BEGIN THE QUARANTINE INSTRUCTIONS AS OUTLINED IN YOUR HANDOUT.( You may go to Dr Appointments)                [place]    Your procedure is scheduled on: 12/08/19   Report to [facility] Main  Entrance   Report to admitting at  1:35 PM     Call this number if you have problems the morning of surgery [place], [place].    Do not eat food After Midnight.   YOU MAY HAVE CLEAR LIQUIDS FROM MIDNIGHT UNTIL 1:00 PM.   CLEAR LIQUID DIET   Foods Allowed                                                                     Foods Excluded  Coffee and tea, regular and decaf                             liquids that you cannot  Plain Jell-O any favor except red or purple                                           see through such as: Fruit ices (not with fruit pulp)                                     milk, soups, orange juice  Iced Popsicles                                    All solid food Carbonated beverages, regular and diet                                    Cranberry, grape and apple juices Sports drinks like Gatorade Lightly seasoned clear broth or consume Sugar, honey syrup    At 1:00 PM Please finish the prescribed Pre-Surgery  Drink.   Nothing by mouth after you finish the  drink !   Take these medicines the morning of surgery with A SIP OF WATER: Atenolol, Venlafaxine, Ropinrole                                  You may not  have any metal on your body including hair pins and              piercings  Do not wear jewelry, make-up, lotions, powders or perfumes, deodorant             Do not wear nail polish on your fingernails.  Do not shave  48 hours prior to surgery.              Do not bring valuables to the hospital. [place].  Contacts, dentures or bridgework may not be worn into surgery.        Special Instructions: N/A              Please read over the following fact sheets you were given:   Let the Nursing staff know that you were not able to take the CHG shower the night before and day of surgery . They will wipe you down with CHG wipes the morning of surgery in the pre op Short Stay Dept.                FAILURE TO FOLLOW THESE INSTRUCTIONS MAY RESULT IN THE CANCELLATION OF YOUR SURGERY PATIENT SIGNATURE_________________________________  NURSE SIGNATURE__________________________________  ________________________________________________________________________   Jessica Serrano  An incentive spirometer is a tool that can help keep your lungs clear and active. This tool measures how well you are filling your lungs with each breath. Taking long deep breaths may help reverse or decrease the chance of developing breathing (pulmonary) problems (especially infection) following:  A long period of time when you are unable to move or be active. BEFORE THE PROCEDURE   If the spirometer includes an indicator to show your best effort, your nurse or respiratory therapist will set it to a desired goal.  If possible, sit up straight or lean slightly forward. Try not to slouch.  Hold the incentive spirometer in an upright position. INSTRUCTIONS FOR USE  1. Sit on the edge of your bed if possible, or sit up as far as you can in bed or on a chair. 2. Hold the incentive spirometer in an upright position. 3. Breathe out normally. 4. Place the  mouthpiece in your mouth and seal your lips tightly around it. 5. Breathe in slowly and as deeply as possible, raising the piston or the ball toward the top of the column. 6. Hold your breath for 3-5 seconds or for as long as possible. Allow the piston or ball to fall to the bottom of the column. 7. Remove the mouthpiece from your mouth and breathe out normally. 8. Rest for a few seconds and repeat Steps 1 through 7 at least 10 times every 1-2 hours when you are awake. Take your time and take a few normal breaths between deep breaths. 9. The spirometer may include an indicator to show your best effort. Use the indicator as a goal to work toward during each repetition. 10. After each set of 10 deep breaths, practice coughing to be sure your lungs are clear. If you have an incision (the cut made at the time of surgery), support your incision when coughing by placing a pillow or rolled up towels firmly against it. Once you are able to get out of bed, walk around indoors and cough well. You may stop using the incentive spirometer when instructed by your caregiver.  RISKS AND COMPLICATIONS  Take your  time so you do not get dizzy or light-headed.  If you are in pain, you may need to take or ask for pain medication before doing incentive spirometry. It is harder to take a deep breath if you are having pain. AFTER USE  Rest and breathe slowly and easily.  It can be helpful to keep track of a log of your progress. Your caregiver can provide you with a simple table to help with this. If you are using the spirometer at home, follow these instructions: [place] IF:   You are having difficultly using the spirometer.  You have trouble using the spirometer as often as instructed.  Your pain medication is not giving enough relief while using the spirometer.  You develop fever of 100.5 F (38.1 C) or higher. SEEK IMMEDIATE MEDICAL CARE IF:   You cough up bloody sputum that had not been present  before.  You develop fever of 102 F (38.9 C) or greater.  You develop worsening pain at or near the incision site. MAKE SURE YOU:   Understand these instructions.  Will watch your condition.  Will get help right away if you are not doing well or get worse. Document Released: 11/04/2006 Document Revised: 09/16/2011 Document Reviewed: 01/05/2007 [facility] Patient Information 2014 [place], [place].   ________________________________________________________________________

## 2019-12-02 ENCOUNTER — Encounter (HOSPITAL_COMMUNITY)
Admission: RE | Admit: 2019-12-02 | Discharge: 2019-12-02 | Disposition: A | Discharge status: Home / Self Care | Payer: Medicare Other | Source: Ambulatory Visit | Attending: Orthopedic Surgery | Admitting: Orthopedic Surgery

## 2019-12-02 DIAGNOSIS — Z01812 Encounter for preprocedural laboratory examination: Secondary | ICD-10-CM | POA: Diagnosis not present

## 2019-12-02 LAB — PROTIME-INR
INR: 1 (ref 0.8–1.2)
Prothrombin Time: 12.7 seconds (ref 11.4–15.2)

## 2019-12-02 LAB — CBC
HCT: 47 % — ABNORMAL HIGH (ref 36.0–46.0)
Hemoglobin: 15.4 g/dL — ABNORMAL HIGH (ref 12.0–15.0)
MCH: 33.4 pg (ref 26.0–34.0)
MCHC: 32.8 g/dL (ref 30.0–36.0)
MCV: 102 fL — ABNORMAL HIGH (ref 80.0–100.0)
Platelets: 159 10*3/uL (ref 150–400)
RBC: 4.61 MIL/uL (ref 3.87–5.11)
RDW: 12.2 % (ref 11.5–15.5)
WBC: 7.9 10*3/uL (ref 4.0–10.5)
nRBC: 0 % (ref 0.0–0.2)

## 2019-12-02 LAB — COMPREHENSIVE METABOLIC PANEL
ALT: 71 U/L — ABNORMAL HIGH (ref 0–44)
AST: 43 U/L — ABNORMAL HIGH (ref 15–41)
Albumin: 4.4 g/dL (ref 3.5–5.0)
Alkaline Phosphatase: 102 U/L (ref 38–126)
Anion gap: 10 (ref 5–15)
BUN: 29 mg/dL — ABNORMAL HIGH (ref 8–23)
CO2: 28 mmol/L (ref 22–32)
Calcium: 9.9 mg/dL (ref 8.9–10.3)
Chloride: 104 mmol/L (ref 98–111)
Creatinine, Ser: 0.9 mg/dL (ref 0.44–1.00)
GFR calc Af Amer: >60 mL/min (ref >60)
GFR calc non Af Amer: >60 mL/min (ref >60)
Glucose, Bld: 102 mg/dL — ABNORMAL HIGH (ref 70–99)
Potassium: 4 mmol/L (ref 3.5–5.1)
Sodium: 142 mmol/L (ref 135–145)
Total Bilirubin: 0.3 mg/dL (ref 0.3–1.2)
Total Protein: 7.1 g/dL (ref 6.5–8.1)

## 2019-12-02 LAB — SURGICAL PCR SCREEN
MRSA, PCR: NEGATIVE
Staphylococcus aureus: NEGATIVE

## 2019-12-02 LAB — APTT: aPTT: 31 seconds (ref 24–36)

## 2019-12-02 NOTE — Progress Notes (Signed)
LVM for pt to come on Sat 5/29 for covid testing for her procedure on Wed 6/2, as she was scheduled too early. Pt should come to the  [facility] testing site between 9:00am-12:45pm. We will not test if she arrives on Fri 5/28. The only exception is approval from the anesthesia department. For any questions please call [phone].

## 2019-12-03 ENCOUNTER — Other Ambulatory Visit (HOSPITAL_COMMUNITY): Payer: Medicare Other

## 2019-12-03 ENCOUNTER — Inpatient Hospital Stay (HOSPITAL_COMMUNITY)
Admission: RE | Admit: 2019-12-03 | Discharge: 2019-12-03 | Disposition: A | Discharge status: Home / Self Care | Payer: Medicare Other | Source: Ambulatory Visit

## 2019-12-03 LAB — ABO/RH: ABO/RH(D): O POS

## 2019-12-03 NOTE — Progress Notes (Signed)
Pt and daughter Nevin Bloodgood came for covid testing today, 5/28 at 2p, and sts that because they were having transportation issues and could not come on the scheduled day of Sat 5/29 at 12:10p, per Milus Banister, [place]. Per Milus Banister, RN, the pt and daughter was told to arrive on Sat 5/29 at 12:10p. Milus Banister, RN called the PA for anesthesia at [facility] to inquire if the testing was okay for today, and the response was she was too early, so a Rapid covid test will be performed on Wed 6/2, the day of surgery.

## 2019-12-03 NOTE — Progress Notes (Signed)
I called Glendale Chard  about the Pt and her Daughter having issues with the Covid test. She said that we will be able to do a rapid test  DOS. She will call Leafy Half the Director of Short Stay and  follow up with the Daughter.

## 2019-12-04 ENCOUNTER — Other Ambulatory Visit (HOSPITAL_COMMUNITY)
Admission: RE | Admit: 2019-12-04 | Discharge: 2019-12-04 | Disposition: A | Discharge status: Home / Self Care | Payer: Medicare Other | Source: Ambulatory Visit | Attending: Orthopedic Surgery | Admitting: Orthopedic Surgery

## 2019-12-07 NOTE — H&P (Signed)
TOTAL HIP ADMISSION H&P  Patient is admitted for left total hip arthroplasty.  Subjective:  Chief Complaint: left hip pain  HPI: Jessica Serrano, 79 y.o. female, has a history of pain and functional disability in the left hip(s) due to arthritis and patient has failed non-surgical conservative treatments for greater than 12 weeks to include activity modification.  Onset of symptoms was gradual starting 2 years ago with gradually worsening course since that time.The patient noted no past surgery on the left hip(s).  Patient currently rates pain in the left hip at 7 out of 10 with activity. Patient has worsening of pain with activity and weight bearing and pain that interfers with activities of daily living. Patient has evidence of joint space narrowing by imaging studies. This condition presents safety issues increasing the risk of falls.  There is no current active infection.  Patient Active Problem List   Diagnosis Date Noted  . Diarrhea 09/25/2015  . Weight loss 09/25/2015  . Decrease in appetite 09/25/2015  . LLQ pain 09/25/2015  . Vaginal discharge 09/05/2015  . Vaginal atrophy 09/05/2015  . History of vaginal bleeding 09/05/2015  . Dysphagia, unspecified(787.20) 12/06/2013  . GERD (gastroesophageal reflux disease) 12/06/2013  . Unspecified hereditary and idiopathic peripheral neuropathy 04/16/2013  . CHEST PAIN 11/27/2007  . HYPERCHOLESTEROLEMIA 11/06/2007  . OBESITY, MORBID 11/06/2007  . HYPERTENSION 11/06/2007  . DYSPNEA 11/06/2007  . ANGINA, HX OF 11/06/2007   Past Medical History:  Diagnosis Date  . Bell's palsy    right  . COPD (chronic obstructive pulmonary disease) (HCC)    mild  . Decrease in appetite 09/25/2015  . Depression   . Diarrhea 09/25/2015  . Dyspnea   . Hemochromatosis   . High blood pressure   . History of vaginal bleeding 09/05/2015  . LLQ pain 09/25/2015  . Neuropathy   . Osteoarthritis    knees  . Vaginal atrophy 09/05/2015  . Weight loss  09/25/2015    Past Surgical History:  Procedure Laterality Date  . ABDOMINAL HYSTERECTOMY    . APPENDECTOMY    . BACK SURGERY  2018   screws and cage in lower back  . BREAST SURGERY  08/2015   biopsy left breast  . CHOLECYSTECTOMY     1998  . ELBOW SURGERY    . ESOPHAGOGASTRODUODENOSCOPY N/A 03/18/2014   Procedure: ESOPHAGOGASTRODUODENOSCOPY (EGD);  Surgeon: Rogene Houston, MD;  Location: [facility];  Service: Endoscopy;  Laterality: N/A;  730  . EYE SURGERY Bilateral 2014,2012  . GALLBLADDER SURGERY    . JOINT REPLACEMENT Left   . KNEE SURGERY Left   . lap chole stones    . MALONEY DILATION N/A 03/18/2014   Procedure: Venia Minks DILATION;  Surgeon: Rogene Houston, MD;  Location: [facility];  Service: Endoscopy;  Laterality: N/A;  . SHOULDER SURGERY    . TONSILLECTOMY AND ADENOIDECTOMY    . TOTAL KNEE ARTHROPLASTY     1998 left knee  . TRIGGER FINGER RELEASE      No current facility-administered medications for this encounter.   Current Outpatient Medications  Medication Sig Dispense Refill Last Dose  . atenolol (TENORMIN) 50 MG tablet Take 50 mg by mouth daily.     Marland Kitchen atorvastatin (LIPITOR) 20 MG tablet Take 20 mg by mouth daily.     . baclofen (LIORESAL) 10 MG tablet Take 10 mg by mouth 3 (three) times daily.     Marland Kitchen docusate sodium (COLACE) 100 MG capsule Take 100 mg by mouth  2 (two) times daily as needed for mild constipation.     . furosemide (LASIX) 40 MG tablet Take 40 mg by mouth 2 (two) times daily.     Marland Kitchen gabapentin (NEURONTIN) 600 MG tablet Take 600-1,200 mg by mouth See admin instructions. 600 mg in the  Morning, and 1200 mg at bedtime     . losartan (COZAAR) 25 MG tablet Take 25 mg by mouth daily.     . meloxicam (MOBIC) 15 MG tablet Take 15 mg by mouth daily.     Marland Kitchen oxyCODONE-acetaminophen (PERCOCET) 10-325 MG tablet Take 1 tablet by mouth in the morning, at noon, and at bedtime.     Marland Kitchen rOPINIRole (REQUIP) 1 MG tablet Take 1 mg by mouth 2 (two) times daily.      .  temazepam (RESTORIL) 15 MG capsule Take 30 mg by mouth at bedtime.      Marland Kitchen venlafaxine (EFFEXOR) 75 MG tablet Take 75 mg by mouth 2 (two) times daily.      No Known Allergies  Social History   Tobacco Use  . Smoking status: Former Smoker    Packs/day: 3.00    Years: 42.00    Pack years: 126.00    Types: Cigarettes    Quit date: 03/19/1991    Years since quitting: 28.7  . Smokeless tobacco: Never Used  . Tobacco comment: Quit in 1993  Substance Use Topics  . Alcohol use: No    Comment: Quit in 76    Family History  Problem Relation Age of Onset  . Stroke Other   . Stomach cancer Other   . Heart Problems Other   . Arthritis Other      Review of Systems  Constitutional: Negative for chills and fever.  Respiratory: Negative for cough and shortness of breath.   Gastrointestinal: Negative for nausea and vomiting.  Musculoskeletal: Positive for arthralgias.    Objective:  Physical Exam  Patient is a 79 year old female.  Well nourished and well developed. General: Alert and oriented x3, cooperative and pleasant, no acute distress. Head: normocephalic, atraumatic, neck supple. Eyes: EOMI. Respiratory: breath sounds clear in all fields, no wheezing, rales, or rhonchi. Cardiovascular: Regular rate and rhythm, no murmurs, gallops or rubs. Abdomen: non-tender to palpation and soft, normoactive bowel sounds.  Musculoskeletal: Left Hip Exam: The range of motion of the hip in any direction causes an exacerbation of pain. Internal Rotation to 5 degrees, External Rotation to 5 degrees, and abduction to 10 degrees without discomfort.  Calves soft and nontender. Motor function intact in LE. Strength 5/5 LE bilaterally. Neuro: Distal pulses 2+. Sensation to light touch intact in LE.  Vital signs in last 24 hours:    Labs:  Estimated body mass index is 41.07 kg/m as calculated from the following:   Height as of 12/02/19: 5' 4.5" (1.638 m).   Weight as of 06/18/18: 110.2  kg.   Imaging Review Plain radiographs demonstrate severe degenerative joint disease of the left hip(s). The bone quality appears to be adequate for age and reported activity level.  Assessment/Plan:  End stage arthritis, left hip(s)  The patient history, physical examination, clinical judgement of the provider and imaging studies are consistent with end stage degenerative joint disease of the left hip(s) and total hip arthroplasty is deemed medically necessary. The treatment options including medical management, injection therapy, arthroscopy and arthroplasty were discussed at length. The risks and benefits of total hip arthroplasty were presented and reviewed. The risks due to aseptic loosening, infection,  stiffness, dislocation/subluxation,  thromboembolic complications and other imponderables were discussed.  The patient acknowledged the explanation, agreed to proceed with the plan and consent was signed. Patient is being admitted for inpatient treatment for surgery, pain control, PT, OT, prophylactic antibiotics, VTE prophylaxis, progressive ambulation and ADL's and discharge planning.The patient is planning to be discharged home.   Therapy Plans: SNF at [facility] rehab in [place] Disposition: SNF Planned DVT Prophylaxis: aspirin 325mg  BID DME needed: none PCP: Dr. Gar Ponto, clearance received TXA: IV Allergies: NKDA Anesthesia Concerns: prefers general BMI: 39 Not diabetic.  Other: Daughter works at [employer], likely not in [place]. Does not have family at home. Hx of Bell's palsy, with residual right sided facial drooping.  Takes Oxycodone 20 mg QID.  - Patient was instructed on what medications to stop prior to surgery. - Follow-up visit in 2 weeks with Dr. Wynelle Link - Begin physical therapy following surgery - Pre-operative lab work as pre-surgical testing - Prescriptions will be provided in hospital at time of discharge  Griffith Citron, PA-C Orthopedic Surgery [facility] [place] [phone]

## 2019-12-08 ENCOUNTER — Inpatient Hospital Stay (HOSPITAL_COMMUNITY): Payer: Medicare Other

## 2019-12-08 ENCOUNTER — Encounter (HOSPITAL_COMMUNITY)
Admission: RE | Disposition: A | Discharge status: Skilled Nursing Facility | Payer: Self-pay | Source: Home / Self Care | Attending: Orthopedic Surgery

## 2019-12-08 ENCOUNTER — Other Ambulatory Visit: Payer: Self-pay

## 2019-12-08 ENCOUNTER — Telehealth (HOSPITAL_COMMUNITY): Payer: Self-pay | Admitting: *Deleted

## 2019-12-08 ENCOUNTER — Inpatient Hospital Stay (HOSPITAL_COMMUNITY)
Admission: RE | Admit: 2019-12-08 | Discharge: 2019-12-11 | DRG: 470 | Disposition: A | Discharge status: Skilled Nursing Facility | Payer: Medicare Other | Attending: Orthopedic Surgery | Admitting: Orthopedic Surgery

## 2019-12-08 ENCOUNTER — Inpatient Hospital Stay (HOSPITAL_COMMUNITY): Payer: Medicare Other | Admitting: Anesthesiology

## 2019-12-08 ENCOUNTER — Encounter (HOSPITAL_COMMUNITY): Payer: Self-pay | Admitting: Orthopedic Surgery

## 2019-12-08 DIAGNOSIS — Z791 Long term (current) use of non-steroidal anti-inflammatories (NSAID): Secondary | ICD-10-CM

## 2019-12-08 DIAGNOSIS — G629 Polyneuropathy, unspecified: Secondary | ICD-10-CM | POA: Diagnosis not present

## 2019-12-08 DIAGNOSIS — F339 Major depressive disorder, recurrent, unspecified: Secondary | ICD-10-CM | POA: Diagnosis not present

## 2019-12-08 DIAGNOSIS — R2689 Other abnormalities of gait and mobility: Secondary | ICD-10-CM | POA: Diagnosis not present

## 2019-12-08 DIAGNOSIS — M169 Osteoarthritis of hip, unspecified: Secondary | ICD-10-CM | POA: Diagnosis present

## 2019-12-08 DIAGNOSIS — K219 Gastro-esophageal reflux disease without esophagitis: Secondary | ICD-10-CM | POA: Diagnosis present

## 2019-12-08 DIAGNOSIS — I10 Essential (primary) hypertension: Secondary | ICD-10-CM | POA: Diagnosis present

## 2019-12-08 DIAGNOSIS — S82122A Displaced fracture of lateral condyle of left tibia, initial encounter for closed fracture: Secondary | ICD-10-CM

## 2019-12-08 DIAGNOSIS — J449 Chronic obstructive pulmonary disease, unspecified: Secondary | ICD-10-CM | POA: Diagnosis present

## 2019-12-08 DIAGNOSIS — M1612 Unilateral primary osteoarthritis, left hip: Principal | ICD-10-CM | POA: Diagnosis present

## 2019-12-08 DIAGNOSIS — E7800 Pure hypercholesterolemia, unspecified: Secondary | ICD-10-CM | POA: Diagnosis present

## 2019-12-08 DIAGNOSIS — M6281 Muscle weakness (generalized): Secondary | ICD-10-CM | POA: Diagnosis not present

## 2019-12-08 DIAGNOSIS — Z87891 Personal history of nicotine dependence: Secondary | ICD-10-CM | POA: Diagnosis not present

## 2019-12-08 DIAGNOSIS — Z96652 Presence of left artificial knee joint: Secondary | ICD-10-CM | POA: Diagnosis present

## 2019-12-08 DIAGNOSIS — M179 Osteoarthritis of knee, unspecified: Secondary | ICD-10-CM | POA: Diagnosis not present

## 2019-12-08 DIAGNOSIS — Z79899 Other long term (current) drug therapy: Secondary | ICD-10-CM

## 2019-12-08 DIAGNOSIS — Z96642 Presence of left artificial hip joint: Secondary | ICD-10-CM | POA: Diagnosis not present

## 2019-12-08 DIAGNOSIS — Z20822 Contact with and (suspected) exposure to covid-19: Secondary | ICD-10-CM | POA: Diagnosis present

## 2019-12-08 DIAGNOSIS — Z6841 Body Mass Index (BMI) 40.0 and over, adult: Secondary | ICD-10-CM

## 2019-12-08 DIAGNOSIS — Z471 Aftercare following joint replacement surgery: Secondary | ICD-10-CM | POA: Diagnosis not present

## 2019-12-08 DIAGNOSIS — F329 Major depressive disorder, single episode, unspecified: Secondary | ICD-10-CM | POA: Diagnosis present

## 2019-12-08 DIAGNOSIS — Z96649 Presence of unspecified artificial hip joint: Secondary | ICD-10-CM

## 2019-12-08 DIAGNOSIS — G4700 Insomnia, unspecified: Secondary | ICD-10-CM | POA: Diagnosis not present

## 2019-12-08 DIAGNOSIS — R609 Edema, unspecified: Secondary | ICD-10-CM | POA: Diagnosis not present

## 2019-12-08 DIAGNOSIS — G609 Hereditary and idiopathic neuropathy, unspecified: Secondary | ICD-10-CM | POA: Diagnosis present

## 2019-12-08 DIAGNOSIS — R41841 Cognitive communication deficit: Secondary | ICD-10-CM | POA: Diagnosis not present

## 2019-12-08 HISTORY — PX: TOTAL HIP ARTHROPLASTY: SHX124

## 2019-12-08 LAB — TYPE AND SCREEN
ABO/RH(D): O POS
Antibody Screen: NEGATIVE

## 2019-12-08 LAB — SARS CORONAVIRUS 2 BY RT PCR (HOSPITAL ORDER, PERFORMED IN ~~LOC~~ HOSPITAL LAB): SARS Coronavirus 2: NEGATIVE

## 2019-12-08 SURGERY — ARTHROPLASTY, HIP, TOTAL, ANTERIOR APPROACH
Anesthesia: General | Site: Hip | Laterality: Left

## 2019-12-08 MED ORDER — HYDROMORPHONE HCL 2 MG/ML IJ SOLN
INTRAMUSCULAR | Status: AC
  Filled 2019-12-08: qty 1

## 2019-12-08 MED ORDER — PHENYLEPHRINE 40 MCG/ML (10ML) SYRINGE FOR IV PUSH (FOR BLOOD PRESSURE SUPPORT)
PREFILLED_SYRINGE | INTRAVENOUS | Status: AC
  Filled 2019-12-08: qty 10

## 2019-12-08 MED ORDER — DEXAMETHASONE SODIUM PHOSPHATE 10 MG/ML IJ SOLN
10.0000 mg | Freq: Once | INTRAMUSCULAR | Status: AC
  Administered 2019-12-09: 10 mg via INTRAVENOUS
  Filled 2019-12-08: qty 1

## 2019-12-08 MED ORDER — FUROSEMIDE 40 MG PO TABS
40.0000 mg | ORAL_TABLET | Freq: Two times a day (BID) | ORAL | Status: DC
  Administered 2019-12-09 – 2019-12-11 (×4): 40 mg via ORAL
  Filled 2019-12-08 (×6): qty 1

## 2019-12-08 MED ORDER — ONDANSETRON HCL 4 MG PO TABS: 4.0000 mg | ORAL_TABLET | Freq: Four times a day (QID) | ORAL | Status: DC | PRN

## 2019-12-08 MED ORDER — DEXAMETHASONE SODIUM PHOSPHATE 10 MG/ML IJ SOLN
INTRAMUSCULAR | Status: AC
  Filled 2019-12-08: qty 1

## 2019-12-08 MED ORDER — METOCLOPRAMIDE HCL 5 MG/ML IJ SOLN: 5.0000 mg | Freq: Three times a day (TID) | INTRAMUSCULAR | Status: DC | PRN

## 2019-12-08 MED ORDER — FENTANYL CITRATE (PF) 100 MCG/2ML IJ SOLN
INTRAMUSCULAR | Status: AC
  Filled 2019-12-08: qty 2

## 2019-12-08 MED ORDER — POLYVINYL ALCOHOL 1.4 % OP SOLN
1.0000 [drp] | OPHTHALMIC | Status: DC | PRN
  Administered 2019-12-08: 1 [drp] via OPHTHALMIC
  Filled 2019-12-08: qty 15

## 2019-12-08 MED ORDER — BISACODYL 10 MG RE SUPP: 10.0000 mg | Freq: Every day | RECTAL | Status: DC | PRN

## 2019-12-08 MED ORDER — HYDRALAZINE HCL 20 MG/ML IJ SOLN
INTRAMUSCULAR | Status: DC | PRN
  Administered 2019-12-08: 10 mg via INTRAVENOUS

## 2019-12-08 MED ORDER — HYDROMORPHONE HCL 1 MG/ML IJ SOLN
INTRAMUSCULAR | Status: AC
  Administered 2019-12-08: 0.5 mg via INTRAVENOUS
  Filled 2019-12-08: qty 1

## 2019-12-08 MED ORDER — ONDANSETRON HCL 4 MG/2ML IJ SOLN: 4.0000 mg | Freq: Four times a day (QID) | INTRAMUSCULAR | Status: DC | PRN

## 2019-12-08 MED ORDER — ONDANSETRON HCL 4 MG/2ML IJ SOLN
INTRAMUSCULAR | Status: AC
  Filled 2019-12-08: qty 2

## 2019-12-08 MED ORDER — MORPHINE SULFATE (PF) 2 MG/ML IV SOLN: 0.5000 mg | INTRAVENOUS | Status: DC | PRN

## 2019-12-08 MED ORDER — CHLORHEXIDINE GLUCONATE 0.12 % MT SOLN
15.0000 mL | Freq: Once | OROMUCOSAL | Status: AC
  Administered 2019-12-08: 15 mL via OROMUCOSAL

## 2019-12-08 MED ORDER — TEMAZEPAM 15 MG PO CAPS
30.0000 mg | ORAL_CAPSULE | Freq: Every day | ORAL | Status: DC
  Administered 2019-12-08 – 2019-12-10 (×3): 30 mg via ORAL
  Filled 2019-12-08 (×3): qty 2

## 2019-12-08 MED ORDER — DEXAMETHASONE SODIUM PHOSPHATE 10 MG/ML IJ SOLN
8.0000 mg | Freq: Once | INTRAMUSCULAR | Status: AC
  Administered 2019-12-08: 8 mg via INTRAVENOUS

## 2019-12-08 MED ORDER — VENLAFAXINE HCL 75 MG PO TABS
75.0000 mg | ORAL_TABLET | Freq: Two times a day (BID) | ORAL | Status: DC
  Administered 2019-12-08 – 2019-12-11 (×6): 75 mg via ORAL
  Filled 2019-12-08 (×6): qty 1

## 2019-12-08 MED ORDER — FENTANYL CITRATE (PF) 100 MCG/2ML IJ SOLN
INTRAMUSCULAR | Status: AC
  Administered 2019-12-08: 50 ug via INTRAVENOUS
  Filled 2019-12-08: qty 2

## 2019-12-08 MED ORDER — MAGNESIUM CITRATE PO SOLN: 1.0000 | Freq: Once | ORAL | Status: DC | PRN

## 2019-12-08 MED ORDER — KETOROLAC TROMETHAMINE 30 MG/ML IJ SOLN: 30.0000 mg | Freq: Once | INTRAMUSCULAR | Status: DC

## 2019-12-08 MED ORDER — TRANEXAMIC ACID-NACL 1000-0.7 MG/100ML-% IV SOLN
1000.0000 mg | INTRAVENOUS | Status: AC
  Administered 2019-12-08: 1000 mg via INTRAVENOUS
  Filled 2019-12-08: qty 100

## 2019-12-08 MED ORDER — MENTHOL 3 MG MT LOZG: 1.0000 | LOZENGE | OROMUCOSAL | Status: DC | PRN

## 2019-12-08 MED ORDER — OXYCODONE HCL 5 MG PO TABS
5.0000 mg | ORAL_TABLET | ORAL | Status: DC | PRN
  Administered 2019-12-08: 5 mg via ORAL
  Administered 2019-12-09 – 2019-12-10 (×6): 10 mg via ORAL
  Filled 2019-12-08 (×6): qty 2
  Filled 2019-12-08: qty 1
  Filled 2019-12-08 (×3): qty 2

## 2019-12-08 MED ORDER — FENTANYL CITRATE (PF) 100 MCG/2ML IJ SOLN
25.0000 ug | INTRAMUSCULAR | Status: DC | PRN
  Administered 2019-12-08: 50 ug via INTRAVENOUS

## 2019-12-08 MED ORDER — FENTANYL CITRATE (PF) 100 MCG/2ML IJ SOLN
INTRAMUSCULAR | Status: DC | PRN
  Administered 2019-12-08: 100 ug via INTRAVENOUS
  Administered 2019-12-08 (×3): 50 ug via INTRAVENOUS
  Administered 2019-12-08 (×2): 25 ug via INTRAVENOUS

## 2019-12-08 MED ORDER — LACTATED RINGERS IV SOLN: INTRAVENOUS | Status: DC

## 2019-12-08 MED ORDER — PHENYLEPHRINE 40 MCG/ML (10ML) SYRINGE FOR IV PUSH (FOR BLOOD PRESSURE SUPPORT)
PREFILLED_SYRINGE | INTRAVENOUS | Status: DC | PRN
  Administered 2019-12-08: 80 ug via INTRAVENOUS

## 2019-12-08 MED ORDER — DOCUSATE SODIUM 100 MG PO CAPS
100.0000 mg | ORAL_CAPSULE | Freq: Two times a day (BID) | ORAL | Status: DC
  Administered 2019-12-08 – 2019-12-11 (×6): 100 mg via ORAL
  Filled 2019-12-08 (×6): qty 1

## 2019-12-08 MED ORDER — ACETAMINOPHEN 10 MG/ML IV SOLN
1000.0000 mg | Freq: Four times a day (QID) | INTRAVENOUS | Status: DC
  Administered 2019-12-08: 1000 mg via INTRAVENOUS
  Filled 2019-12-08: qty 100

## 2019-12-08 MED ORDER — ACETAMINOPHEN 325 MG PO TABS
325.0000 mg | ORAL_TABLET | Freq: Four times a day (QID) | ORAL | Status: DC | PRN
  Administered 2019-12-10: 650 mg via ORAL
  Filled 2019-12-08: qty 2

## 2019-12-08 MED ORDER — HYDRALAZINE HCL 20 MG/ML IJ SOLN
INTRAMUSCULAR | Status: AC
  Filled 2019-12-08: qty 1

## 2019-12-08 MED ORDER — GABAPENTIN 300 MG PO CAPS
600.0000 mg | ORAL_CAPSULE | Freq: Every day | ORAL | Status: DC
  Administered 2019-12-09 – 2019-12-11 (×3): 600 mg via ORAL
  Filled 2019-12-08 (×3): qty 2

## 2019-12-08 MED ORDER — LIDOCAINE 2% (20 MG/ML) 5 ML SYRINGE
INTRAMUSCULAR | Status: AC
  Filled 2019-12-08: qty 5

## 2019-12-08 MED ORDER — POVIDONE-IODINE 10 % EX SWAB
2.0000 "application " | Freq: Once | CUTANEOUS | Status: AC
  Administered 2019-12-08: 2 via TOPICAL

## 2019-12-08 MED ORDER — KETOROLAC TROMETHAMINE 30 MG/ML IJ SOLN: 15.0000 mg | Freq: Once | INTRAMUSCULAR | Status: AC

## 2019-12-08 MED ORDER — 0.9 % SODIUM CHLORIDE (POUR BTL) OPTIME
TOPICAL | Status: DC | PRN
  Administered 2019-12-08: 1000 mL

## 2019-12-08 MED ORDER — SODIUM CHLORIDE 0.9 % IV SOLN: INTRAVENOUS | Status: DC

## 2019-12-08 MED ORDER — CEFAZOLIN SODIUM-DEXTROSE 2-4 GM/100ML-% IV SOLN
2.0000 g | INTRAVENOUS | Status: AC
  Administered 2019-12-08: 2 g via INTRAVENOUS
  Filled 2019-12-08: qty 100

## 2019-12-08 MED ORDER — CEFAZOLIN SODIUM-DEXTROSE 2-4 GM/100ML-% IV SOLN
2.0000 g | Freq: Four times a day (QID) | INTRAVENOUS | Status: AC
  Administered 2019-12-08 – 2019-12-09 (×2): 2 g via INTRAVENOUS
  Filled 2019-12-08 (×2): qty 100

## 2019-12-08 MED ORDER — PROPOFOL 10 MG/ML IV BOLUS
INTRAVENOUS | Status: AC
  Filled 2019-12-08: qty 20

## 2019-12-08 MED ORDER — GABAPENTIN 600 MG PO TABS: 600.0000 mg | ORAL_TABLET | ORAL | Status: DC

## 2019-12-08 MED ORDER — HYDROMORPHONE HCL 1 MG/ML IJ SOLN
0.2500 mg | INTRAMUSCULAR | Status: DC | PRN
  Administered 2019-12-08 (×2): 0.5 mg via INTRAVENOUS

## 2019-12-08 MED ORDER — ATENOLOL 50 MG PO TABS
50.0000 mg | ORAL_TABLET | Freq: Every day | ORAL | Status: DC
  Administered 2019-12-09 – 2019-12-11 (×3): 50 mg via ORAL
  Filled 2019-12-08 (×3): qty 1

## 2019-12-08 MED ORDER — PHENOL 1.4 % MT LIQD: 1.0000 | OROMUCOSAL | Status: DC | PRN

## 2019-12-08 MED ORDER — LIDOCAINE 2% (20 MG/ML) 5 ML SYRINGE
INTRAMUSCULAR | Status: DC | PRN
  Administered 2019-12-08: 60 mg via INTRAVENOUS

## 2019-12-08 MED ORDER — ASPIRIN EC 325 MG PO TBEC
325.0000 mg | DELAYED_RELEASE_TABLET | Freq: Two times a day (BID) | ORAL | Status: DC
  Administered 2019-12-09 – 2019-12-11 (×5): 325 mg via ORAL
  Filled 2019-12-08 (×5): qty 1

## 2019-12-08 MED ORDER — KETOROLAC TROMETHAMINE 15 MG/ML IJ SOLN
INTRAMUSCULAR | Status: AC
  Administered 2019-12-08: 15 mg
  Filled 2019-12-08: qty 1

## 2019-12-08 MED ORDER — ATORVASTATIN CALCIUM 20 MG PO TABS
20.0000 mg | ORAL_TABLET | Freq: Every day | ORAL | Status: DC
  Administered 2019-12-09 – 2019-12-11 (×3): 20 mg via ORAL
  Filled 2019-12-08 (×3): qty 1

## 2019-12-08 MED ORDER — BUPIVACAINE-EPINEPHRINE (PF) 0.25% -1:200000 IJ SOLN
INTRAMUSCULAR | Status: DC | PRN
  Administered 2019-12-08: 30 mL via PERINEURAL

## 2019-12-08 MED ORDER — PROPOFOL 10 MG/ML IV BOLUS
INTRAVENOUS | Status: DC | PRN
  Administered 2019-12-08: 200 mg via INTRAVENOUS

## 2019-12-08 MED ORDER — WATER FOR IRRIGATION, STERILE IR SOLN
Status: DC | PRN
  Administered 2019-12-08: 2000 mL

## 2019-12-08 MED ORDER — BUPIVACAINE-EPINEPHRINE 0.25% -1:200000 IJ SOLN
INTRAMUSCULAR | Status: AC
  Filled 2019-12-08: qty 1

## 2019-12-08 MED ORDER — GABAPENTIN 400 MG PO CAPS
1200.0000 mg | ORAL_CAPSULE | Freq: Every day | ORAL | Status: DC
  Administered 2019-12-08 – 2019-12-10 (×3): 1200 mg via ORAL
  Filled 2019-12-08 (×3): qty 3

## 2019-12-08 MED ORDER — LOSARTAN POTASSIUM 25 MG PO TABS
25.0000 mg | ORAL_TABLET | Freq: Every day | ORAL | Status: DC
  Administered 2019-12-09 – 2019-12-11 (×3): 25 mg via ORAL
  Filled 2019-12-08 (×3): qty 1

## 2019-12-08 MED ORDER — POLYETHYLENE GLYCOL 3350 17 G PO PACK
17.0000 g | PACK | Freq: Every day | ORAL | Status: DC | PRN
  Filled 2019-12-08: qty 1

## 2019-12-08 MED ORDER — ONDANSETRON HCL 4 MG/2ML IJ SOLN
INTRAMUSCULAR | Status: DC | PRN
  Administered 2019-12-08: 4 mg via INTRAVENOUS

## 2019-12-08 MED ORDER — OXYCODONE HCL 5 MG PO TABS
10.0000 mg | ORAL_TABLET | ORAL | Status: DC | PRN
  Administered 2019-12-09 – 2019-12-11 (×4): 10 mg via ORAL
  Filled 2019-12-08: qty 2

## 2019-12-08 MED ORDER — ROPINIROLE HCL 1 MG PO TABS
1.0000 mg | ORAL_TABLET | Freq: Two times a day (BID) | ORAL | Status: DC
  Administered 2019-12-08 – 2019-12-11 (×6): 1 mg via ORAL
  Filled 2019-12-08 (×6): qty 1

## 2019-12-08 MED ORDER — ORAL CARE MOUTH RINSE: 15.0000 mL | Freq: Once | OROMUCOSAL | Status: AC

## 2019-12-08 MED ORDER — ONDANSETRON HCL 4 MG/2ML IJ SOLN: 4.0000 mg | Freq: Once | INTRAMUSCULAR | Status: DC | PRN

## 2019-12-08 MED ORDER — METOCLOPRAMIDE HCL 5 MG PO TABS: 5.0000 mg | ORAL_TABLET | Freq: Three times a day (TID) | ORAL | Status: DC | PRN

## 2019-12-08 SURGICAL SUPPLY — 51 items
BAG DECANTER FOR FLEXI CONT (MISCELLANEOUS) IMPLANT
BAG SPEC THK2 15X12 ZIP CLS (MISCELLANEOUS)
BAG ZIPLOCK 12X15 (MISCELLANEOUS) IMPLANT
BLADE SAG 18X100X1.27 (BLADE) ×3 IMPLANT
CLOSURE STERI-STRIP 1/2X4 (GAUZE/BANDAGES/DRESSINGS) ×1
CLOSURE WOUND 1/2 X4 (GAUZE/BANDAGES/DRESSINGS) ×1
CLSR STERI-STRIP ANTIMIC 1/2X4 (GAUZE/BANDAGES/DRESSINGS) ×1 IMPLANT
COVER PERINEAL POST (MISCELLANEOUS) ×3 IMPLANT
COVER SURGICAL LIGHT HANDLE (MISCELLANEOUS) ×3 IMPLANT
COVER WAND RF STERILE (DRAPES) IMPLANT
CUP ACETBLR 48 OD SECTOR II (Hips) ×2 IMPLANT
DECANTER SPIKE VIAL GLASS SM (MISCELLANEOUS) ×3 IMPLANT
DRAPE STERI IOBAN 125X83 (DRAPES) ×3 IMPLANT
DRAPE U-SHAPE 47X51 STRL (DRAPES) ×6 IMPLANT
DRESSING AQUACEL AG SP 3.5X10 (GAUZE/BANDAGES/DRESSINGS) IMPLANT
DRSG ADAPTIC 3X8 NADH LF (GAUZE/BANDAGES/DRESSINGS) ×3 IMPLANT
DRSG AQUACEL AG ADV 3.5X10 (GAUZE/BANDAGES/DRESSINGS) ×3 IMPLANT
DRSG AQUACEL AG SP 3.5X10 (GAUZE/BANDAGES/DRESSINGS) ×3
DURAPREP 26ML APPLICATOR (WOUND CARE) ×3 IMPLANT
ELECT REM PT RETURN 15FT ADLT (MISCELLANEOUS) ×3 IMPLANT
EVACUATOR 1/8 PVC DRAIN (DRAIN) IMPLANT
FEM STEM 12/14 TAPER SZ 4 HIP (Orthopedic Implant) ×3 IMPLANT
FEMORAL STEM 12/14 TPR SZ4 HIP (Orthopedic Implant) IMPLANT
GLOVE BIO SURGEON STRL SZ 6 (GLOVE) IMPLANT
GLOVE BIO SURGEON STRL SZ7 (GLOVE) IMPLANT
GLOVE BIO SURGEON STRL SZ8 (GLOVE) ×3 IMPLANT
GLOVE BIOGEL PI IND STRL 6.5 (GLOVE) IMPLANT
GLOVE BIOGEL PI IND STRL 7.0 (GLOVE) IMPLANT
GLOVE BIOGEL PI IND STRL 8 (GLOVE) ×1 IMPLANT
GLOVE BIOGEL PI INDICATOR 6.5 (GLOVE)
GLOVE BIOGEL PI INDICATOR 7.0 (GLOVE)
GLOVE BIOGEL PI INDICATOR 8 (GLOVE) ×2
GOWN STRL REUS W/TWL LRG LVL3 (GOWN DISPOSABLE) ×3 IMPLANT
GOWN STRL REUS W/TWL XL LVL3 (GOWN DISPOSABLE) IMPLANT
HEAD FEM STD 28X+1.5 STRL (Hips) ×2 IMPLANT
HOLDER FOLEY CATH W/STRAP (MISCELLANEOUS) ×3 IMPLANT
KIT TURNOVER KIT A (KITS) IMPLANT
LINER MARATHON 28 48 (Hips) ×2 IMPLANT
MANIFOLD NEPTUNE II (INSTRUMENTS) ×3 IMPLANT
PACK ANTERIOR HIP CUSTOM (KITS) ×3 IMPLANT
PENCIL SMOKE EVACUATOR COATED (MISCELLANEOUS) ×3 IMPLANT
STRIP CLOSURE SKIN 1/2X4 (GAUZE/BANDAGES/DRESSINGS) ×2 IMPLANT
SUT ETHIBOND NAB CT1 #1 30IN (SUTURE) ×3 IMPLANT
SUT MNCRL AB 4-0 PS2 18 (SUTURE) ×3 IMPLANT
SUT STRATAFIX 0 PDS 27 VIOLET (SUTURE) ×3
SUT VIC AB 2-0 CT1 27 (SUTURE) ×6
SUT VIC AB 2-0 CT1 TAPERPNT 27 (SUTURE) ×2 IMPLANT
SUTURE STRATFX 0 PDS 27 VIOLET (SUTURE) ×1 IMPLANT
SYR 50ML LL SCALE MARK (SYRINGE) IMPLANT
TRAY FOLEY MTR SLVR 16FR STAT (SET/KITS/TRAYS/PACK) ×3 IMPLANT
YANKAUER SUCT BULB TIP 10FT TU (MISCELLANEOUS) ×3 IMPLANT

## 2019-12-08 NOTE — Op Note (Signed)
OPERATIVE REPORT- TOTAL HIP ARTHROPLASTY   PREOPERATIVE DIAGNOSIS: Osteoarthritis of the Left hip.   POSTOPERATIVE DIAGNOSIS: Osteoarthritis of the Left  hip.   PROCEDURE: Left total hip arthroplasty, anterior approach.   SURGEON: Gaynelle Arabian, MD   ASSISTANT: Griffith Citron, PA-C  ANESTHESIA:  General  ESTIMATED BLOOD LOSS:-550 mL    DRAINS: Hemovac x1.   COMPLICATIONS: None   CONDITION: PACU - hemodynamically stable.   BRIEF CLINICAL NOTE: Jessica Serrano is a 79 y.o. female who has advanced end-  stage arthritis of their Left  hip with progressively worsening pain and  dysfunction.The patient has failed nonoperative management and presents for  total hip arthroplasty.   PROCEDURE IN DETAIL: After successful administration of spinal  anesthetic, the traction boots for the [facility] bed were placed on both  feet and the patient was placed onto the [facility] bed, boots placed into the leg  holders. The Left hip was then isolated from the perineum with plastic  drapes and prepped and draped in the usual sterile fashion. ASIS and  greater trochanter were marked and a oblique incision was made, starting  at about 1 cm lateral and 2 cm distal to the ASIS and coursing towards  the anterior cortex of the femur. The skin was cut with a 10 blade  through subcutaneous tissue to the level of the fascia overlying the  tensor fascia lata muscle. The fascia was then incised in line with the  incision at the junction of the anterior third and posterior 2/3rd. The  muscle was teased off the fascia and then the interval between the TFL  and the rectus was developed. The Hohmann retractor was then placed at  the top of the femoral neck over the capsule. The vessels overlying the  capsule were cauterized and the fat on top of the capsule was removed.  A Hohmann retractor was then placed anterior underneath the rectus  femoris to give exposure to the entire anterior capsule. A T-shaped   capsulotomy was performed. The edges were tagged and the femoral head  was identified.       Osteophytes are removed off the superior acetabulum.  The femoral neck was then cut in situ with an oscillating saw. Traction  was then applied to the left lower extremity utilizing the [facility]  traction. The femoral head was then removed. Retractors were placed  around the acetabulum and then circumferential removal of the labrum was  performed. Osteophytes were also removed. Reaming starts at 45 mm to  medialize and  Increased in 2 mm increments to 47 mm. We reamed in  approximately 40 degrees of abduction, 20 degrees anteversion. A 48 mm  pinnacle acetabular shell was then impacted in anatomic position under  fluoroscopic guidance with excellent purchase. We did not need to place  any additional dome screws. A 28 mm neutral + 4 marathon liner was then  placed into the acetabular shell.       The femoral lift was then placed along the lateral aspect of the femur  just distal to the vastus ridge. The leg was  externally rotated and capsule  was stripped off the inferior aspect of the femoral neck down to the  level of the lesser trochanter, this was done with electrocautery. The femur was lifted after this was performed. The  leg was then placed in an extended and adducted position essentially delivering the femur. We also removed the capsule superiorly and the piriformis from the piriformis  fossa to gain excellent exposure of the  proximal femur. Rongeur was used to remove some cancellous bone to get  into the lateral portion of the proximal femur for placement of the  initial starter reamer. The starter broaches was placed  the starter broach  and was shown to go down the center of the canal. Broaching  with the Actis system was then performed starting at size 0  coursing  Up to size 4. A size 4 had excellent torsional and rotational  and axial stability. The trial high offset neck was then placed   with a 28 + 1.5 trial head. The hip was then reduced. We confirmed that  the stem was in the canal both on AP and lateral x-rays. It also has excellent sizing. The hip was reduced with outstanding stability through full extension and full external rotation.. AP pelvis was taken and the leg lengths were measured and found to be equal. Hip was then dislocated again and the femoral head and neck removed. The  femoral broach was removed. Size 4 Actis stem with a high offset  neck was then impacted into the femur following native anteversion. Has  excellent purchase in the canal. Excellent torsional and rotational and  axial stability. It is confirmed to be in the canal on AP and lateral  fluoroscopic views. The 28 + 1.5 ceramic head was placed and the hip  reduced with outstanding stability. Again AP pelvis was taken and it  confirmed that the leg lengths were equal. The wound was then copiously  irrigated with saline solution and the capsule reattached and repaired  with Ethibond suture. 30 ml of .25% Bupivicaine was  injected into the capsule and into the edge of the tensor fascia lata as well as subcutaneous tissue. The fascia overlying the tensor fascia lata was then closed with a running #1 V-Loc. Subcu was closed with interrupted 2-0 Vicryl and subcuticular running 4-0 Monocryl. Incision was cleaned  and dried. Steri-Strips and a bulky sterile dressing applied. Hemovac  drain was hooked to suction and then the patient was awakened and transported to  recovery in stable condition.        Please note that a surgical assistant was a medical necessity for this procedure to perform it in a safe and expeditious manner. Assistant was necessary to provide appropriate retraction of vital neurovascular structures and to prevent femoral fracture and allow for anatomic placement of the prosthesis.  Gaynelle Arabian, M.D.

## 2019-12-08 NOTE — Plan of Care (Signed)

## 2019-12-08 NOTE — Transfer of Care (Signed)
Immediate Anesthesia Transfer of Care Note  Patient: Jessica Serrano  Procedure(s) Performed: TOTAL HIP ARTHROPLASTY ANTERIOR APPROACH (Left Hip)  Patient Location: PACU  Anesthesia Type:General  Level of Consciousness: awake, alert  and oriented  Airway & Oxygen Therapy: Patient Spontanous Breathing and Patient connected to face mask oxygen  Post-op Assessment: Report given to RN and Post -op Vital signs reviewed and stable  Post vital signs: Reviewed and stable  Last Vitals:  Vitals Value Taken Time  BP 110/39 12/08/19 1615  Temp    Pulse 82 12/08/19 1624  Resp 17 12/08/19 1624  SpO2 100 % 12/08/19 1624  Vitals shown include unvalidated device data.  Last Pain:  Vitals:   12/08/19 1302  TempSrc:   PainSc: 0-No pain         Complications: No apparent anesthesia complications

## 2019-12-08 NOTE — Interval H&P Note (Signed)
History and Physical Interval Note:  12/08/2019 1:06 PM  Jessica Serrano  has presented today for surgery, with the diagnosis of left hip osteoarthritis.  The various methods of treatment have been discussed with the patient and family. After consideration of risks, benefits and other options for treatment, the patient has consented to  Procedure(s) with comments: [place] (Left) - 155min as a surgical intervention.  The patient's history has been reviewed, patient examined, no change in status, stable for surgery.  I have reviewed the patient's chart and labs.  Questions were answered to the patient's satisfaction.     Pilar Plate Christy Ehrsam

## 2019-12-08 NOTE — Discharge Instructions (Addendum)
Frank Aluisio, MD Total Joint Specialist [facility] [address] [place], [place] [address] [phone]  ANTERIOR APPROACH TOTAL HIP REPLACEMENT POSTOPERATIVE DIRECTIONS     Hip Rehabilitation, Guidelines Following Surgery  The results of a hip operation are greatly improved after range of motion and muscle strengthening exercises. Follow all safety measures which are given to protect your hip. If any of these exercises cause increased pain or swelling in your joint, decrease the amount until you are comfortable again. Then slowly increase the exercises. Call your caregiver if you have problems or questions.   HOME CARE INSTRUCTIONS  . Remove items at home which could result in a fall. This includes throw rugs or furniture in walking pathways.   ICE to the affected hip as frequently as 20-30 minutes an hour and then as needed for pain and swelling. Continue to use ice on the hip for pain and swelling from surgery. You may notice swelling that will progress down to the foot and ankle. This is normal after surgery. Elevate the leg when you are not up walking on it.    Continue to use the breathing machine which will help keep your temperature down.  It is common for your temperature to cycle up and down following surgery, especially at night when you are not up moving around and exerting yourself.  The breathing machine keeps your lungs expanded and your temperature down.  DIET You may resume your previous home diet once your are discharged from the hospital.  DRESSING / WOUND CARE / SHOWERING . You have an adhesive waterproof bandage over the incision. Leave this in place until your first follow-up appointment. Once you remove this you will not need to place another bandage.  . You may begin showering 3 days following surgery, but do not submerge the incision under water.  ACTIVITY . For the first 3-5 days, it is important to rest and keep the operative  leg elevated. You should, as a general rule, rest for 50 minutes and walk/stretch for 10 minutes per hour. After 5 days, you may slowly increase activity as tolerated.  . Perform the exercises you were provided twice a day for about 15-20 minutes each session. Begin these 2 days following surgery. . Walk with your walker as instructed. Use the walker until you are comfortable transitioning to a cane. Walk with the cane in the opposite hand of the operative leg. You may discontinue the cane once you are comfortable and walking steadily. . Avoid periods of inactivity such as sitting longer than an hour when not asleep. This helps prevent blood clots.  . Do not drive a car for 6 weeks or until released by your surgeon.  . Do not drive while taking narcotics.  TED HOSE STOCKINGS Wear the elastic stockings on both legs for three weeks following surgery during the day. You may remove them at night while sleeping.  WEIGHT BEARING Weight bearing as tolerated with assist device (walker, cane, etc) as directed, use it as long as suggested by your surgeon or therapist, typically at least 4-6 weeks.  POSTOPERATIVE CONSTIPATION PROTOCOL Constipation - defined medically as fewer than three stools per week and severe constipation as less than one stool per week.  One of the most common issues patients have following surgery is constipation.  Even if you have a regular bowel pattern at home, your normal regimen is likely to be disrupted due to multiple reasons following surgery.  Combination of anesthesia, postoperative narcotics,   change in appetite and fluid intake all can affect your bowels.  In order to avoid complications following surgery, here are some recommendations in order to help you during your recovery period.  . Colace (docusate) - Pick up an over-the-counter form of Colace or another stool softener and take twice a day as long as you are requiring postoperative pain medications.  Take with a full  glass of water daily.  If you experience loose stools or diarrhea, hold the colace until you stool forms back up.  If your symptoms do not get better within 1 week or if they get worse, check with your doctor. . Dulcolax (bisacodyl) - Pick up over-the-counter and take as directed by the product packaging as needed to assist with the movement of your bowels.  Take with a full glass of water.  Use this product as needed if not relieved by Colace only.  . MiraLax (polyethylene glycol) - Pick up over-the-counter to have on hand.  MiraLax is a solution that will increase the amount of water in your bowels to assist with bowel movements.  Take as directed and can mix with a glass of water, juice, soda, coffee, or tea.  Take if you go more than two days without a movement.Do not use MiraLax more than once per day. Call your doctor if you are still constipated or irregular after using this medication for 7 days in a row.  If you continue to have problems with postoperative constipation, please contact the office for further assistance and recommendations.  If you experience "the worst abdominal pain ever" or develop nausea or vomiting, please contact the office immediatly for further recommendations for treatment.  ITCHING  If you experience itching with your medications, try taking only a single pain pill, or even half a pain pill at a time.  You can also use Benadryl over the counter for itching or also to help with sleep.   MEDICATIONS See your medication summary on the "After Visit Summary" that the nursing staff will review with you prior to discharge.  You may have some home medications which will be placed on hold until you complete the course of blood thinner medication.  It is important for you to complete the blood thinner medication as prescribed by your surgeon.  Continue your approved medications as instructed at time of discharge.  PRECAUTIONS If you experience chest pain or shortness of breath -  call 911 immediately for transfer to the hospital emergency department.  If you develop a fever greater that 101 F, purulent drainage from wound, increased redness or drainage from wound, foul odor from the wound/dressing, or calf pain - CONTACT YOUR SURGEON.                                                   FOLLOW-UP APPOINTMENTS Make sure you keep all of your appointments after your operation with your surgeon and caregivers. You should call the office at the above phone number and make an appointment for approximately two weeks after the date of your surgery or on the date instructed by your surgeon outlined in the "After Visit Summary".  RANGE OF MOTION AND STRENGTHENING EXERCISES  These exercises are designed to help you keep full movement of your hip joint. Follow your caregiver's or physical therapist's instructions. Perform all exercises about fifteen times, three   times per day or as directed. Exercise both hips, even if you have had only one joint replacement. These exercises can be done on a training (exercise) mat, on the floor, on a table or on a bed. Use whatever works the best and is most comfortable for you. Use music or television while you are exercising so that the exercises are a pleasant break in your day. This will make your life better with the exercises acting as a break in routine you can look forward to.  . Lying on your back, slowly slide your foot toward your buttocks, raising your knee up off the floor. Then slowly slide your foot back down until your leg is straight again.  . Lying on your back spread your legs as far apart as you can without causing discomfort.  . Lying on your side, raise your upper leg and foot straight up from the floor as far as is comfortable. Slowly lower the leg and repeat.  . Lying on your back, tighten up the muscle in the front of your thigh (quadriceps muscles). You can do this by keeping your leg straight and trying to raise your heel off the  floor. This helps strengthen the largest muscle supporting your knee.  . Lying on your back, tighten up the muscles of your buttocks both with the legs straight and with the knee bent at a comfortable angle while keeping your heel on the floor.   IF YOU ARE TRANSFERRED TO A SKILLED REHAB FACILITY If the patient is transferred to a skilled rehab facility following release from the hospital, a list of the current medications will be sent to the facility for the patient to continue.  When discharged from the skilled rehab facility, please have the facility set up the patient's Home Health Physical Therapy prior to being released. Also, the skilled facility will be responsible for providing the patient with their medications at time of release from the facility to include their pain medication, the muscle relaxants, and their blood thinner medication. If the patient is still at the rehab facility at time of the two week follow up appointment, the skilled rehab facility will also need to assist the patient in arranging follow up appointment in our office and any transportation needs.  MAKE SURE YOU:  . Understand these instructions.  . Get help right away if you are not doing well or get worse.    DENTAL ANTIBIOTICS:  In most cases prophylactic antibiotics for Dental procdeures after total joint surgery are not necessary.  Exceptions are as follows:  1. History of prior total joint infection  2. Severely immunocompromised (Organ Transplant, cancer chemotherapy, Rheumatoid biologic meds such as Humera)  3. Poorly controlled diabetes (A1C &gt; 8.0, blood glucose over 200)  If you have one of these conditions, contact your surgeon for an antibiotic prescription, prior to your dental procedure.    Pick up stool softner and laxative for home use following surgery while on pain medications. Do not submerge incision under water. Please use good hand washing techniques while changing dressing each  day. May shower starting three days after surgery. Please use a clean towel to pat the incision dry following showers. Continue to use ice for pain and swelling after surgery. Do not use any lotions or creams on the incision until instructed by your surgeon.  

## 2019-12-08 NOTE — Care Plan (Signed)
Ortho Bundle Case Management Note  Patient Details  Name: Jessica Serrano MRN: [id number] Date of Birth: 12-26-1940  L THA on 12-08-19 DCP:  [place] DME:  No needs. Has a RW and 3-in-1.    DME Arranged:  N/A DME Agency:  NA  HH Arranged:  NA HH Agency:  NA  Additional Comments: Please contact me with any questions of if this plan should need to change.  Marianne Sofia, RN,CCM [facility]  [phone] 12/08/2019, 3:07 PM

## 2019-12-08 NOTE — Anesthesia Procedure Notes (Signed)
Procedure Name: LMA Insertion Date/Time: 12/08/2019 2:29 PM Performed by: Niel Hummer, CRNA Pre-anesthesia Checklist: Patient identified, Emergency Drugs available, Suction available and Patient being monitored Patient Re-evaluated:Patient Re-evaluated prior to induction Oxygen Delivery Method: Circle system utilized Preoxygenation: Pre-oxygenation with 100% oxygen Induction Type: IV induction LMA: LMA with gastric port inserted LMA Size: 4.0 Number of attempts: 1 Dental Injury: Teeth and Oropharynx as per pre-operative assessment

## 2019-12-08 NOTE — Anesthesia Postprocedure Evaluation (Signed)
Anesthesia Post Note  Patient: [place]  Procedure(s) Performed: TOTAL HIP ARTHROPLASTY ANTERIOR APPROACH (Left Hip)     Patient location during evaluation: PACU Anesthesia Type: General Level of consciousness: awake and alert Pain management: pain level controlled Vital Signs Assessment: post-procedure vital signs reviewed and stable Respiratory status: spontaneous breathing, nonlabored ventilation, respiratory function stable and patient connected to nasal cannula oxygen Cardiovascular status: blood pressure returned to baseline and stable Postop Assessment: no apparent nausea or vomiting Anesthetic complications: no    Last Vitals:  Vitals:   12/08/19 1800 12/08/19 1824  BP: (!) 125/49 (!) 126/42  Pulse: 76 78  Resp: 13 16  Temp:  36.5 C  SpO2: 99% 100%    Last Pain:  Vitals:   12/08/19 1824  TempSrc: Oral  PainSc:                  Tiajuana Amass

## 2019-12-08 NOTE — Anesthesia Preprocedure Evaluation (Addendum)
Anesthesia Evaluation  Patient identified by MRN, date of birth, ID band Patient awake    Reviewed: Allergy & Precautions, NPO status , Patient's Chart, lab work & pertinent test results  Airway Mallampati: II  TM Distance: >3 FB Neck ROM: Full    Dental  (+) Dental Advisory Given   Pulmonary COPD, former smoker,    breath sounds clear to auscultation       Cardiovascular hypertension, Pt. on medications and Pt. on home beta blockers  Rhythm:Regular Rate:Normal     Neuro/Psych  Neuromuscular disease    GI/Hepatic Neg liver ROS, GERD  ,  Endo/Other  negative endocrine ROS  Renal/GU negative Renal ROS     Musculoskeletal  (+) Arthritis ,   Abdominal   Peds  Hematology negative hematology ROS (+)   Anesthesia Other Findings   Reproductive/Obstetrics                             Lab Results  Component Value Date   WBC 7.9 12/02/2019   HGB 15.4 (H) 12/02/2019   HCT 47.0 (H) 12/02/2019   MCV 102.0 (H) 12/02/2019   PLT 159 12/02/2019   Lab Results  Component Value Date   CREATININE 0.90 12/02/2019   BUN 29 (H) 12/02/2019   NA 142 12/02/2019   K 4.0 12/02/2019   CL 104 12/02/2019   CO2 28 12/02/2019   Lab Results  Component Value Date   INR 1.0 12/02/2019    Anesthesia Physical Anesthesia Plan  ASA: III  Anesthesia Plan: General   Post-op Pain Management:    Induction: Intravenous  PONV Risk Score and Plan: 3 and Dexamethasone, Ondansetron and Treatment may vary due to age or medical condition  Airway Management Planned: Oral ETT  Additional Equipment: None  Intra-op Plan:   Post-operative Plan: Extubation in OR  Informed Consent: I have reviewed the patients History and Physical, chart, labs and discussed the procedure including the risks, benefits and alternatives for the proposed anesthesia with the patient or authorized representative who has indicated his/her  understanding and acceptance.     Dental advisory given  Plan Discussed with: CRNA  Anesthesia Plan Comments:        Anesthesia Quick Evaluation

## 2019-12-09 ENCOUNTER — Encounter: Payer: Self-pay | Admitting: *Deleted

## 2019-12-09 LAB — CBC
HCT: 38.7 % (ref 36.0–46.0)
Hemoglobin: 12.6 g/dL (ref 12.0–15.0)
MCH: 32.6 pg (ref 26.0–34.0)
MCHC: 32.6 g/dL (ref 30.0–36.0)
MCV: 100.3 fL — ABNORMAL HIGH (ref 80.0–100.0)
Platelets: 127 10*3/uL — ABNORMAL LOW (ref 150–400)
RBC: 3.86 MIL/uL — ABNORMAL LOW (ref 3.87–5.11)
RDW: 12.4 % (ref 11.5–15.5)
WBC: 10.2 10*3/uL (ref 4.0–10.5)
nRBC: 0 % (ref 0.0–0.2)

## 2019-12-09 LAB — BASIC METABOLIC PANEL
Anion gap: 6 (ref 5–15)
BUN: 18 mg/dL (ref 8–23)
CO2: 25 mmol/L (ref 22–32)
Calcium: 8.8 mg/dL — ABNORMAL LOW (ref 8.9–10.3)
Chloride: 103 mmol/L (ref 98–111)
Creatinine, Ser: 0.66 mg/dL (ref 0.44–1.00)
GFR calc Af Amer: >60 mL/min (ref >60)
GFR calc non Af Amer: >60 mL/min (ref >60)
Glucose, Bld: 126 mg/dL — ABNORMAL HIGH (ref 70–99)
Potassium: 4.8 mmol/L (ref 3.5–5.1)
Sodium: 134 mmol/L — ABNORMAL LOW (ref 135–145)

## 2019-12-09 MED ORDER — ASPIRIN 325 MG PO TBEC: 325.0000 mg | DELAYED_RELEASE_TABLET | Freq: Two times a day (BID) | ORAL | 0 refills | Status: AC

## 2019-12-09 MED ORDER — OXYCODONE HCL 5 MG PO TABS: 5.0000 mg | ORAL_TABLET | Freq: Four times a day (QID) | ORAL | 0 refills | Status: DC | PRN

## 2019-12-09 MED ORDER — ACETAMINOPHEN 325 MG PO TABS: 1000.0000 mg | ORAL_TABLET | Freq: Three times a day (TID) | ORAL | Status: DC | PRN

## 2019-12-09 NOTE — Progress Notes (Addendum)
   Subjective: 1 Day Post-Op Procedure(s) (LRB): TOTAL HIP ARTHROPLASTY ANTERIOR APPROACH (Left) Patient reports pain as mild.   Patient seen in rounds with Dr. Wynelle Link. Patient is well, and has had no acute complaints or problems other than discomfort in the left hip. No acute events overnight. She did have difficulty voiding yesterday, and had an in and out catheter because of this. Denies CP, SHOB, N/V.  We will start therapy today.   Objective: Vital signs in last 24 hours: Temp:  [97.3 F (36.3 C)-99.3 F (37.4 C)] 98.7 F (37.1 C) (06/03 0448) Pulse Rate:  [64-87] 84 (06/03 0448) Resp:  [13-24] 16 (06/03 0448) BP: (104-155)/(39-93) 146/93 (06/03 0448) SpO2:  [96 %-100 %] 98 % (06/03 0448) Weight:  [100 kg] 100 kg (06/02 1259)  Intake/Output from previous day:  Intake/Output Summary (Last 24 hours) at 12/09/2019 0722 Last data filed at 12/09/2019 0540 Gross per 24 hour  Intake 2740 ml  Output 1300 ml  Net 1440 ml     Intake/Output this shift: No intake/output data recorded.  Labs: Recent Labs    12/09/19 0243  HGB 12.6   Recent Labs    12/09/19 0243  WBC 10.2  RBC 3.86*  HCT 38.7  PLT 127*   Recent Labs    12/09/19 0243  NA 134*  K 4.8  CL 103  CO2 25  BUN 18  CREATININE 0.66  GLUCOSE 126*  CALCIUM 8.8*   No results for input(s): LABPT, INR in the last 72 hours.  Exam: General - Patient is Alert and Oriented Extremity - Neurologically intact Sensation intact distally Intact pulses distally Dorsiflexion/Plantar flexion intact Dressing - dressing C/D/I Motor Function - intact, moving foot and toes well on exam.   Past Medical History:  Diagnosis Date  . Bell's palsy    right  . COPD (chronic obstructive pulmonary disease) (HCC)    mild  . Decrease in appetite 09/25/2015  . Depression   . Diarrhea 09/25/2015  . Dyspnea   . Hemochromatosis   . High blood pressure   . History of vaginal bleeding 09/05/2015  . LLQ pain 09/25/2015  .  Neuropathy   . Osteoarthritis    knees  . Vaginal atrophy 09/05/2015  . Weight loss 09/25/2015    Assessment/Plan: 1 Day Post-Op Procedure(s) (LRB): TOTAL HIP ARTHROPLASTY ANTERIOR APPROACH (Left) Principal Problem:   OA (osteoarthritis) of hip Active Problems:   Primary osteoarthritis of left hip  Estimated body mass index is 37.26 kg/m as calculated from the following:   Height as of this encounter: 5' 4.5" (1.638 m).   Weight as of this encounter: 100 kg. Advance diet Up with therapy  DVT Prophylaxis - Aspirin Weight bearing as tolerated.  Hemoglobin stable at 12.6 today. Plan is to go Skilled nursing facility after hospital stay at [facility]. Plan for discharge today vs tomorrow pending therapy progression and disposition planning. Plan to work with therapy today.   Nursing relayed that she did complain of eye irritation yesterday, and utilized lubricating eye drops for this. If she continues to have this issue, she will follow up with her PCP or ophthalmologist outpatient.  Griffith Citron, PA-C Orthopedic Surgery [phone] 12/09/2019, 7:22 AM

## 2019-12-09 NOTE — TOC Initial Note (Addendum)
Transition of Care [facility]) - Initial/Assessment Note    Patient Details  Name: Jessica Serrano MRN: [id number] Date of Birth: 1940/11/10  Transition [facility] CM/SW Contact:    Leeroy Cha, RN Phone Number: 12/09/2019, 7:33 AM  Clinical Narrative:                 Hip replcment  Plan: [facility] fl2 sent out via hub at [place]. passar number is [id number] 1348/tct-Pat[facility] at [facility]-has to have a 3 night stay and will need new covid test. Floor ,pa for aluisio and dr Mickel Duhamel notified.. Can come to snf on SAT need dc summary before 1030am. Expected Discharge Plan: [place] Barriers to Discharge: Continued Medical Work up   Patient Goals and CMS Choice Patient states their goals for this hospitalization and ongoing recovery are:: to go to rehab and then home CMS Medicare.gov Compare Post Acute Care list provided to:: Patient Choice offered to / list presented to : Patient  Expected Discharge Plan and Services Expected Discharge Plan: [place]   Discharge Planning Services: CM Consult Post Acute Care Choice: [place] Living arrangements for the past 2 months: Single Family Home                 DME Arranged: N/A DME Agency: NA       HH Arranged: NA HH Agency: NA        Prior Living Arrangements/Services Living arrangements for the past 2 months: [place] Lives with:: Self Patient language and need for interpreter reviewed:: No        Need for Family Participation in Patient Care: Yes (Comment) Care giver support system in place?: Yes (comment)   Criminal Activity/Legal Involvement Pertinent to Current Situation/Hospitalization: No - Comment as needed  Activities of Daily Living Home Assistive Devices/Equipment: Dentures (specify type), Eyeglasses, Walker (specify type), Wheelchair ADL Screening (condition at time of admission) Patient's cognitive  ability adequate to safely complete daily activities?: Yes Is the patient deaf or have difficulty hearing?: No Does the patient have difficulty seeing, even when wearing glasses/contacts?: No Does the patient have difficulty concentrating, remembering, or making decisions?: No Patient able to express need for assistance with ADLs?: Yes Does the patient have difficulty dressing or bathing?: No Independently performs ADLs?: Yes (appropriate for developmental age) Does the patient have difficulty walking or climbing stairs?: Yes Weakness of Legs: Both Weakness of Arms/Hands: Both  Permission Sought/Granted                  Emotional Assessment Appearance:: Appears stated age Attitude/Demeanor/Rapport: Engaged Affect (typically observed): Calm Orientation: : Oriented to Self, Oriented to Place, Oriented to  Time, Oriented to Situation Alcohol / Substance Use: Not Applicable Psych Involvement: No (comment)  Admission diagnosis:  Primary osteoarthritis of left hip [M16.12] Patient Active Problem List   Diagnosis Date Noted  . OA (osteoarthritis) of hip 12/08/2019  . Primary osteoarthritis of left hip 12/08/2019  . Diarrhea 09/25/2015  . Weight loss 09/25/2015  . Decrease in appetite 09/25/2015  . LLQ pain 09/25/2015  . Vaginal discharge 09/05/2015  . Vaginal atrophy 09/05/2015  . History of vaginal bleeding 09/05/2015  . Dysphagia, unspecified(787.20) 12/06/2013  . GERD (gastroesophageal reflux disease) 12/06/2013  . Unspecified hereditary and idiopathic peripheral neuropathy 04/16/2013  . CHEST PAIN 11/27/2007  . HYPERCHOLESTEROLEMIA 11/06/2007  . OBESITY, MORBID 11/06/2007  . HYPERTENSION 11/06/2007  . DYSPNEA 11/06/2007  . ANGINA, HX OF  11/06/2007   PCP:  Caryl Bis, MD Pharmacy:   [facility] [address], [place] [address] Phone: [phone] Fax: [place], [place] [id number] [place] [id number] Phone: [phone] Fax: [phone]     Social Determinants of Health (SDOH) Interventions    Readmission Risk Interventions No flowsheet data found.

## 2019-12-09 NOTE — Progress Notes (Signed)
Physical Therapy Treatment Patient Details Name: Jessica Serrano MRN: [id number] DOB: 11-Feb-1941 Today's Date: 12/09/2019    History of Present Illness Pt s/p L THR and with hx of COPD, L TKR, back sugery, Bells Palsy.    PT Comments    Pt is cooperative and progressing (but slowly) with mobility - pt ltd by pain and fatigues easily 2* premorbid deconditioning.  Follow Up Recommendations  SNF     Equipment Recommendations  None recommended by PT    Recommendations for Other Services OT consult     Precautions / Restrictions Precautions Precautions: Fall Restrictions Weight Bearing Restrictions: No Other Position/Activity Restrictions: WBAT    Mobility  Bed Mobility               General bed mobility comments: Pt up in chair and requests back to same  Transfers Overall transfer level: Needs assistance Equipment used: Rolling walker (2 wheeled) Transfers: Sit to/from Stand Sit to Stand: Mod assist;+2 physical assistance;+2 safety/equipment         General transfer comment: cues for LE management and use of UEs to self assist  Ambulation/Gait Ambulation/Gait assistance: +2 physical assistance;+2 safety/equipment;Min assist Gait Distance (Feet): 18 Feet Assistive device: Rolling walker (2 wheeled) Gait Pattern/deviations: Step-to pattern;Decreased step length - right;Decreased step length - left;Shuffle;Trunk flexed Gait velocity: decr   General Gait Details: cues for sequence, posture and position from RW; distance ltd by fatigue   Stairs             Wheelchair Mobility    Modified Rankin (Stroke Patients Only)       Balance Overall balance assessment: Needs assistance Sitting-balance support: No upper extremity supported;Feet supported Sitting balance-Leahy Scale: Good     Standing balance support: Bilateral upper extremity supported Standing balance-Leahy Scale: Poor                              Cognition  Arousal/Alertness: Awake/alert Behavior During Therapy: WFL for tasks assessed/performed Overall Cognitive Status: Within Functional Limits for tasks assessed                                        Exercises      General Comments        Pertinent Vitals/Pain Pain Assessment: 0-10 Pain Score: 7  Pain Location: L hip Pain Descriptors / Indicators: Aching;Moaning;Guarding;Grimacing Pain Intervention(s): Limited activity within patient's tolerance;Monitored during session;Premedicated before session;Ice applied    Home Living                      Prior Function            PT Goals (current goals can now be found in the care plan section) Acute Rehab PT Goals Patient Stated Goal: Regain IND PT Goal Formulation: With patient Time For Goal Achievement: 12/23/19 Potential to Achieve Goals: Good Progress towards PT goals: Progressing toward goals    Frequency    7X/week      PT Plan Current plan remains appropriate    Co-evaluation              AM-PAC PT "6 Clicks" Mobility   Outcome Measure  Help needed turning from your back to your side while in a flat bed without using bedrails?: A Lot Help needed moving from lying on your back to sitting on  the side of a flat bed without using bedrails?: A Lot Help needed moving to and from a bed to a chair (including a wheelchair)?: A Lot Help needed standing up from a chair using your arms (e.g., wheelchair or bedside chair)?: A Lot Help needed to walk in hospital room?: A Little Help needed climbing 3-5 steps with a railing? : Total 6 Click Score: 12    End of Session Equipment Utilized During Treatment: Gait belt Activity Tolerance: Patient limited by fatigue Patient left: in chair;with call bell/phone within reach;with chair alarm set;with family/visitor present Nurse Communication: Mobility status PT Visit Diagnosis: Difficulty in walking, not elsewhere classified (R26.2)     Time:  [id number] PT Time Calculation (min) (ACUTE ONLY): 21 min  Charges:  $Gait Training: 8-22 mins                     [place] Pager [phone] Office [phone]    Calynn Ferrero 12/09/2019, 3:15 PM

## 2019-12-09 NOTE — NC FL2 (Signed)
[place] MEDICAID FL2 LEVEL OF CARE SCREENING TOOL     IDENTIFICATION  Patient Name: Jessica Serrano Birthdate: [birth year] Sex: female Admission Date (Current Location): 12/08/2019  [facility] and [place] Number:  Herbalist and Address:  [facility],  [place] [address] [place]      Provider Number: [id number]  Attending Physician Name and Address:  Gaynelle Arabian, MD  Relative Name and Phone Number:       Current Level of Care: Hospital Recommended Level of Care: [place] Prior Approval Number:    Date Approved/Denied:   PASRR Number: [id number]  Discharge Plan: SNF    Current Diagnoses: Patient Active Problem List   Diagnosis Date Noted  . OA (osteoarthritis) of hip 12/08/2019  . Primary osteoarthritis of left hip 12/08/2019  . Diarrhea 09/25/2015  . Weight loss 09/25/2015  . Decrease in appetite 09/25/2015  . LLQ pain 09/25/2015  . Vaginal discharge 09/05/2015  . Vaginal atrophy 09/05/2015  . History of vaginal bleeding 09/05/2015  . Dysphagia, unspecified(787.20) 12/06/2013  . GERD (gastroesophageal reflux disease) 12/06/2013  . Unspecified hereditary and idiopathic peripheral neuropathy 04/16/2013  . CHEST PAIN 11/27/2007  . HYPERCHOLESTEROLEMIA 11/06/2007  . OBESITY, MORBID 11/06/2007  . HYPERTENSION 11/06/2007  . DYSPNEA 11/06/2007  . ANGINA, HX OF 11/06/2007    Orientation RESPIRATION BLADDER Height & Weight     Self, Time, Situation, Place  Normal Continent Weight: 100 kg Height:  5' 4.5" (163.8 cm)  BEHAVIORAL SYMPTOMS/MOOD NEUROLOGICAL BOWEL NUTRITION STATUS      Continent Diet(regular)  AMBULATORY STATUS COMMUNICATION OF NEEDS Skin   Extensive Assist Verbally Normal                       Personal Care Assistance Level of Assistance  Bathing, Feeding, Dressing Bathing Assistance: Limited assistance Feeding assistance: Limited assistance Dressing Assistance: Limited assistance      Functional Limitations Info  Sight, Hearing, Speech Sight Info: Adequate Hearing Info: Adequate Speech Info: Adequate    SPECIAL CARE FACTORS FREQUENCY  PT (By licensed PT)     PT Frequency: 5 x weekly              Contractures Contractures Info: Not present    Additional Factors Info  Code Status Code Status Info: full             Current Medications (12/09/2019):  This is the current hospital active medication list Current Facility-Administered Medications  Medication Dose Route Frequency Provider Last Rate Last Admin  . 0.9 %  sodium chloride infusion   Intravenous Continuous Edmisten, Kristie L, PA      . acetaminophen (TYLENOL) tablet 325-650 mg  325-650 mg Oral Q6H PRN Edmisten, Kristie L, PA      . aspirin EC tablet 325 mg  325 mg Oral BID Edmisten, Kristie L, PA      . atenolol (TENORMIN) tablet 50 mg  50 mg Oral Daily Edmisten, Kristie L, PA      . atorvastatin (LIPITOR) tablet 20 mg  20 mg Oral Daily Edmisten, Kristie L, PA      . bisacodyl (DULCOLAX) suppository 10 mg  10 mg Rectal Daily PRN Edmisten, Kristie L, PA      . dexamethasone (DECADRON) injection 10 mg  10 mg Intravenous Once Edmisten, Kristie L, PA      . docusate sodium (COLACE) capsule 100 mg  100 mg Oral BID Edmisten, Kristie L, PA   100 mg at  12/08/19 2146  . furosemide (LASIX) tablet 40 mg  40 mg Oral BID Edmisten, Kristie L, PA      . gabapentin (NEURONTIN) capsule 600 mg  600 mg Oral Daily Aluisio, Pilar Plate, MD       And  . gabapentin (NEURONTIN) capsule 1,200 mg  1,200 mg Oral QHS Gaynelle Arabian, MD   1,200 mg at 12/08/19 2145  . losartan (COZAAR) tablet 25 mg  25 mg Oral Daily Edmisten, Kristie L, PA      . magnesium citrate solution 1 Bottle  1 Bottle Oral Once PRN Edmisten, Kristie L, PA      . menthol-cetylpyridinium (CEPACOL) lozenge 3 mg  1 lozenge Oral PRN Edmisten, Kristie L, PA       Or  . phenol (CHLORASEPTIC) mouth spray 1 spray  1 spray Mouth/Throat PRN Edmisten, Kristie L, PA       . metoCLOPramide (REGLAN) tablet 5-10 mg  5-10 mg Oral Q8H PRN Edmisten, Kristie L, PA       Or  . metoCLOPramide (REGLAN) injection 5-10 mg  5-10 mg Intravenous Q8H PRN Edmisten, Kristie L, PA      . morphine 2 MG/ML injection 0.5-1 mg  0.5-1 mg Intravenous Q2H PRN Edmisten, Kristie L, PA      . ondansetron (ZOFRAN) tablet 4 mg  4 mg Oral Q6H PRN Edmisten, Kristie L, PA       Or  . ondansetron (ZOFRAN) injection 4 mg  4 mg Intravenous Q6H PRN Edmisten, Kristie L, PA      . oxyCODONE (Oxy IR/ROXICODONE) immediate release tablet 10-15 mg  10-15 mg Oral Q4H PRN Edmisten, Kristie L, PA   10 mg at 12/09/19 0435  . oxyCODONE (Oxy IR/ROXICODONE) immediate release tablet 5-10 mg  5-10 mg Oral Q4H PRN Edmisten, Kristie L, PA   5 mg at 12/08/19 2018  . polyethylene glycol (MIRALAX / GLYCOLAX) packet 17 g  17 g Oral Daily PRN Edmisten, Kristie L, PA      . polyvinyl alcohol (LIQUIFILM TEARS) 1.4 % ophthalmic solution 1 drop  1 drop Both Eyes PRN Ward, Yvonne Kendall, PA-C   1 drop at 12/08/19 2143  . rOPINIRole (REQUIP) tablet 1 mg  1 mg Oral BID Edmisten, Kristie L, PA   1 mg at 12/08/19 2146  . temazepam (RESTORIL) capsule 30 mg  30 mg Oral QHS Edmisten, Kristie L, PA   30 mg at 12/08/19 2146  . venlafaxine (EFFEXOR) tablet 75 mg  75 mg Oral BID Edmisten, Kristie L, PA   75 mg at 12/08/19 2146     Discharge Medications: Please see discharge summary for a list of discharge medications.  Relevant Imaging Results:  Relevant Lab Results:   Additional Information [id number]  Leeroy Cha, RN

## 2019-12-09 NOTE — Evaluation (Signed)
Physical Therapy Evaluation Patient Details Name: Jessica Serrano MRN: [id number] DOB: 05-21-1941 Today's Date: 12/09/2019   History of Present Illness  Pt s/p L THR and with hx of COPD, L TKR, back sugery, Bells Palsy.  Clinical Impression  Pt s/p L THR and presents with decreased L LE strength/ROM, post op pain, obesity, and premorbid deconditioning limiting functional mobility.  Pt would benefit from follow up rehab at SNF level to maximize IND and safety prior to return home with limited assist.    Follow Up Recommendations SNF    Equipment Recommendations  None recommended by PT    Recommendations for Other Services OT consult     Precautions / Restrictions Precautions Precautions: Fall Restrictions Weight Bearing Restrictions: No Other Position/Activity Restrictions: WBAT      Mobility  Bed Mobility Overal bed mobility: Needs Assistance Bed Mobility: Supine to Sit     Supine to sit: Max assist;+2 for physical assistance;+2 for safety/equipment;HOB elevated     General bed mobility comments: INcreased time with cues for sequence and use of L LE to self assist;  Physical assist to manage L LE, control trunk and complete transition with use of pad  Transfers Overall transfer level: Needs assistance Equipment used: Rolling walker (2 wheeled) Transfers: Sit to/from Stand Sit to Stand: Mod assist;+2 physical assistance;+2 safety/equipment;From elevated surface         General transfer comment: cues for LE management and use of UEs to self assist  Ambulation/Gait Ambulation/Gait assistance: Min assist;Mod assist;+2 physical assistance;+2 safety/equipment Gait Distance (Feet): 5 Feet Assistive device: Rolling walker (2 wheeled) Gait Pattern/deviations: Step-to pattern;Decreased step length - right;Decreased step length - left;Shuffle;Trunk flexed Gait velocity: decr   General Gait Details: cues for sequence, posture and position from RW; distance ltd by  fatigue  Stairs            Wheelchair Mobility    Modified Rankin (Stroke Patients Only)       Balance                                             Pertinent Vitals/Pain Pain Assessment: 0-10 Pain Score: 4  Pain Location: L hip Pain Descriptors / Indicators: Aching;Moaning;Guarding;Grimacing Pain Intervention(s): Limited activity within patient's tolerance;Monitored during session;Premedicated before session;Ice applied    Home Living Family/patient expects to be discharged to:: Skilled nursing facility Living Arrangements: Alone                    Prior Function Level of Independence: Needs assistance   Gait / Transfers Assistance Needed: Very ltd ambulation with RW  ADL's / Homemaking Assistance Needed: Assist of family        Hand Dominance        Extremity/Trunk Assessment   Upper Extremity Assessment Upper Extremity Assessment: Generalized weakness    Lower Extremity Assessment Lower Extremity Assessment: Generalized weakness;RLE deficits/detail;LLE deficits/detail LLE Deficits / Details: 2/5 strength at hip with AAROM at hip to 45 flex and 15 abd    Cervical / Trunk Assessment Cervical / Trunk Assessment: Kyphotic  Communication   Communication: HOH  Cognition Arousal/Alertness: Awake/alert Behavior During Therapy: WFL for tasks assessed/performed Overall Cognitive Status: Within Functional Limits for tasks assessed  General Comments      Exercises Total Joint Exercises Ankle Circles/Pumps: AROM;Both;15 reps;Supine Quad Sets: AROM;Both;10 reps;Supine Heel Slides: AAROM;15 reps;Supine;Left Hip ABduction/ADduction: AAROM;Left;15 reps;Supine   Assessment/Plan    PT Assessment Patient needs continued PT services  PT Problem List Decreased strength;Decreased range of motion;Decreased activity tolerance;Decreased balance;Decreased mobility;Decreased knowledge  of use of DME;Obesity;Pain       PT Treatment Interventions DME instruction;Gait training;Stair training;Functional mobility training;Therapeutic activities;Therapeutic exercise;Balance training;Patient/family education    PT Goals (Current goals can be found in the Care Plan section)  Acute Rehab PT Goals Patient Stated Goal: Regain IND PT Goal Formulation: With patient Time For Goal Achievement: 12/23/19 Potential to Achieve Goals: Good    Frequency 7X/week   Barriers to discharge Decreased caregiver support Home alone    Co-evaluation               AM-PAC PT "6 Clicks" Mobility  Outcome Measure Help needed turning from your back to your side while in a flat bed without using bedrails?: A Lot Help needed moving from lying on your back to sitting on the side of a flat bed without using bedrails?: A Lot Help needed moving to and from a bed to a chair (including a wheelchair)?: A Lot Help needed standing up from a chair using your arms (e.g., wheelchair or bedside chair)?: A Lot Help needed to walk in hospital room?: A Lot Help needed climbing 3-5 steps with a railing? : Total 6 Click Score: 11    End of Session Equipment Utilized During Treatment: Gait belt Activity Tolerance: Patient limited by fatigue Patient left: in chair;with call bell/phone within reach;with chair alarm set;with nursing/sitter in room Nurse Communication: Mobility status PT Visit Diagnosis: Difficulty in walking, not elsewhere classified (R26.2)    Time: [id number] PT Time Calculation (min) (ACUTE ONLY): 31 min   Charges:   PT Evaluation $PT Eval Low Complexity: 1 Low PT Treatments $Therapeutic Exercise: 8-22 mins        [place] Pager [phone] Office [phone]   Naim Murtha 12/09/2019, 1:36 PM

## 2019-12-10 LAB — CBC
HCT: 33.1 % — ABNORMAL LOW (ref 36.0–46.0)
Hemoglobin: 10.9 g/dL — ABNORMAL LOW (ref 12.0–15.0)
MCH: 32.9 pg (ref 26.0–34.0)
MCHC: 32.9 g/dL (ref 30.0–36.0)
MCV: 100 fL (ref 80.0–100.0)
Platelets: 147 10*3/uL — ABNORMAL LOW (ref 150–400)
RBC: 3.31 MIL/uL — ABNORMAL LOW (ref 3.87–5.11)
RDW: 12.6 % (ref 11.5–15.5)
WBC: 8.3 10*3/uL (ref 4.0–10.5)
nRBC: 0 % (ref 0.0–0.2)

## 2019-12-10 LAB — BASIC METABOLIC PANEL
Anion gap: 8 (ref 5–15)
BUN: 19 mg/dL (ref 8–23)
CO2: 28 mmol/L (ref 22–32)
Calcium: 8.7 mg/dL — ABNORMAL LOW (ref 8.9–10.3)
Chloride: 101 mmol/L (ref 98–111)
Creatinine, Ser: 0.66 mg/dL (ref 0.44–1.00)
GFR calc Af Amer: >60 mL/min (ref >60)
GFR calc non Af Amer: >60 mL/min (ref >60)
Glucose, Bld: 106 mg/dL — ABNORMAL HIGH (ref 70–99)
Potassium: 4 mmol/L (ref 3.5–5.1)
Sodium: 137 mmol/L (ref 135–145)

## 2019-12-10 NOTE — Care Management Important Message (Signed)
Important Message  Patient Details  Name: Jessica Serrano MRN: [id number] Date of Birth: July 16, 1940   Medicare Important Message Given:      Document given to Velva Harman, NCM  to give to the patient.   Crista Luria 12/10/2019, 10:34 AM

## 2019-12-10 NOTE — Plan of Care (Signed)
  Problem: Education: Goal: Knowledge of General Education information will improve Description: Including pain rating scale, medication(s)/side effects and non-pharmacologic comfort measures Outcome: Progressing   Problem: Health Behavior/Discharge Planning: Goal: Ability to manage health-related needs will improve Outcome: Progressing   Problem: Clinical Measurements: Goal: Ability to maintain clinical measurements within normal limits will improve Outcome: Progressing Goal: Will remain free from infection Outcome: Progressing Goal: Diagnostic test results will improve Outcome: Progressing Goal: Respiratory complications will improve Outcome: Progressing   Problem: Activity: Goal: Risk for activity intolerance will decrease Outcome: Progressing   Problem: Elimination: Goal: Will not experience complications related to bowel motility Outcome: Progressing   Problem: Pain Managment: Goal: General experience of comfort will improve Outcome: Progressing   Problem: Safety: Goal: Ability to remain free from injury will improve Outcome: Progressing   Problem: Skin Integrity: Goal: Risk for impaired skin integrity will decrease Outcome: Progressing   Problem: Education: Goal: Knowledge of the prescribed therapeutic regimen will improve Outcome: Progressing Goal: Understanding of discharge needs will improve Outcome: Progressing   Problem: Activity: Goal: Ability to avoid complications of mobility impairment will improve Outcome: Progressing Goal: Ability to tolerate increased activity will improve Outcome: Progressing   Problem: Clinical Measurements: Goal: Postoperative complications will be avoided or minimized Outcome: Progressing   Problem: Pain Management: Goal: Pain level will decrease with appropriate interventions Outcome: Progressing   Problem: Skin Integrity: Goal: Will show signs of wound healing Outcome: Progressing

## 2019-12-10 NOTE — Progress Notes (Signed)
Physical Therapy Treatment Patient Details Name: Jessica Serrano MRN: [id number] DOB: January 12, 1941 Today's Date: 12/10/2019    History of Present Illness Pt s/p L THR and with hx of COPD, L TKR, back sugery, Bells Palsy.    PT Comments    Pt very cooperative and progressing with mobility but continues with ltd endurance.  Pt ambulated 36' this am but requesting rest break prior to therex - will return for same.   Follow Up Recommendations  SNF     Equipment Recommendations  None recommended by PT    Recommendations for Other Services OT consult     Precautions / Restrictions Precautions Precautions: Fall Restrictions Weight Bearing Restrictions: No Other Position/Activity Restrictions: WBAT    Mobility  Bed Mobility               General bed mobility comments: Pt up in chair and requests back to same  Transfers Overall transfer level: Needs assistance Equipment used: Rolling walker (2 wheeled) Transfers: Sit to/from Stand Sit to Stand: +2 physical assistance;+2 safety/equipment;Min assist;Mod assist         General transfer comment: cues for LE management and use of UEs to self assist  Ambulation/Gait Ambulation/Gait assistance: +2 safety/equipment;Min assist Gait Distance (Feet): 36 Feet Assistive device: Rolling walker (2 wheeled) Gait Pattern/deviations: Step-to pattern;Decreased step length - right;Decreased step length - left;Shuffle;Trunk flexed Gait velocity: decr   General Gait Details: cues for sequence, posture and position from RW; distance ltd by fatigue   Stairs             Wheelchair Mobility    Modified Rankin (Stroke Patients Only)       Balance Overall balance assessment: Needs assistance Sitting-balance support: No upper extremity supported;Feet supported Sitting balance-Leahy Scale: Good     Standing balance support: Bilateral upper extremity supported Standing balance-Leahy Scale: Poor                               Cognition Arousal/Alertness: Awake/alert Behavior During Therapy: WFL for tasks assessed/performed Overall Cognitive Status: Within Functional Limits for tasks assessed                                        Exercises      General Comments        Pertinent Vitals/Pain Pain Assessment: 0-10 Pain Score: 5  Pain Location: L hip Pain Descriptors / Indicators: Aching;Moaning;Guarding;Grimacing Pain Intervention(s): Limited activity within patient's tolerance;Monitored during session;Premedicated before session;Ice applied    Home Living                      Prior Function            PT Goals (current goals can now be found in the care plan section) Acute Rehab PT Goals Patient Stated Goal: Regain IND PT Goal Formulation: With patient Time For Goal Achievement: 12/23/19 Potential to Achieve Goals: Good Progress towards PT goals: Progressing toward goals    Frequency    7X/week      PT Plan Current plan remains appropriate    Co-evaluation              AM-PAC PT "6 Clicks" Mobility   Outcome Measure  Help needed turning from your back to your side while in a flat bed without using bedrails?: A Lot Help  needed moving from lying on your back to sitting on the side of a flat bed without using bedrails?: A Lot Help needed moving to and from a bed to a chair (including a wheelchair)?: A Lot Help needed standing up from a chair using your arms (e.g., wheelchair or bedside chair)?: A Lot Help needed to walk in hospital room?: A Little Help needed climbing 3-5 steps with a railing? : Total 6 Click Score: 12    End of Session Equipment Utilized During Treatment: Gait belt Activity Tolerance: Patient limited by fatigue Patient left: in chair;with call bell/phone within reach;with chair alarm set Nurse Communication: Mobility status PT Visit Diagnosis: Difficulty in walking, not elsewhere classified (R26.2)     Time:  2353-6144 PT Time Calculation (min) (ACUTE ONLY): 22 min  Charges:  $Gait Training: 8-22 mins                     [place] Pager [phone] Office [phone]    Juhi Lagrange 12/10/2019, 12:54 PM

## 2019-12-10 NOTE — Progress Notes (Signed)
Physical Therapy Treatment Patient Details Name: Jessica Serrano MRN: [id number] DOB: [birth year] Today's Date: 12/10/2019    History of Present Illness Pt s/p L THR and with hx of COPD, L TKR, back sugery, Bells Palsy.    PT Comments    Pt continues cooperate and progressing with mobility but with increased time required for all tasks.   Follow Up Recommendations  SNF     Equipment Recommendations  None recommended by PT    Recommendations for Other Services OT consult     Precautions / Restrictions Precautions Precautions: Fall Restrictions Weight Bearing Restrictions: No Other Position/Activity Restrictions: WBAT    Mobility  Bed Mobility               General bed mobility comments: Pt up in chair and requests back to same  Transfers Overall transfer level: Needs assistance Equipment used: Rolling walker (2 wheeled) Transfers: Sit to/from Stand Sit to Stand: +2 physical assistance;+2 safety/equipment;Min assist;Mod assist         General transfer comment: cues for LE management and use of UEs to self assist  Ambulation/Gait Ambulation/Gait assistance: +2 safety/equipment;Min assist Gait Distance (Feet): 36 Feet Assistive device: Rolling walker (2 wheeled) Gait Pattern/deviations: Step-to pattern;Decreased step length - right;Decreased step length - left;Shuffle;Trunk flexed Gait velocity: decr   General Gait Details: cues for sequence, posture and position from RW; distance ltd by fatigue   Stairs             Wheelchair Mobility    Modified Rankin (Stroke Patients Only)       Balance Overall balance assessment: Needs assistance Sitting-balance support: No upper extremity supported;Feet supported Sitting balance-Leahy Scale: Good     Standing balance support: Bilateral upper extremity supported Standing balance-Leahy Scale: Poor                              Cognition Arousal/Alertness: Awake/alert Behavior During  Therapy: WFL for tasks assessed/performed Overall Cognitive Status: Within Functional Limits for tasks assessed                                        Exercises Total Joint Exercises Ankle Circles/Pumps: AROM;Both;15 reps;Supine Quad Sets: AROM;Both;10 reps;Supine Heel Slides: AAROM;Supine;Left;20 reps Hip ABduction/ADduction: AAROM;Left;Supine;20 reps    General Comments        Pertinent Vitals/Pain Pain Assessment: 0-10 Pain Score: 5  Pain Location: L hip Pain Descriptors / Indicators: Aching;Moaning;Guarding;Grimacing Pain Intervention(s): Limited activity within patient's tolerance;Monitored during session;Premedicated before session;Ice applied    Home Living                      Prior Function            PT Goals (current goals can now be found in the care plan section) Acute Rehab PT Goals Patient Stated Goal: Regain IND PT Goal Formulation: With patient Time For Goal Achievement: 12/23/19 Potential to Achieve Goals: Good Progress towards PT goals: Progressing toward goals    Frequency    7X/week      PT Plan Current plan remains appropriate    Co-evaluation              AM-PAC PT "6 Clicks" Mobility   Outcome Measure  Help needed turning from your back to your side while in a flat bed without using bedrails?: A  Lot Help needed moving from lying on your back to sitting on the side of a flat bed without using bedrails?: A Lot Help needed moving to and from a bed to a chair (including a wheelchair)?: A Lot Help needed standing up from a chair using your arms (e.g., wheelchair or bedside chair)?: A Little Help needed to walk in hospital room?: A Little Help needed climbing 3-5 steps with a railing? : Total 6 Click Score: 13    End of Session Equipment Utilized During Treatment: Gait belt Activity Tolerance: Patient tolerated treatment well Patient left: in chair;with call bell/phone within reach;with chair alarm  set;with family/visitor present Nurse Communication: Mobility status PT Visit Diagnosis: Difficulty in walking, not elsewhere classified (R26.2)     Time: 8676-1950 PT Time Calculation (min) (ACUTE ONLY): 25 min  Charges:  $Gait Training: 23-37 mins $Therapeutic Exercise: 8-22 mins                     [place] Pager [phone] Office [phone]    Shrey Boike 12/10/2019, 2:50 PM

## 2019-12-10 NOTE — Progress Notes (Signed)
Physical Therapy Treatment Patient Details Name: Jessica Serrano MRN: [id number] DOB: January 26, 1941 Today's Date: 12/10/2019    History of Present Illness Pt s/p L THR and with hx of COPD, L TKR, back sugery, Bells Palsy.    PT Comments    Pt performed therex program with assist.   Follow Up Recommendations  SNF     Equipment Recommendations  None recommended by PT    Recommendations for Other Services OT consult     Precautions / Restrictions Precautions Precautions: Fall Restrictions Weight Bearing Restrictions: No Other Position/Activity Restrictions: WBAT    Mobility  Bed Mobility               General bed mobility comments: Pt up in chair and requests back to same  Transfers Overall transfer level: Needs assistance Equipment used: Rolling walker (2 wheeled) Transfers: Sit to/from Stand Sit to Stand: +2 physical assistance;+2 safety/equipment;Min assist;Mod assist         General transfer comment: cues for LE management and use of UEs to self assist  Ambulation/Gait Ambulation/Gait assistance: +2 safety/equipment;Min assist Gait Distance (Feet): 36 Feet Assistive device: Rolling walker (2 wheeled) Gait Pattern/deviations: Step-to pattern;Decreased step length - right;Decreased step length - left;Shuffle;Trunk flexed Gait velocity: decr   General Gait Details: cues for sequence, posture and position from RW; distance ltd by fatigue   Stairs             Wheelchair Mobility    Modified Rankin (Stroke Patients Only)       Balance Overall balance assessment: Needs assistance Sitting-balance support: No upper extremity supported;Feet supported Sitting balance-Leahy Scale: Good     Standing balance support: Bilateral upper extremity supported Standing balance-Leahy Scale: Poor                              Cognition Arousal/Alertness: Awake/alert Behavior During Therapy: WFL for tasks assessed/performed Overall Cognitive  Status: Within Functional Limits for tasks assessed                                        Exercises Total Joint Exercises Ankle Circles/Pumps: AROM;Both;15 reps;Supine Quad Sets: AROM;Both;10 reps;Supine Heel Slides: AAROM;Supine;Left;20 reps Hip ABduction/ADduction: AAROM;Left;Supine;20 reps    General Comments        Pertinent Vitals/Pain Pain Assessment: 0-10 Pain Score: 5  Pain Location: L hip Pain Descriptors / Indicators: Aching;Moaning;Guarding;Grimacing Pain Intervention(s): Limited activity within patient's tolerance;Monitored during session;Premedicated before session;Ice applied    Home Living                      Prior Function            PT Goals (current goals can now be found in the care plan section) Acute Rehab PT Goals Patient Stated Goal: Regain IND PT Goal Formulation: With patient Time For Goal Achievement: 12/23/19 Potential to Achieve Goals: Good Progress towards PT goals: Progressing toward goals    Frequency    7X/week      PT Plan Current plan remains appropriate    Co-evaluation              AM-PAC PT "6 Clicks" Mobility   Outcome Measure  Help needed turning from your back to your side while in a flat bed without using bedrails?: A Lot Help needed moving from lying on your back  to sitting on the side of a flat bed without using bedrails?: A Lot Help needed moving to and from a bed to a chair (including a wheelchair)?: A Lot Help needed standing up from a chair using your arms (e.g., wheelchair or bedside chair)?: A Lot Help needed to walk in hospital room?: A Little Help needed climbing 3-5 steps with a railing? : Total 6 Click Score: 12    End of Session Equipment Utilized During Treatment: Gait belt Activity Tolerance: Patient tolerated treatment well Patient left: in chair;with call bell/phone within reach;with chair alarm set Nurse Communication: Mobility status PT Visit Diagnosis:  Difficulty in walking, not elsewhere classified (R26.2)     Time: 1100-3496 PT Time Calculation (min) (ACUTE ONLY): 19 min  Charges:  $Gait Training: 8-22 mins $Therapeutic Exercise: 8-22 mins                     [place] Pager [phone] Office [phone]    Alistar Mcenery 12/10/2019, 12:57 PM

## 2019-12-10 NOTE — Progress Notes (Signed)
   Subjective: 2 Days Post-Op Procedure(s) (LRB): TOTAL HIP ARTHROPLASTY ANTERIOR APPROACH (Left) Patient reports pain as moderate.   Pre-op pain is gone Plan is to go Skilled nursing facility after hospital stay.  Objective: Vital signs in last 24 hours: Temp:  [97.7 F (36.5 C)-98.9 F (37.2 C)] 98.9 F (37.2 C) (06/04 0615) Pulse Rate:  [68-69] 68 (06/04 0615) Resp:  [15-16] 16 (06/04 0615) BP: (104-156)/(46-60) 142/46 (06/04 0615) SpO2:  [93 %-97 %] 96 % (06/04 0615)  Intake/Output from previous day:  Intake/Output Summary (Last 24 hours) at 12/10/2019 0712 Last data filed at 12/10/2019 0615 Gross per 24 hour  Intake 720 ml  Output 2100 ml  Net -1380 ml    Intake/Output this shift: No intake/output data recorded.  Labs: Recent Labs    12/09/19 0243 12/10/19 0306  HGB 12.6 10.9*   Recent Labs    12/09/19 0243 12/10/19 0306  WBC 10.2 8.3  RBC 3.86* 3.31*  HCT 38.7 33.1*  PLT 127* 147*   Recent Labs    12/09/19 0243 12/10/19 0306  NA 134* 137  K 4.8 4.0  CL 103 101  CO2 25 28  BUN 18 19  CREATININE 0.66 0.66  GLUCOSE 126* 106*  CALCIUM 8.8* 8.7*   No results for input(s): LABPT, INR in the last 72 hours.  EXAM General - Patient is Alert, Appropriate and Oriented Extremity - Neurologically intact Neurovascular intact No cellulitis present Compartment soft Dressing/Incision - clean, dry, no drainage Motor Function - intact, moving foot and toes well on exam.   Past Medical History:  Diagnosis Date  . Bell's palsy    right  . COPD (chronic obstructive pulmonary disease) (HCC)    mild  . Decrease in appetite 09/25/2015  . Depression   . Diarrhea 09/25/2015  . Dyspnea   . Hemochromatosis   . High blood pressure   . History of vaginal bleeding 09/05/2015  . LLQ pain 09/25/2015  . Neuropathy   . Osteoarthritis    knees  . Vaginal atrophy 09/05/2015  . Weight loss 09/25/2015    Assessment/Plan: 2 Days Post-Op Procedure(s) (LRB): TOTAL HIP  ARTHROPLASTY ANTERIOR APPROACH (Left) Principal Problem:   OA (osteoarthritis) of hip Active Problems:   Primary osteoarthritis of left hip   Up with therapy D/C IV fluids Plan for discharge tomorrow to SNF  DVT Prophylaxis - Aspirin Weight Bearing As Tolerated left Leg  Gaynelle Arabian 12/10/2019, 7:12 AM

## 2019-12-10 NOTE — TOC Progression Note (Signed)
Transition of Care [facility]) - Progression Note    Patient Details  Name: Jessica Serrano MRN: [id number] Date of Birth: [birth year]  Transition of [facility]) CM/SW Contact  Leeroy Cha, RN Phone Number: 12/10/2019, 2:01 PM  Clinical Narrative:    [facility] at [facility] call report to [phone] in the am.  Please call before 10am.   Expected Discharge Plan: [place] Barriers to Discharge: Continued Medical Work up  Expected Discharge Plan and Services Expected Discharge Plan: [place]   Discharge Planning Services: CM Consult Post Acute Care Choice: [place] Living arrangements for the past 2 months: Single Family Home Expected Discharge Date: 12/09/19               DME Arranged: N/A DME Agency: NA       HH Arranged: NA HH Agency: NA         Social Determinants of Health (SDOH) Interventions    Readmission Risk Interventions No flowsheet data found.

## 2019-12-10 NOTE — Plan of Care (Signed)
Plan of care reviewed and discussed with the patient. 

## 2019-12-11 LAB — CBC
HCT: 30.4 % — ABNORMAL LOW (ref 36.0–46.0)
Hemoglobin: 10 g/dL — ABNORMAL LOW (ref 12.0–15.0)
MCH: 33 pg (ref 26.0–34.0)
MCHC: 32.9 g/dL (ref 30.0–36.0)
MCV: 100.3 fL — ABNORMAL HIGH (ref 80.0–100.0)
Platelets: 143 10*3/uL — ABNORMAL LOW (ref 150–400)
RBC: 3.03 MIL/uL — ABNORMAL LOW (ref 3.87–5.11)
RDW: 12.6 % (ref 11.5–15.5)
WBC: 7.1 10*3/uL (ref 4.0–10.5)
nRBC: 0 % (ref 0.0–0.2)

## 2019-12-11 NOTE — Discharge Summary (Signed)
Physician Discharge Summary   Patient ID: CHYREL TAHA MRN: [id number] DOB/AGE: July 23, 1940 79 y.o.  Admit date: 12/08/2019 Discharge date:   Primary Diagnosis: Osteoarthritis of the Left  hip  Admission Diagnoses:  Past Medical History:  Diagnosis Date  . Bell's palsy    right  . COPD (chronic obstructive pulmonary disease) (HCC)    mild  . Decrease in appetite 09/25/2015  . Depression   . Diarrhea 09/25/2015  . Dyspnea   . Hemochromatosis   . High blood pressure   . History of vaginal bleeding 09/05/2015  . LLQ pain 09/25/2015  . Neuropathy   . Osteoarthritis    knees  . Vaginal atrophy 09/05/2015  . Weight loss 09/25/2015   Discharge Diagnoses:   Principal Problem:   OA (osteoarthritis) of hip Active Problems:   Primary osteoarthritis of left hip  Estimated body mass index is 37.26 kg/m as calculated from the following:   Height as of this encounter: 5' 4.5" (1.638 m).   Weight as of this encounter: 100 kg.  Procedure:  Procedure(s) (LRB): TOTAL HIP ARTHROPLASTY ANTERIOR APPROACH (Left)   Consults: None  HPI: Jessica Serrano is a 79 y.o. female who has advanced end-  stage arthritis of their Left  hip with progressively worsening pain and  dysfunction.The patient has failed nonoperative management and presents for  total hip arthroplasty.   Laboratory Data: Admission on 12/08/2019  Component Date Value Ref Range Status  . SARS Coronavirus 2 12/08/2019 NEGATIVE  NEGATIVE Final   Comment: (NOTE) SARS-CoV-2 target nucleic acids are NOT DETECTED. The SARS-CoV-2 RNA is generally detectable in upper and lower respiratory specimens during the acute phase of infection. The lowest concentration of SARS-CoV-2 viral copies this assay can detect is 250 copies / mL. A negative result does not preclude SARS-CoV-2 infection and should not be used as the sole basis for treatment or other patient management decisions.  A negative result may occur with improper  specimen collection / handling, submission of specimen other than nasopharyngeal swab, presence of viral mutation(s) within the areas targeted by this assay, and inadequate number of viral copies (<250 copies / mL). A negative result must be combined with clinical observations, patient history, and epidemiological information. Fact Sheet for Patients:   StrictlyIdeas.no Fact Sheet for Healthcare Providers: BankingDealers.co.za This test is not yet approved or cleared                           by the [place] FDA and has been authorized for detection and/or diagnosis of SARS-CoV-2 by FDA under an Emergency Use Authorization (EUA).  This EUA will remain in effect (meaning this test can be used) for the duration of the COVID-19 declaration under Section 564(b)(1) of the Act, 21 U.S.C. section 360bbb-3(b)(1), unless the authorization is terminated or revoked sooner. Performed at [facility], [place] [address] [place], [place] [address]   . WBC 12/09/2019 10.2  4.0 - 10.5 K/uL Final  . RBC 12/09/2019 3.86* 3.87 - 5.11 MIL/uL Final  . Hemoglobin 12/09/2019 12.6  12.0 - 15.0 g/dL Final  . HCT 12/09/2019 38.7  36.0 - 46.0 % Final  . MCV 12/09/2019 100.3* 80.0 - 100.0 fL Final  . MCH 12/09/2019 32.6  26.0 - 34.0 pg Final  . MCHC 12/09/2019 32.6  30.0 - 36.0 g/dL Final  . RDW 12/09/2019 12.4  11.5 - 15.5 % Final  . Platelets 12/09/2019 127* 150 - 400 K/uL  Final   Comment: CONSISTENT WITH PREVIOUS RESULT REPEATED TO VERIFY   . nRBC 12/09/2019 0.0  0.0 - 0.2 % Final   Performed at [place] [address] [place], [place] [address]  . Sodium 12/09/2019 134* 135 - 145 mmol/L Final  . Potassium 12/09/2019 4.8  3.5 - 5.1 mmol/L Final  . Chloride 12/09/2019 103  98 - 111 mmol/L Final  . CO2 12/09/2019 25  22 - 32 mmol/L Final  . Glucose, Bld 12/09/2019 126* 70 - 99 mg/dL Final   Glucose reference range  applies only to samples taken after fasting for at least 8 hours.  . BUN 12/09/2019 18  8 - 23 mg/dL Final  . Creatinine, Ser 12/09/2019 0.66  0.44 - 1.00 mg/dL Final  . Calcium 12/09/2019 8.8* 8.9 - 10.3 mg/dL Final  . GFR calc non Af Amer 12/09/2019 >60  >60 mL/min Final  . GFR calc Af Amer 12/09/2019 >60  >60 mL/min Final  . Anion gap 12/09/2019 6  5 - 15 Final   Performed at [facility], [place] [address], [place], [place] [address]  . WBC 12/10/2019 8.3  4.0 - 10.5 K/uL Final  . RBC 12/10/2019 3.31* 3.87 - 5.11 MIL/uL Final  . Hemoglobin 12/10/2019 10.9* 12.0 - 15.0 g/dL Final  . HCT 12/10/2019 33.1* 36.0 - 46.0 % Final  . MCV 12/10/2019 100.0  80.0 - 100.0 fL Final  . MCH 12/10/2019 32.9  26.0 - 34.0 pg Final  . MCHC 12/10/2019 32.9  30.0 - 36.0 g/dL Final  . RDW 12/10/2019 12.6  11.5 - 15.5 % Final  . Platelets 12/10/2019 147* 150 - 400 K/uL Final   Comment: SPECIMEN CHECKED FOR CLOTS REPEATED TO VERIFY   . nRBC 12/10/2019 0.0  0.0 - 0.2 % Final   Performed at [place] [address], [place], [place] [address]  . Sodium 12/10/2019 137  135 - 145 mmol/L Final  . Potassium 12/10/2019 4.0  3.5 - 5.1 mmol/L Final  . Chloride 12/10/2019 101  98 - 111 mmol/L Final  . CO2 12/10/2019 28  22 - 32 mmol/L Final  . Glucose, Bld 12/10/2019 106* 70 - 99 mg/dL Final   Glucose reference range applies only to samples taken after fasting for at least 8 hours.  . BUN 12/10/2019 19  8 - 23 mg/dL Final  . Creatinine, Ser 12/10/2019 0.66  0.44 - 1.00 mg/dL Final  . Calcium 12/10/2019 8.7* 8.9 - 10.3 mg/dL Final  . GFR calc non Af Amer 12/10/2019 >60  >60 mL/min Final  . GFR calc Af Amer 12/10/2019 >60  >60 mL/min Final  . Anion gap 12/10/2019 8  5 - 15 Final   Performed at [facility], [place] [address] [place], [place] [address]  . WBC 12/11/2019 7.1  4.0 - 10.5 K/uL Final  . RBC 12/11/2019 3.03* 3.87 - 5.11 MIL/uL Final  . Hemoglobin  12/11/2019 10.0* 12.0 - 15.0 g/dL Final  . HCT 12/11/2019 30.4* 36.0 - 46.0 % Final  . MCV 12/11/2019 100.3* 80.0 - 100.0 fL Final  . MCH 12/11/2019 33.0  26.0 - 34.0 pg Final  . MCHC 12/11/2019 32.9  30.0 - 36.0 g/dL Final  . RDW 12/11/2019 12.6  11.5 - 15.5 % Final  . Platelets 12/11/2019 143* 150 - 400 K/uL Final  . nRBC 12/11/2019 0.0  0.0 - 0.2 % Final   Performed at [facility], [place] [address]  [place], [place] [address]  Hospital Outpatient Visit on 12/02/2019  Component Date Value Ref Range Status  . aPTT 12/02/2019 31  24 - 36 seconds Final   Performed at [facility], [place] [address] [place], [place] [address]  . WBC 12/02/2019 7.9  4.0 - 10.5 K/uL Final  . RBC 12/02/2019 4.61  3.87 - 5.11 MIL/uL Final  . Hemoglobin 12/02/2019 15.4* 12.0 - 15.0 g/dL Final  . HCT 12/02/2019 47.0* 36.0 - 46.0 % Final  . MCV 12/02/2019 102.0* 80.0 - 100.0 fL Final  . MCH 12/02/2019 33.4  26.0 - 34.0 pg Final  . MCHC 12/02/2019 32.8  30.0 - 36.0 g/dL Final  . RDW 12/02/2019 12.2  11.5 - 15.5 % Final  . Platelets 12/02/2019 159  150 - 400 K/uL Final  . nRBC 12/02/2019 0.0  0.0 - 0.2 % Final   Performed at [facility], [place] [address] [place], [place] [address]  . Sodium 12/02/2019 142  135 - 145 mmol/L Final  . Potassium 12/02/2019 4.0  3.5 - 5.1 mmol/L Final  . Chloride 12/02/2019 104  98 - 111 mmol/L Final  . CO2 12/02/2019 28  22 - 32 mmol/L Final  . Glucose, Bld 12/02/2019 102* 70 - 99 mg/dL Final   Glucose reference range applies only to samples taken after fasting for at least 8 hours.  . BUN 12/02/2019 29* 8 - 23 mg/dL Final  . Creatinine, Ser 12/02/2019 0.90  0.44 - 1.00 mg/dL Final  . Calcium 12/02/2019 9.9  8.9 - 10.3 mg/dL Final  . Total Protein 12/02/2019 7.1  6.5 - 8.1 g/dL Final  . Albumin 12/02/2019 4.4  3.5 - 5.0 g/dL Final  . AST 12/02/2019 43* 15 - 41 U/L Final  . ALT 12/02/2019 71* 0 - 44 U/L Final  . Alkaline  Phosphatase 12/02/2019 102  38 - 126 U/L Final  . Total Bilirubin 12/02/2019 0.3  0.3 - 1.2 mg/dL Final  . GFR calc non Af Amer 12/02/2019 >60  >60 mL/min Final  . GFR calc Af Amer 12/02/2019 >60  >60 mL/min Final  . Anion gap 12/02/2019 10  5 - 15 Final   Performed at [facility], [place] [address], [place], [place] [address]  . Prothrombin Time 12/02/2019 12.7  11.4 - 15.2 seconds Final  . INR 12/02/2019 1.0  0.8 - 1.2 Final   Comment: (NOTE) INR goal varies based on device and disease states. Performed at [facility], [place] [address] [place], [place] [address]   . ABO/RH(D) 12/02/2019 O POS   Final  . Antibody Screen 12/02/2019 NEG   Final  . Sample Expiration 12/02/2019 12/11/2019,2359   Final  . Extend sample reason 12/02/2019    Final                   Value:NO TRANSFUSIONS OR PREGNANCY IN THE PAST 3 MONTHS Performed at [facility], [place] [address], [place], [place] [address]   . MRSA, PCR 12/02/2019 NEGATIVE  NEGATIVE Final  . Staphylococcus aureus 12/02/2019 NEGATIVE  NEGATIVE Final   Comment: (NOTE) The Xpert SA Assay (FDA approved for NASAL specimens in patients 87 years of age and older), is one component of a comprehensive surveillance program. It is not intended to diagnose infection nor to guide or monitor treatment. Performed at [facility], [place] [address], [place],  [address]   . ABO/RH(D) 12/02/2019    Final  Value:O POS Performed at [facility], [place] [address] [place], [place] [address]      X-Rays:DG Pelvis Portable  Result Date: 12/08/2019 CLINICAL DATA:  Left hip replacement EXAM: PORTABLE PELVIS 1-2 VIEWS COMPARISON:  None. FINDINGS: Changes of left hip replacement. Normal AP alignment. No hardware bony complicating feature. IMPRESSION: Left hip replacement.  No visible complicating feature. Electronically Signed   By: Rolm Baptise M.D.    On: 12/08/2019 18:56   DG C-Arm 1-60 Min-No Report  Result Date: 12/08/2019 CLINICAL DATA:  Primary osteoarthritis of the left hip. EXAM: OPERATIVE LEFT HIP WITH PELVIS; DG C-ARM 1-60 MIN-NO REPORT COMPARISON:  CT scan of the abdomen and pelvis dated 10/05/2015 FINDINGS: AP C-arm images demonstrate that the acetabular and femoral components of left total hip prosthesis appear in excellent position. No fractures. IMPRESSION: Satisfactory appearance of the left hip in the AP projection after total hip prosthesis insertion. FLUOROSCOPY TIME:  4 seconds C-arm fluoroscopic images were obtained intraoperatively and submitted for post operative interpretation. Electronically Signed   By: Lorriane Shire M.D.   On: 12/08/2019 16:18   DG HIP OPERATIVE UNILAT W OR W/O PELVIS LEFT  Result Date: 12/08/2019 CLINICAL DATA:  Primary osteoarthritis of the left hip. EXAM: OPERATIVE LEFT HIP WITH PELVIS; DG C-ARM 1-60 MIN-NO REPORT COMPARISON:  CT scan of the abdomen and pelvis dated 10/05/2015 FINDINGS: AP C-arm images demonstrate that the acetabular and femoral components of left total hip prosthesis appear in excellent position. No fractures. IMPRESSION: Satisfactory appearance of the left hip in the AP projection after total hip prosthesis insertion. FLUOROSCOPY TIME:  4 seconds C-arm fluoroscopic images were obtained intraoperatively and submitted for post operative interpretation. Electronically Signed   By: Lorriane Shire M.D.   On: 12/08/2019 16:18    EKG:No orders found for this or any previous visit.   Hospital Course: Jessica Serrano is a 79 y.o. who was admitted to [facility]. They were brought to the operating room on 12/08/2019 and underwent Procedure(s): [place].  Patient tolerated the procedure well and was later transferred to the recovery room and then to the orthopaedic floor for postoperative care. They were given PO and IV analgesics for pain control  following their surgery. They were given 24 hours of postoperative antibiotics of  Anti-infectives (From admission, onward)   Start     Dose/Rate Route Frequency Ordered Stop   12/08/19 2100  ceFAZolin (ANCEF) IVPB 2g/100 mL premix     2 g 200 mL/hr over 30 Minutes Intravenous Every 6 hours 12/08/19 1829 12/09/19 0451   12/08/19 1315  ceFAZolin (ANCEF) IVPB 2g/100 mL premix     2 g 200 mL/hr over 30 Minutes Intravenous On call to O.R. 12/08/19 1233 12/08/19 1459     and started on DVT prophylaxis in the form of Aspirin.   PT and OT were ordered for total joint protocol. Discharge planning consulted to help with postop disposition and equipment needs. Patient had a good night on the evening of surgery. They started to get up OOB with therapy on POD #1. Continued to work with therapy into POD #2. Pt was seen during rounds on day three and was ready to go to SNF pending progress with therapy. Aquacel clean, dry, intact. Pt worked with therapy for one additional session and was meeting their goals. She was discharged to [facility] later that day in stable condition.  Diet: Regular diet Activity: WBAT Follow-up: in 2 weeks  Disposition: Home Discharged Condition: good   Discharge Instructions    Call MD / Call 911   Complete by: As directed    If you experience chest pain or shortness of breath, CALL 911 and be transported to the hospital emergency room.  If you develope a fever above 101 F, pus (white drainage) or increased drainage or redness at the wound, or calf pain, call your surgeon's office.   Change dressing   Complete by: As directed    You have an adhesive waterproof bandage over the incision. Leave this in place until your first follow-up appointment. Once you remove this you will not need to place another bandage.   Constipation Prevention   Complete by: As directed    Drink plenty of fluids.  Prune juice may be helpful.  You may use a stool softener, such as Colace (over  the counter) 100 mg twice a day.  Use MiraLax (over the counter) for constipation as needed.   Diet - low sodium heart healthy   Complete by: As directed    Do not sit on low chairs, stoools or toilet seats, as it may be difficult to get up from low surfaces   Complete by: As directed    Driving restrictions   Complete by: As directed    No driving for two weeks   TED hose   Complete by: As directed    Use stockings (TED hose) for three weeks on both leg(s).  You may remove them at night for sleeping.   Weight bearing as tolerated   Complete by: As directed      Allergies as of 12/11/2019   No Known Allergies     Medication List    STOP taking these medications   meloxicam 15 MG tablet Commonly known as: MOBIC   oxyCODONE-acetaminophen 10-325 MG tablet Commonly known as: PERCOCET     TAKE these medications   acetaminophen 325 MG tablet Commonly known as: TYLENOL Take 3 tablets (975 mg total) by mouth every 8 (eight) hours as needed for mild pain (pain score 1-3 or temp > 100.5).   aspirin 325 MG EC tablet Take 1 tablet (325 mg total) by mouth 2 (two) times daily for 21 days. Then take aspirin 81 mg daily for another 3 weeks.   atenolol 50 MG tablet Commonly known as: TENORMIN Take 50 mg by mouth daily.   atorvastatin 20 MG tablet Commonly known as: LIPITOR Take 20 mg by mouth daily.   baclofen 10 MG tablet Commonly known as: LIORESAL Take 10 mg by mouth 3 (three) times daily.   docusate sodium 100 MG capsule Commonly known as: COLACE Take 100 mg by mouth 2 (two) times daily as needed for mild constipation.   furosemide 40 MG tablet Commonly known as: LASIX Take 40 mg by mouth 2 (two) times daily.   gabapentin 600 MG tablet Commonly known as: NEURONTIN Take 600-1,200 mg by mouth See admin instructions. 600 mg in the  Morning, and 1200 mg at bedtime   losartan 25 MG tablet Commonly known as: COZAAR Take 25 mg by mouth daily.   oxyCODONE 5 MG immediate  release tablet Commonly known as: Oxy IR/ROXICODONE Take 1-2 tablets (5-10 mg total) by mouth every 6 (six) hours as needed for moderate pain (pain score 4-6).   rOPINIRole 1 MG tablet Commonly known as: REQUIP Take 1 mg by mouth 2 (two) times daily.   temazepam 15 MG capsule Commonly known as: RESTORIL Take 30 mg  by mouth at bedtime.   venlafaxine 75 MG tablet Commonly known as: EFFEXOR Take 75 mg by mouth 2 (two) times daily.            Discharge Care Instructions  (From admission, onward)         Start     Ordered   12/09/19 0000  Weight bearing as tolerated     12/09/19 1225   12/09/19 0000  Change dressing    Comments: You have an adhesive waterproof bandage over the incision. Leave this in place until your first follow-up appointment. Once you remove this you will not need to place another bandage.   12/09/19 1225         Follow-up Information    Gaynelle Arabian, MD. Go on 12/21/2019.   Specialty: Orthopedic Surgery Why: You are scheduled for a post-operative appointment on 12-21-19 at 2:15 pm.  Contact information: [address] [place] [address] [phone]           Signed: Griffith Citron, PA-C Orthopedic Surgery 12/11/2019, 9:34 AM

## 2019-12-11 NOTE — Progress Notes (Addendum)
CSW faxed pt's D/C summary as requested and confirmed it was received.  CSW completing D/C packet now.  CSW will continue to follow for D/C needs.  Alphonse Guild. Meara Wiechman  MSW, LCSW, LCAS, CCS Transitions of Care Clinical Social Worker Care Coordination Department Ph: [phone]

## 2019-12-11 NOTE — Progress Notes (Signed)
Physical Therapy Treatment Patient Details Name: Jessica Serrano MRN: [id number] DOB: 1940/11/29 Today's Date: 12/11/2019    History of Present Illness Pt s/p L THR and with hx of COPD, L TKR, back sugery, Bells Palsy.    PT Comments    Pt continues to require increased time for all tasks but is demonstrating slow but steady progress with all mobility tasks.  Pt scheduled for dc to SNF this date.   Follow Up Recommendations  SNF     Equipment Recommendations  None recommended by PT    Recommendations for Other Services       Precautions / Restrictions Precautions Precautions: Fall Restrictions Weight Bearing Restrictions: No Other Position/Activity Restrictions: WBAT    Mobility  Bed Mobility Overal bed mobility: Needs Assistance Bed Mobility: Supine to Sit     Supine to sit: Mod assist;HOB elevated     General bed mobility comments: cues for sequence with assist to manage L LE and to bring trunk to upright sitting  Transfers Overall transfer level: Needs assistance Equipment used: Rolling walker (2 wheeled) Transfers: Sit to/from Stand Sit to Stand: Min assist;From elevated surface         General transfer comment: cues for LE management and use of UEs to self assist  Ambulation/Gait Ambulation/Gait assistance: +2 safety/equipment;Min assist(chair follow for safety) Gait Distance (Feet): 42 Feet Assistive device: Rolling walker (2 wheeled) Gait Pattern/deviations: Step-to pattern;Decreased step length - right;Decreased step length - left;Shuffle;Trunk flexed Gait velocity: decr   General Gait Details: cues for sequence, posture and position from RW; distance ltd by fatigue   Stairs             Wheelchair Mobility    Modified Rankin (Stroke Patients Only)       Balance Overall balance assessment: Needs assistance Sitting-balance support: No upper extremity supported;Feet supported Sitting balance-Leahy Scale: Good     Standing  balance support: Bilateral upper extremity supported Standing balance-Leahy Scale: Fair                              Cognition Arousal/Alertness: Awake/alert Behavior During Therapy: WFL for tasks assessed/performed Overall Cognitive Status: Within Functional Limits for tasks assessed                                        Exercises Total Joint Exercises Ankle Circles/Pumps: AROM;Both;15 reps;Supine Quad Sets: AROM;Both;10 reps;Supine Heel Slides: AAROM;Supine;Left;20 reps Hip ABduction/ADduction: AAROM;Left;Supine;20 reps    General Comments        Pertinent Vitals/Pain Pain Assessment: 0-10 Pain Score: 4  Pain Location: L hip Pain Descriptors / Indicators: Aching;Moaning;Guarding;Grimacing Pain Intervention(s): Limited activity within patient's tolerance;Monitored during session;Premedicated before session;Ice applied    Home Living                      Prior Function            PT Goals (current goals can now be found in the care plan section) Acute Rehab PT Goals Patient Stated Goal: Regain IND PT Goal Formulation: With patient Time For Goal Achievement: 12/23/19 Potential to Achieve Goals: Good Progress towards PT goals: Progressing toward goals    Frequency    7X/week      PT Plan Current plan remains appropriate    Co-evaluation  AM-PAC PT "6 Clicks" Mobility   Outcome Measure  Help needed turning from your back to your side while in a flat bed without using bedrails?: A Lot Help needed moving from lying on your back to sitting on the side of a flat bed without using bedrails?: A Lot Help needed moving to and from a bed to a chair (including a wheelchair)?: A Lot Help needed standing up from a chair using your arms (e.g., wheelchair or bedside chair)?: A Little Help needed to walk in hospital room?: A Little Help needed climbing 3-5 steps with a railing? : Total 6 Click Score: 13    End of  Session Equipment Utilized During Treatment: Gait belt Activity Tolerance: Patient tolerated treatment well Patient left: in chair;with call bell/phone within reach;with chair alarm set Nurse Communication: Mobility status PT Visit Diagnosis: Difficulty in walking, not elsewhere classified (R26.2)     Time: 4599-7741 PT Time Calculation (min) (ACUTE ONLY): 46 min  Charges:  $Gait Training: 23-37 mins $Therapeutic Exercise: 8-22 mins                     [place] Pager [phone] Office [phone]    Braelin Brosch 12/11/2019, 11:15 AM

## 2019-12-11 NOTE — Plan of Care (Signed)
Patient discharged home in stable condition 

## 2019-12-11 NOTE — Progress Notes (Addendum)
CSW spoke to the pt's RN CM who stated pt's D/C Summary would need to be sent before 1:30pm.  CSW reviewed chart and sees that per the RN CM from 12/10/19 report was to be called in before 10am.  CSW (this Probation officer) was unaware of the pt D/C'ing but at request of pt's RN will complete pt's D/c packet now.  Per [facility] they need it faxed to: [phone].  CSW will continue to follow for D/C needs.  Jessica Serrano. Jessica Serrano  MSW, LCSW, LCAS, CCS Transitions of Care Clinical Social Worker Care Coordination Department Ph: [phone]

## 2019-12-11 NOTE — Progress Notes (Signed)
   Subjective: 3 Days Post-Op Procedure(s) (LRB): TOTAL HIP ARTHROPLASTY ANTERIOR APPROACH (Left) Patient reports pain as mild.   Patient seen in rounds for Dr. Wynelle Link. Patient is well, and has had no acute complaints or problems. Working with PT on exam this morning. She is doing well today. She is eager to discharge to SNF today. Denies CP, SHOB.  We will continue  therapy today.   Objective: Vital signs in last 24 hours: Temp:  [98.8 F (37.1 C)-99 F (37.2 C)] 98.9 F (37.2 C) (06/05 0503) Pulse Rate:  [69-77] 77 (06/05 0503) Resp:  [14-17] 14 (06/05 0503) BP: (122-129)/(44-56) 129/47 (06/05 0503) SpO2:  [94 %-98 %] 95 % (06/05 0503)  Intake/Output from previous day:  Intake/Output Summary (Last 24 hours) at 12/11/2019 0924 Last data filed at 12/11/2019 0503 Gross per 24 hour  Intake 360 ml  Output 1750 ml  Net -1390 ml     Intake/Output this shift: No intake/output data recorded.  Labs: Recent Labs    12/09/19 0243 12/10/19 0306 12/11/19 0318  HGB 12.6 10.9* 10.0*   Recent Labs    12/10/19 0306 12/11/19 0318  WBC 8.3 7.1  RBC 3.31* 3.03*  HCT 33.1* 30.4*  PLT 147* 143*   Recent Labs    12/09/19 0243 12/10/19 0306  NA 134* 137  K 4.8 4.0  CL 103 101  CO2 25 28  BUN 18 19  CREATININE 0.66 0.66  GLUCOSE 126* 106*  CALCIUM 8.8* 8.7*   No results for input(s): LABPT, INR in the last 72 hours.  Exam: General - Patient is Alert and Oriented Extremity - Neurologically intact Sensation intact distally Intact pulses distally Dorsiflexion/Plantar flexion intact Dressing - dressing C/D/I Motor Function - intact, moving foot and toes well on exam.   Past Medical History:  Diagnosis Date   Bell's palsy    right   COPD (chronic obstructive pulmonary disease) (HCC)    mild   Decrease in appetite 09/25/2015   Depression    Diarrhea 09/25/2015   Dyspnea    Hemochromatosis    High blood pressure    History of vaginal bleeding 09/05/2015   LLQ pain  09/25/2015   Neuropathy    Osteoarthritis    knees   Vaginal atrophy 09/05/2015   Weight loss 09/25/2015    Assessment/Plan: 3 Days Post-Op Procedure(s) (LRB): TOTAL HIP ARTHROPLASTY ANTERIOR APPROACH (Left) Principal Problem:   OA (osteoarthritis) of hip Active Problems:   Primary osteoarthritis of left hip  Estimated body mass index is 37.26 kg/m as calculated from the following:   Height as of this encounter: 5' 4.5" (1.638 m).   Weight as of this encounter: 100 kg. Advance diet Up with therapy D/C IV fluids  DVT Prophylaxis - Aspirin Weight bearing as tolerated.  Plan is to go Skilled nursing facility after hospital stay. Plan for discharge today to SNF as long as she continues to do well with therapy. Follow up as previously scheduled on 6/17 with Dr. Wynelle Link. Aquacel to remain in place.   Griffith Citron, PA-C Orthopedic Surgery [phone] 12/11/2019, 9:24 AM

## 2019-12-12 DIAGNOSIS — F331 Major depressive disorder, recurrent, moderate: Secondary | ICD-10-CM | POA: Diagnosis not present

## 2019-12-12 DIAGNOSIS — Z96642 Presence of left artificial hip joint: Secondary | ICD-10-CM | POA: Diagnosis not present

## 2019-12-14 DIAGNOSIS — R251 Tremor, unspecified: Secondary | ICD-10-CM | POA: Diagnosis not present

## 2019-12-14 DIAGNOSIS — R410 Disorientation, unspecified: Secondary | ICD-10-CM | POA: Diagnosis not present

## 2019-12-14 DIAGNOSIS — R531 Weakness: Secondary | ICD-10-CM | POA: Diagnosis not present

## 2019-12-16 DIAGNOSIS — M545 Low back pain: Secondary | ICD-10-CM | POA: Diagnosis not present

## 2019-12-17 DIAGNOSIS — Z96642 Presence of left artificial hip joint: Secondary | ICD-10-CM | POA: Diagnosis not present

## 2019-12-17 DIAGNOSIS — F331 Major depressive disorder, recurrent, moderate: Secondary | ICD-10-CM | POA: Diagnosis not present

## 2020-01-06 DIAGNOSIS — R2689 Other abnormalities of gait and mobility: Secondary | ICD-10-CM | POA: Diagnosis not present

## 2020-01-06 DIAGNOSIS — R41841 Cognitive communication deficit: Secondary | ICD-10-CM | POA: Diagnosis not present

## 2020-01-06 DIAGNOSIS — M179 Osteoarthritis of knee, unspecified: Secondary | ICD-10-CM | POA: Diagnosis not present

## 2020-01-06 DIAGNOSIS — J449 Chronic obstructive pulmonary disease, unspecified: Secondary | ICD-10-CM | POA: Diagnosis not present

## 2020-01-06 DIAGNOSIS — G4700 Insomnia, unspecified: Secondary | ICD-10-CM | POA: Diagnosis not present

## 2020-01-06 DIAGNOSIS — M6281 Muscle weakness (generalized): Secondary | ICD-10-CM | POA: Diagnosis not present

## 2020-01-06 DIAGNOSIS — R609 Edema, unspecified: Secondary | ICD-10-CM | POA: Diagnosis not present

## 2020-01-06 DIAGNOSIS — I10 Essential (primary) hypertension: Secondary | ICD-10-CM | POA: Diagnosis not present

## 2020-01-06 DIAGNOSIS — Z96642 Presence of left artificial hip joint: Secondary | ICD-10-CM | POA: Diagnosis not present

## 2020-01-06 DIAGNOSIS — F339 Major depressive disorder, recurrent, unspecified: Secondary | ICD-10-CM | POA: Diagnosis not present

## 2020-01-06 DIAGNOSIS — G629 Polyneuropathy, unspecified: Secondary | ICD-10-CM | POA: Diagnosis not present

## 2020-01-06 DIAGNOSIS — Z471 Aftercare following joint replacement surgery: Secondary | ICD-10-CM | POA: Diagnosis not present

## 2020-01-11 DIAGNOSIS — E785 Hyperlipidemia, unspecified: Secondary | ICD-10-CM | POA: Diagnosis not present

## 2020-01-11 DIAGNOSIS — Z9181 History of falling: Secondary | ICD-10-CM | POA: Diagnosis not present

## 2020-01-11 DIAGNOSIS — I5032 Chronic diastolic (congestive) heart failure: Secondary | ICD-10-CM | POA: Diagnosis not present

## 2020-01-11 DIAGNOSIS — M1711 Unilateral primary osteoarthritis, right knee: Secondary | ICD-10-CM | POA: Diagnosis not present

## 2020-01-11 DIAGNOSIS — G510 Bell's palsy: Secondary | ICD-10-CM | POA: Diagnosis not present

## 2020-01-11 DIAGNOSIS — Z6839 Body mass index (BMI) 39.0-39.9, adult: Secondary | ICD-10-CM | POA: Diagnosis not present

## 2020-01-11 DIAGNOSIS — Z471 Aftercare following joint replacement surgery: Secondary | ICD-10-CM | POA: Diagnosis not present

## 2020-01-11 DIAGNOSIS — M5136 Other intervertebral disc degeneration, lumbar region: Secondary | ICD-10-CM | POA: Diagnosis not present

## 2020-01-11 DIAGNOSIS — Z79891 Long term (current) use of opiate analgesic: Secondary | ICD-10-CM | POA: Diagnosis not present

## 2020-01-11 DIAGNOSIS — G2581 Restless legs syndrome: Secondary | ICD-10-CM | POA: Diagnosis not present

## 2020-01-11 DIAGNOSIS — J449 Chronic obstructive pulmonary disease, unspecified: Secondary | ICD-10-CM | POA: Diagnosis not present

## 2020-01-11 DIAGNOSIS — Z96642 Presence of left artificial hip joint: Secondary | ICD-10-CM | POA: Diagnosis not present

## 2020-01-11 DIAGNOSIS — G629 Polyneuropathy, unspecified: Secondary | ICD-10-CM | POA: Diagnosis not present

## 2020-01-11 DIAGNOSIS — I110 Hypertensive heart disease with heart failure: Secondary | ICD-10-CM | POA: Diagnosis not present

## 2020-01-11 DIAGNOSIS — G8929 Other chronic pain: Secondary | ICD-10-CM | POA: Diagnosis not present

## 2020-01-11 DIAGNOSIS — F331 Major depressive disorder, recurrent, moderate: Secondary | ICD-10-CM | POA: Diagnosis not present

## 2020-01-13 DIAGNOSIS — G629 Polyneuropathy, unspecified: Secondary | ICD-10-CM | POA: Diagnosis not present

## 2020-01-13 DIAGNOSIS — Z471 Aftercare following joint replacement surgery: Secondary | ICD-10-CM | POA: Diagnosis not present

## 2020-01-13 DIAGNOSIS — Z96642 Presence of left artificial hip joint: Secondary | ICD-10-CM | POA: Diagnosis not present

## 2020-01-13 DIAGNOSIS — M1711 Unilateral primary osteoarthritis, right knee: Secondary | ICD-10-CM | POA: Diagnosis not present

## 2020-01-13 DIAGNOSIS — G8929 Other chronic pain: Secondary | ICD-10-CM | POA: Diagnosis not present

## 2020-01-13 DIAGNOSIS — M5136 Other intervertebral disc degeneration, lumbar region: Secondary | ICD-10-CM | POA: Diagnosis not present

## 2020-01-14 DIAGNOSIS — Z471 Aftercare following joint replacement surgery: Secondary | ICD-10-CM | POA: Diagnosis not present

## 2020-01-14 DIAGNOSIS — M5136 Other intervertebral disc degeneration, lumbar region: Secondary | ICD-10-CM | POA: Diagnosis not present

## 2020-01-14 DIAGNOSIS — G8929 Other chronic pain: Secondary | ICD-10-CM | POA: Diagnosis not present

## 2020-01-14 DIAGNOSIS — G629 Polyneuropathy, unspecified: Secondary | ICD-10-CM | POA: Diagnosis not present

## 2020-01-14 DIAGNOSIS — M1711 Unilateral primary osteoarthritis, right knee: Secondary | ICD-10-CM | POA: Diagnosis not present

## 2020-01-14 DIAGNOSIS — Z96642 Presence of left artificial hip joint: Secondary | ICD-10-CM | POA: Diagnosis not present

## 2020-01-17 DIAGNOSIS — R4582 Worries: Secondary | ICD-10-CM | POA: Diagnosis not present

## 2020-01-19 DIAGNOSIS — M1711 Unilateral primary osteoarthritis, right knee: Secondary | ICD-10-CM | POA: Diagnosis not present

## 2020-01-19 DIAGNOSIS — G629 Polyneuropathy, unspecified: Secondary | ICD-10-CM | POA: Diagnosis not present

## 2020-01-19 DIAGNOSIS — M5136 Other intervertebral disc degeneration, lumbar region: Secondary | ICD-10-CM | POA: Diagnosis not present

## 2020-01-19 DIAGNOSIS — Z471 Aftercare following joint replacement surgery: Secondary | ICD-10-CM | POA: Diagnosis not present

## 2020-01-19 DIAGNOSIS — G8929 Other chronic pain: Secondary | ICD-10-CM | POA: Diagnosis not present

## 2020-01-19 DIAGNOSIS — Z96642 Presence of left artificial hip joint: Secondary | ICD-10-CM | POA: Diagnosis not present

## 2020-01-21 DIAGNOSIS — G629 Polyneuropathy, unspecified: Secondary | ICD-10-CM | POA: Diagnosis not present

## 2020-01-21 DIAGNOSIS — Z471 Aftercare following joint replacement surgery: Secondary | ICD-10-CM | POA: Diagnosis not present

## 2020-01-21 DIAGNOSIS — Z96642 Presence of left artificial hip joint: Secondary | ICD-10-CM | POA: Diagnosis not present

## 2020-01-21 DIAGNOSIS — M1711 Unilateral primary osteoarthritis, right knee: Secondary | ICD-10-CM | POA: Diagnosis not present

## 2020-01-21 DIAGNOSIS — M5136 Other intervertebral disc degeneration, lumbar region: Secondary | ICD-10-CM | POA: Diagnosis not present

## 2020-01-21 DIAGNOSIS — G8929 Other chronic pain: Secondary | ICD-10-CM | POA: Diagnosis not present

## 2020-01-24 DIAGNOSIS — Z471 Aftercare following joint replacement surgery: Secondary | ICD-10-CM | POA: Diagnosis not present

## 2020-01-24 DIAGNOSIS — M1711 Unilateral primary osteoarthritis, right knee: Secondary | ICD-10-CM | POA: Diagnosis not present

## 2020-01-24 DIAGNOSIS — G8929 Other chronic pain: Secondary | ICD-10-CM | POA: Diagnosis not present

## 2020-01-24 DIAGNOSIS — M5136 Other intervertebral disc degeneration, lumbar region: Secondary | ICD-10-CM | POA: Diagnosis not present

## 2020-01-24 DIAGNOSIS — G629 Polyneuropathy, unspecified: Secondary | ICD-10-CM | POA: Diagnosis not present

## 2020-01-24 DIAGNOSIS — Z96642 Presence of left artificial hip joint: Secondary | ICD-10-CM | POA: Diagnosis not present

## 2020-01-26 DIAGNOSIS — M5136 Other intervertebral disc degeneration, lumbar region: Secondary | ICD-10-CM | POA: Diagnosis not present

## 2020-01-26 DIAGNOSIS — M1711 Unilateral primary osteoarthritis, right knee: Secondary | ICD-10-CM | POA: Diagnosis not present

## 2020-01-26 DIAGNOSIS — Z471 Aftercare following joint replacement surgery: Secondary | ICD-10-CM | POA: Diagnosis not present

## 2020-01-26 DIAGNOSIS — Z96642 Presence of left artificial hip joint: Secondary | ICD-10-CM | POA: Diagnosis not present

## 2020-01-26 DIAGNOSIS — G629 Polyneuropathy, unspecified: Secondary | ICD-10-CM | POA: Diagnosis not present

## 2020-01-26 DIAGNOSIS — G8929 Other chronic pain: Secondary | ICD-10-CM | POA: Diagnosis not present

## 2020-01-28 ENCOUNTER — Other Ambulatory Visit: Payer: Self-pay | Admitting: Orthopedic Surgery

## 2020-01-28 DIAGNOSIS — M19011 Primary osteoarthritis, right shoulder: Secondary | ICD-10-CM | POA: Diagnosis not present

## 2020-01-28 DIAGNOSIS — M25512 Pain in left shoulder: Secondary | ICD-10-CM | POA: Diagnosis not present

## 2020-01-28 DIAGNOSIS — M19012 Primary osteoarthritis, left shoulder: Secondary | ICD-10-CM | POA: Diagnosis not present

## 2020-01-28 DIAGNOSIS — M25511 Pain in right shoulder: Secondary | ICD-10-CM

## 2020-02-01 DIAGNOSIS — G8929 Other chronic pain: Secondary | ICD-10-CM | POA: Diagnosis not present

## 2020-02-01 DIAGNOSIS — Z96642 Presence of left artificial hip joint: Secondary | ICD-10-CM | POA: Diagnosis not present

## 2020-02-01 DIAGNOSIS — Z471 Aftercare following joint replacement surgery: Secondary | ICD-10-CM | POA: Diagnosis not present

## 2020-02-01 DIAGNOSIS — M5136 Other intervertebral disc degeneration, lumbar region: Secondary | ICD-10-CM | POA: Diagnosis not present

## 2020-02-01 DIAGNOSIS — M1711 Unilateral primary osteoarthritis, right knee: Secondary | ICD-10-CM | POA: Diagnosis not present

## 2020-02-01 DIAGNOSIS — G629 Polyneuropathy, unspecified: Secondary | ICD-10-CM | POA: Diagnosis not present

## 2020-02-02 DIAGNOSIS — G8929 Other chronic pain: Secondary | ICD-10-CM | POA: Diagnosis not present

## 2020-02-02 DIAGNOSIS — Z471 Aftercare following joint replacement surgery: Secondary | ICD-10-CM | POA: Diagnosis not present

## 2020-02-02 DIAGNOSIS — Z96642 Presence of left artificial hip joint: Secondary | ICD-10-CM | POA: Diagnosis not present

## 2020-02-02 DIAGNOSIS — M5136 Other intervertebral disc degeneration, lumbar region: Secondary | ICD-10-CM | POA: Diagnosis not present

## 2020-02-02 DIAGNOSIS — M1711 Unilateral primary osteoarthritis, right knee: Secondary | ICD-10-CM | POA: Diagnosis not present

## 2020-02-02 DIAGNOSIS — G629 Polyneuropathy, unspecified: Secondary | ICD-10-CM | POA: Diagnosis not present

## 2020-02-09 DIAGNOSIS — G629 Polyneuropathy, unspecified: Secondary | ICD-10-CM | POA: Diagnosis not present

## 2020-02-09 DIAGNOSIS — F331 Major depressive disorder, recurrent, moderate: Secondary | ICD-10-CM | POA: Diagnosis not present

## 2020-02-09 DIAGNOSIS — J449 Chronic obstructive pulmonary disease, unspecified: Secondary | ICD-10-CM | POA: Diagnosis not present

## 2020-02-09 DIAGNOSIS — M19011 Primary osteoarthritis, right shoulder: Secondary | ICD-10-CM | POA: Diagnosis not present

## 2020-02-09 DIAGNOSIS — G622 Polyneuropathy due to other toxic agents: Secondary | ICD-10-CM | POA: Diagnosis not present

## 2020-02-09 DIAGNOSIS — M19012 Primary osteoarthritis, left shoulder: Secondary | ICD-10-CM | POA: Diagnosis not present

## 2020-02-09 DIAGNOSIS — G8929 Other chronic pain: Secondary | ICD-10-CM | POA: Diagnosis not present

## 2020-02-09 DIAGNOSIS — M1711 Unilateral primary osteoarthritis, right knee: Secondary | ICD-10-CM | POA: Diagnosis not present

## 2020-02-09 DIAGNOSIS — Z96642 Presence of left artificial hip joint: Secondary | ICD-10-CM | POA: Diagnosis not present

## 2020-02-09 DIAGNOSIS — M5136 Other intervertebral disc degeneration, lumbar region: Secondary | ICD-10-CM | POA: Diagnosis not present

## 2020-02-09 DIAGNOSIS — Z471 Aftercare following joint replacement surgery: Secondary | ICD-10-CM | POA: Diagnosis not present

## 2020-02-10 DIAGNOSIS — Z79891 Long term (current) use of opiate analgesic: Secondary | ICD-10-CM | POA: Diagnosis not present

## 2020-02-10 DIAGNOSIS — Z6839 Body mass index (BMI) 39.0-39.9, adult: Secondary | ICD-10-CM | POA: Diagnosis not present

## 2020-02-10 DIAGNOSIS — J449 Chronic obstructive pulmonary disease, unspecified: Secondary | ICD-10-CM | POA: Diagnosis not present

## 2020-02-10 DIAGNOSIS — G510 Bell's palsy: Secondary | ICD-10-CM | POA: Diagnosis not present

## 2020-02-10 DIAGNOSIS — I110 Hypertensive heart disease with heart failure: Secondary | ICD-10-CM | POA: Diagnosis not present

## 2020-02-10 DIAGNOSIS — Z96642 Presence of left artificial hip joint: Secondary | ICD-10-CM | POA: Diagnosis not present

## 2020-02-10 DIAGNOSIS — Z471 Aftercare following joint replacement surgery: Secondary | ICD-10-CM | POA: Diagnosis not present

## 2020-02-10 DIAGNOSIS — I5032 Chronic diastolic (congestive) heart failure: Secondary | ICD-10-CM | POA: Diagnosis not present

## 2020-02-10 DIAGNOSIS — G2581 Restless legs syndrome: Secondary | ICD-10-CM | POA: Diagnosis not present

## 2020-02-10 DIAGNOSIS — Z9181 History of falling: Secondary | ICD-10-CM | POA: Diagnosis not present

## 2020-02-10 DIAGNOSIS — M5136 Other intervertebral disc degeneration, lumbar region: Secondary | ICD-10-CM | POA: Diagnosis not present

## 2020-02-10 DIAGNOSIS — G8929 Other chronic pain: Secondary | ICD-10-CM | POA: Diagnosis not present

## 2020-02-10 DIAGNOSIS — E785 Hyperlipidemia, unspecified: Secondary | ICD-10-CM | POA: Diagnosis not present

## 2020-02-10 DIAGNOSIS — M1711 Unilateral primary osteoarthritis, right knee: Secondary | ICD-10-CM | POA: Diagnosis not present

## 2020-02-10 DIAGNOSIS — G629 Polyneuropathy, unspecified: Secondary | ICD-10-CM | POA: Diagnosis not present

## 2020-02-10 DIAGNOSIS — F331 Major depressive disorder, recurrent, moderate: Secondary | ICD-10-CM | POA: Diagnosis not present

## 2020-02-11 DIAGNOSIS — Z471 Aftercare following joint replacement surgery: Secondary | ICD-10-CM | POA: Diagnosis not present

## 2020-02-11 DIAGNOSIS — M1711 Unilateral primary osteoarthritis, right knee: Secondary | ICD-10-CM | POA: Diagnosis not present

## 2020-02-11 DIAGNOSIS — G629 Polyneuropathy, unspecified: Secondary | ICD-10-CM | POA: Diagnosis not present

## 2020-02-11 DIAGNOSIS — Z96642 Presence of left artificial hip joint: Secondary | ICD-10-CM | POA: Diagnosis not present

## 2020-02-11 DIAGNOSIS — M5136 Other intervertebral disc degeneration, lumbar region: Secondary | ICD-10-CM | POA: Diagnosis not present

## 2020-02-11 DIAGNOSIS — G8929 Other chronic pain: Secondary | ICD-10-CM | POA: Diagnosis not present

## 2020-02-14 DIAGNOSIS — G629 Polyneuropathy, unspecified: Secondary | ICD-10-CM | POA: Diagnosis not present

## 2020-02-14 DIAGNOSIS — M5136 Other intervertebral disc degeneration, lumbar region: Secondary | ICD-10-CM | POA: Diagnosis not present

## 2020-02-14 DIAGNOSIS — Z96642 Presence of left artificial hip joint: Secondary | ICD-10-CM | POA: Diagnosis not present

## 2020-02-14 DIAGNOSIS — Z471 Aftercare following joint replacement surgery: Secondary | ICD-10-CM | POA: Diagnosis not present

## 2020-02-14 DIAGNOSIS — M1711 Unilateral primary osteoarthritis, right knee: Secondary | ICD-10-CM | POA: Diagnosis not present

## 2020-02-14 DIAGNOSIS — G8929 Other chronic pain: Secondary | ICD-10-CM | POA: Diagnosis not present

## 2020-02-16 DIAGNOSIS — Z471 Aftercare following joint replacement surgery: Secondary | ICD-10-CM | POA: Diagnosis not present

## 2020-02-16 DIAGNOSIS — M1711 Unilateral primary osteoarthritis, right knee: Secondary | ICD-10-CM | POA: Diagnosis not present

## 2020-02-16 DIAGNOSIS — G8929 Other chronic pain: Secondary | ICD-10-CM | POA: Diagnosis not present

## 2020-02-16 DIAGNOSIS — Z96642 Presence of left artificial hip joint: Secondary | ICD-10-CM | POA: Diagnosis not present

## 2020-02-16 DIAGNOSIS — G629 Polyneuropathy, unspecified: Secondary | ICD-10-CM | POA: Diagnosis not present

## 2020-02-16 DIAGNOSIS — M5136 Other intervertebral disc degeneration, lumbar region: Secondary | ICD-10-CM | POA: Diagnosis not present

## 2020-02-22 DIAGNOSIS — Z471 Aftercare following joint replacement surgery: Secondary | ICD-10-CM | POA: Diagnosis not present

## 2020-02-22 DIAGNOSIS — M5136 Other intervertebral disc degeneration, lumbar region: Secondary | ICD-10-CM | POA: Diagnosis not present

## 2020-02-22 DIAGNOSIS — Z96642 Presence of left artificial hip joint: Secondary | ICD-10-CM | POA: Diagnosis not present

## 2020-02-22 DIAGNOSIS — G629 Polyneuropathy, unspecified: Secondary | ICD-10-CM | POA: Diagnosis not present

## 2020-02-22 DIAGNOSIS — G8929 Other chronic pain: Secondary | ICD-10-CM | POA: Diagnosis not present

## 2020-02-22 DIAGNOSIS — M1711 Unilateral primary osteoarthritis, right knee: Secondary | ICD-10-CM | POA: Diagnosis not present

## 2020-02-25 DIAGNOSIS — Z96642 Presence of left artificial hip joint: Secondary | ICD-10-CM | POA: Diagnosis not present

## 2020-02-25 DIAGNOSIS — M1711 Unilateral primary osteoarthritis, right knee: Secondary | ICD-10-CM | POA: Diagnosis not present

## 2020-02-25 DIAGNOSIS — G8929 Other chronic pain: Secondary | ICD-10-CM | POA: Diagnosis not present

## 2020-02-25 DIAGNOSIS — Z471 Aftercare following joint replacement surgery: Secondary | ICD-10-CM | POA: Diagnosis not present

## 2020-02-25 DIAGNOSIS — M5136 Other intervertebral disc degeneration, lumbar region: Secondary | ICD-10-CM | POA: Diagnosis not present

## 2020-02-25 DIAGNOSIS — G629 Polyneuropathy, unspecified: Secondary | ICD-10-CM | POA: Diagnosis not present

## 2020-02-29 DIAGNOSIS — G8929 Other chronic pain: Secondary | ICD-10-CM | POA: Diagnosis not present

## 2020-02-29 DIAGNOSIS — Z96642 Presence of left artificial hip joint: Secondary | ICD-10-CM | POA: Diagnosis not present

## 2020-02-29 DIAGNOSIS — M1711 Unilateral primary osteoarthritis, right knee: Secondary | ICD-10-CM | POA: Diagnosis not present

## 2020-02-29 DIAGNOSIS — G629 Polyneuropathy, unspecified: Secondary | ICD-10-CM | POA: Diagnosis not present

## 2020-02-29 DIAGNOSIS — M5136 Other intervertebral disc degeneration, lumbar region: Secondary | ICD-10-CM | POA: Diagnosis not present

## 2020-02-29 DIAGNOSIS — Z471 Aftercare following joint replacement surgery: Secondary | ICD-10-CM | POA: Diagnosis not present

## 2020-03-02 ENCOUNTER — Ambulatory Visit
Admission: RE | Admit: 2020-03-02 | Discharge: 2020-03-02 | Disposition: A | Discharge status: Home / Self Care | Payer: Medicare Other | Source: Ambulatory Visit | Attending: Orthopedic Surgery | Admitting: Orthopedic Surgery

## 2020-03-02 DIAGNOSIS — M25511 Pain in right shoulder: Secondary | ICD-10-CM

## 2020-03-02 DIAGNOSIS — M25711 Osteophyte, right shoulder: Secondary | ICD-10-CM | POA: Diagnosis not present

## 2020-03-02 DIAGNOSIS — I7090 Unspecified atherosclerosis: Secondary | ICD-10-CM | POA: Diagnosis not present

## 2020-03-02 DIAGNOSIS — M7551 Bursitis of right shoulder: Secondary | ICD-10-CM | POA: Diagnosis not present

## 2020-03-02 DIAGNOSIS — Z01818 Encounter for other preprocedural examination: Secondary | ICD-10-CM | POA: Diagnosis not present

## 2020-03-03 DIAGNOSIS — M1711 Unilateral primary osteoarthritis, right knee: Secondary | ICD-10-CM | POA: Diagnosis not present

## 2020-03-03 DIAGNOSIS — M5136 Other intervertebral disc degeneration, lumbar region: Secondary | ICD-10-CM | POA: Diagnosis not present

## 2020-03-03 DIAGNOSIS — Z96642 Presence of left artificial hip joint: Secondary | ICD-10-CM | POA: Diagnosis not present

## 2020-03-03 DIAGNOSIS — G629 Polyneuropathy, unspecified: Secondary | ICD-10-CM | POA: Diagnosis not present

## 2020-03-03 DIAGNOSIS — Z471 Aftercare following joint replacement surgery: Secondary | ICD-10-CM | POA: Diagnosis not present

## 2020-03-03 DIAGNOSIS — G8929 Other chronic pain: Secondary | ICD-10-CM | POA: Diagnosis not present

## 2020-03-22 DIAGNOSIS — M25511 Pain in right shoulder: Secondary | ICD-10-CM | POA: Diagnosis not present

## 2020-03-22 DIAGNOSIS — M25512 Pain in left shoulder: Secondary | ICD-10-CM | POA: Diagnosis not present

## 2020-03-23 DIAGNOSIS — E782 Mixed hyperlipidemia: Secondary | ICD-10-CM | POA: Diagnosis not present

## 2020-03-23 DIAGNOSIS — G2581 Restless legs syndrome: Secondary | ICD-10-CM | POA: Diagnosis not present

## 2020-03-23 DIAGNOSIS — Z9189 Other specified personal risk factors, not elsewhere classified: Secondary | ICD-10-CM | POA: Diagnosis not present

## 2020-03-23 DIAGNOSIS — I10 Essential (primary) hypertension: Secondary | ICD-10-CM | POA: Diagnosis not present

## 2020-03-23 DIAGNOSIS — R5383 Other fatigue: Secondary | ICD-10-CM | POA: Diagnosis not present

## 2020-03-23 DIAGNOSIS — R739 Hyperglycemia, unspecified: Secondary | ICD-10-CM | POA: Diagnosis not present

## 2020-03-23 DIAGNOSIS — J449 Chronic obstructive pulmonary disease, unspecified: Secondary | ICD-10-CM | POA: Diagnosis not present

## 2020-03-23 DIAGNOSIS — B351 Tinea unguium: Secondary | ICD-10-CM | POA: Diagnosis not present

## 2020-03-30 DIAGNOSIS — M19012 Primary osteoarthritis, left shoulder: Secondary | ICD-10-CM | POA: Diagnosis not present

## 2020-03-30 DIAGNOSIS — E782 Mixed hyperlipidemia: Secondary | ICD-10-CM | POA: Diagnosis not present

## 2020-03-30 DIAGNOSIS — J449 Chronic obstructive pulmonary disease, unspecified: Secondary | ICD-10-CM | POA: Diagnosis not present

## 2020-03-30 DIAGNOSIS — Z0001 Encounter for general adult medical examination with abnormal findings: Secondary | ICD-10-CM | POA: Diagnosis not present

## 2020-03-30 DIAGNOSIS — I10 Essential (primary) hypertension: Secondary | ICD-10-CM | POA: Diagnosis not present

## 2020-03-30 DIAGNOSIS — R4582 Worries: Secondary | ICD-10-CM | POA: Diagnosis not present

## 2020-03-30 DIAGNOSIS — G2581 Restless legs syndrome: Secondary | ICD-10-CM | POA: Diagnosis not present

## 2020-03-30 DIAGNOSIS — M19011 Primary osteoarthritis, right shoulder: Secondary | ICD-10-CM | POA: Diagnosis not present

## 2020-04-05 NOTE — Progress Notes (Signed)
DUE TO COVID-19 ONLY ONE VISITOR IS ALLOWED TO COME WITH YOU AND STAY IN THE WAITING ROOM ONLY DURING PRE OP AND PROCEDURE DAY OF SURGERY. THE 1 VISITOR  MAY VISIT WITH YOU AFTER SURGERY IN YOUR PRIVATE ROOM DURING VISITING HOURS ONLY!  YOU NEED TO HAVE A COVID 19 TEST ON_10/01/2020 ______ @_______ , THIS TEST MUST BE DONE BEFORE SURGERY,  COVID TESTING SITE [address] [place] [address], IT IS ON THE RIGHT GOING OUT [address] APPROXIMATELY  2 MINUTES PAST [employer] ON THE RIGHT. ONCE YOUR COVID TEST IS COMPLETED,  PLEASE BEGIN THE QUARANTINE INSTRUCTIONS AS OUTLINED IN YOUR HANDOUT.                Jessica Serrano  04/05/2020   Your procedure is scheduled on: 04/13/2020    Report to [facility] Main  Entrance   Report to admitting at    0730am AM     Call this number if you have problems the morning of surgery [phone]    REMEMBER: NO  SOLID FOOD CANDY OR GUM AFTER MIDNIGHT. CLEAR LIQUIDS UNTIL   0700am        . NOTHING BY MOUTH EXCEPT CLEAR LIQUIDS UNTIL    . PLEASE FINISH ENSURE DRINK PER SURGEON ORDER  WHICH NEEDS TO BE COMPLETED AT   0700am   .      CLEAR LIQUID DIET   Foods Allowed                                                                    Coffee and tea, regular and decaf                            Fruit ices (not with fruit pulp)                                      Iced Popsicles                                    Carbonated beverages, regular and diet                                    Cranberry, grape and apple juices Sports drinks like Gatorade Lightly seasoned clear broth or consume(fat free) Sugar, honey syrup ___________________________________________________________________      BRUSH YOUR TEETH MORNING OF SURGERY AND RINSE YOUR MOUTH OUT, NO CHEWING GUM CANDY OR MINTS.     Take these medicines the morning of surgery with A SIP OF WATER:  Inhalers as usual and bring, gabapentin, effexor  DO NOT TAKE ANY  DIABETIC MEDICATIONS DAY OF YOUR SURGERY                               You may not have any metal on your body including hair pins and  piercings  Do not wear jewelry, make-up, lotions, powders or perfumes, deodorant             Do not wear nail polish on your fingernails.  Do not shave  48 hours prior to surgery.              Men may shave face and neck.   Do not bring valuables to the hospital. [place].  Contacts, dentures or bridgework may not be worn into surgery.  Leave suitcase in the car. After surgery it may be brought to your room.     Patients discharged the day of surgery will not be allowed to drive home. IF YOU ARE HAVING SURGERY AND GOING HOME THE SAME DAY, YOU MUST HAVE AN ADULT TO DRIVE YOU HOME AND BE WITH YOU FOR 24 HOURS. YOU MAY GO HOME BY TAXI OR UBER OR ORTHERWISE, BUT AN ADULT MUST ACCOMPANY YOU HOME AND STAY WITH YOU FOR 24 HOURS.  Name and phone number of your driver:  Special Instructions: N/A              Please read over the following fact sheets you were given: _____________________________________________________________________  [facility] - Preparing for Surgery Before surgery, you can play an important role.  Because skin is not sterile, your skin needs to be as free of germs as possible.  You can reduce the number of germs on your skin by washing with CHG (chlorahexidine gluconate) soap before surgery.  CHG is an antiseptic cleaner which kills germs and bonds with the skin to continue killing germs even after washing. Please DO NOT use if you have an allergy to CHG or antibacterial soaps.  If your skin becomes reddened/irritated stop using the CHG and inform your nurse when you arrive at Short Stay. Do not shave (including legs and underarms) for at least 48 hours prior to the first CHG shower.  You may shave your face/neck. Please follow these instructions carefully:  1.  Shower with CHG Soap  the night before surgery and the  morning of Surgery.  2.  If you choose to wash your hair, wash your hair first as usual with your  normal  shampoo.  3.  After you shampoo, rinse your hair and body thoroughly to remove the  shampoo.                           4.  Use CHG as you would any other liquid soap.  You can apply chg directly  to the skin and wash                       Gently with a scrungie or clean washcloth.  5.  Apply the CHG Soap to your body ONLY FROM THE NECK DOWN.   Do not use on face/ open                           Wound or open sores. Avoid contact with eyes, ears mouth and genitals (private parts).                       Wash face,  Genitals (private parts) with your normal soap.             6.  Wash thoroughly, paying special attention to the area where your surgery  will be performed.  7.  Thoroughly rinse your body with warm water from the neck down.  8.  DO NOT shower/wash with your normal soap after using and rinsing off  the CHG Soap.                9.  Pat yourself dry with a clean towel.            10.  Wear clean pajamas.            11.  Place clean sheets on your bed the night of your first shower and do not  sleep with pets. Day of Surgery : Do not apply any lotions/deodorants the morning of surgery.  Please wear clean clothes to the hospital/surgery center.  FAILURE TO FOLLOW THESE INSTRUCTIONS MAY RESULT IN THE CANCELLATION OF YOUR SURGERY PATIENT SIGNATURE_________________________________  NURSE SIGNATURE__________________________________  ________________________________________________________________________

## 2020-04-06 ENCOUNTER — Encounter (HOSPITAL_COMMUNITY): Payer: Self-pay

## 2020-04-06 ENCOUNTER — Encounter (HOSPITAL_COMMUNITY)
Admission: RE | Admit: 2020-04-06 | Discharge: 2020-04-06 | Disposition: A | Discharge status: Home / Self Care | Payer: Medicare Other | Source: Ambulatory Visit | Attending: Orthopedic Surgery | Admitting: Orthopedic Surgery

## 2020-04-06 ENCOUNTER — Other Ambulatory Visit: Payer: Self-pay

## 2020-04-06 DIAGNOSIS — Z01818 Encounter for other preprocedural examination: Secondary | ICD-10-CM | POA: Diagnosis not present

## 2020-04-06 LAB — SURGICAL PCR SCREEN
MRSA, PCR: NEGATIVE
Staphylococcus aureus: NEGATIVE

## 2020-04-06 LAB — BASIC METABOLIC PANEL
Anion gap: 11 (ref 5–15)
BUN: 24 mg/dL — ABNORMAL HIGH (ref 8–23)
CO2: 30 mmol/L (ref 22–32)
Calcium: 9.5 mg/dL (ref 8.9–10.3)
Chloride: 98 mmol/L (ref 98–111)
Creatinine, Ser: 0.7 mg/dL (ref 0.44–1.00)
GFR calc Af Amer: >60 mL/min (ref >60)
GFR calc non Af Amer: >60 mL/min (ref >60)
Glucose, Bld: 98 mg/dL (ref 70–99)
Potassium: 4.5 mmol/L (ref 3.5–5.1)
Sodium: 139 mmol/L (ref 135–145)

## 2020-04-06 LAB — CBC
HCT: 45.3 % (ref 36.0–46.0)
Hemoglobin: 14.4 g/dL (ref 12.0–15.0)
MCH: 30.8 pg (ref 26.0–34.0)
MCHC: 31.8 g/dL (ref 30.0–36.0)
MCV: 97 fL (ref 80.0–100.0)
Platelets: 165 10*3/uL (ref 150–400)
RBC: 4.67 MIL/uL (ref 3.87–5.11)
RDW: 13.4 % (ref 11.5–15.5)
WBC: 5.6 10*3/uL (ref 4.0–10.5)
nRBC: 0 % (ref 0.0–0.2)

## 2020-04-06 NOTE — Progress Notes (Signed)
Called and requested LOV note from office of DR Kern Alberta.  Patient states had physical done there last week,  They are to fax.

## 2020-04-07 NOTE — Progress Notes (Signed)
LOV - DR Kern Alberta 03/30/20 on chart.

## 2020-04-10 ENCOUNTER — Other Ambulatory Visit (HOSPITAL_COMMUNITY)
Admission: RE | Admit: 2020-04-10 | Discharge: 2020-04-10 | Disposition: A | Discharge status: Home / Self Care | Payer: Medicare Other | Source: Ambulatory Visit | Attending: Orthopedic Surgery | Admitting: Orthopedic Surgery

## 2020-04-10 DIAGNOSIS — Z01812 Encounter for preprocedural laboratory examination: Secondary | ICD-10-CM | POA: Insufficient documentation

## 2020-04-10 DIAGNOSIS — Z20822 Contact with and (suspected) exposure to covid-19: Secondary | ICD-10-CM | POA: Diagnosis not present

## 2020-04-10 LAB — SARS CORONAVIRUS 2 (TAT 6-24 HRS): SARS Coronavirus 2: NEGATIVE

## 2020-04-12 NOTE — Anesthesia Preprocedure Evaluation (Addendum)
Anesthesia Evaluation  Patient identified by MRN, date of birth, ID band Patient awake    Reviewed: Allergy & Precautions, NPO status , Patient's Chart, lab work & pertinent test results  Airway Mallampati: I  TM Distance: >3 FB Neck ROM: Full    Dental no notable dental hx. (+) Edentulous Upper, Edentulous Lower   Pulmonary COPD, former smoker,    Pulmonary exam normal breath sounds clear to auscultation       Cardiovascular hypertension, Pt. on home beta blockers and Pt. on medications Normal cardiovascular exam Rhythm:Regular Rate:Normal     Neuro/Psych PSYCHIATRIC DISORDERS Depression  Neuromuscular disease    GI/Hepatic Neg liver ROS, GERD  ,  Endo/Other  negative endocrine ROS  Renal/GU negative Renal ROSK+ 4.5 Cr 0.70     Musculoskeletal  (+) Arthritis ,   Abdominal (+) + obese,   Peds  Hematology negative hematology ROS (+) Hgb 14.4   Anesthesia Other Findings NKDA  Reproductive/Obstetrics                            Anesthesia Physical Anesthesia Plan  ASA: III  Anesthesia Plan: General   Post-op Pain Management:  Regional for Post-op pain   Induction:   PONV Risk Score and Plan: 4 or greater and Treatment may vary due to age or medical condition and Ondansetron  Airway Management Planned: Oral ETT  Additional Equipment: None  Intra-op Plan:   Post-operative Plan: Extubation in OR  Informed Consent: I have reviewed the patients History and Physical, chart, labs and discussed the procedure including the risks, benefits and alternatives for the proposed anesthesia with the patient or authorized representative who has indicated his/her understanding and acceptance.     Dental advisory given  Plan Discussed with: CRNA and Anesthesiologist  Anesthesia Plan Comments: (Ga w R ISB w exparel)       Anesthesia Quick Evaluation

## 2020-04-13 ENCOUNTER — Encounter (HOSPITAL_COMMUNITY): Payer: Self-pay | Admitting: Orthopedic Surgery

## 2020-04-13 ENCOUNTER — Ambulatory Visit (HOSPITAL_COMMUNITY)
Admission: RE | Admit: 2020-04-13 | Discharge: 2020-04-13 | Disposition: A | Discharge status: Home / Self Care | Payer: Medicare Other | Attending: Orthopedic Surgery | Admitting: Orthopedic Surgery

## 2020-04-13 ENCOUNTER — Encounter (HOSPITAL_COMMUNITY)
Admission: RE | Disposition: A | Discharge status: Home / Self Care | Payer: Self-pay | Source: Home / Self Care | Attending: Orthopedic Surgery

## 2020-04-13 ENCOUNTER — Ambulatory Visit (HOSPITAL_COMMUNITY): Payer: Medicare Other | Admitting: Physician Assistant

## 2020-04-13 ENCOUNTER — Ambulatory Visit (HOSPITAL_COMMUNITY): Payer: Medicare Other

## 2020-04-13 ENCOUNTER — Ambulatory Visit (HOSPITAL_COMMUNITY): Payer: Medicare Other | Admitting: Anesthesiology

## 2020-04-13 DIAGNOSIS — Z9049 Acquired absence of other specified parts of digestive tract: Secondary | ICD-10-CM | POA: Insufficient documentation

## 2020-04-13 DIAGNOSIS — R030 Elevated blood-pressure reading, without diagnosis of hypertension: Secondary | ICD-10-CM | POA: Insufficient documentation

## 2020-04-13 DIAGNOSIS — G510 Bell's palsy: Secondary | ICD-10-CM | POA: Diagnosis not present

## 2020-04-13 DIAGNOSIS — J449 Chronic obstructive pulmonary disease, unspecified: Secondary | ICD-10-CM | POA: Diagnosis not present

## 2020-04-13 DIAGNOSIS — F329 Major depressive disorder, single episode, unspecified: Secondary | ICD-10-CM | POA: Diagnosis not present

## 2020-04-13 DIAGNOSIS — Z9889 Other specified postprocedural states: Secondary | ICD-10-CM

## 2020-04-13 DIAGNOSIS — Z8249 Family history of ischemic heart disease and other diseases of the circulatory system: Secondary | ICD-10-CM | POA: Diagnosis not present

## 2020-04-13 DIAGNOSIS — G629 Polyneuropathy, unspecified: Secondary | ICD-10-CM | POA: Insufficient documentation

## 2020-04-13 DIAGNOSIS — Z90710 Acquired absence of both cervix and uterus: Secondary | ICD-10-CM | POA: Insufficient documentation

## 2020-04-13 DIAGNOSIS — Z96642 Presence of left artificial hip joint: Secondary | ICD-10-CM | POA: Insufficient documentation

## 2020-04-13 DIAGNOSIS — Z87891 Personal history of nicotine dependence: Secondary | ICD-10-CM | POA: Diagnosis not present

## 2020-04-13 DIAGNOSIS — M21921 Unspecified acquired deformity of right upper arm: Secondary | ICD-10-CM | POA: Insufficient documentation

## 2020-04-13 DIAGNOSIS — Z96611 Presence of right artificial shoulder joint: Secondary | ICD-10-CM | POA: Diagnosis not present

## 2020-04-13 DIAGNOSIS — Z8261 Family history of arthritis: Secondary | ICD-10-CM | POA: Insufficient documentation

## 2020-04-13 DIAGNOSIS — M19011 Primary osteoarthritis, right shoulder: Secondary | ICD-10-CM | POA: Insufficient documentation

## 2020-04-13 DIAGNOSIS — Z791 Long term (current) use of non-steroidal anti-inflammatories (NSAID): Secondary | ICD-10-CM | POA: Diagnosis not present

## 2020-04-13 DIAGNOSIS — Z79899 Other long term (current) drug therapy: Secondary | ICD-10-CM | POA: Insufficient documentation

## 2020-04-13 DIAGNOSIS — Z96652 Presence of left artificial knee joint: Secondary | ICD-10-CM | POA: Insufficient documentation

## 2020-04-13 DIAGNOSIS — G8918 Other acute postprocedural pain: Secondary | ICD-10-CM | POA: Diagnosis not present

## 2020-04-13 DIAGNOSIS — M85811 Other specified disorders of bone density and structure, right shoulder: Secondary | ICD-10-CM | POA: Insufficient documentation

## 2020-04-13 DIAGNOSIS — I10 Essential (primary) hypertension: Secondary | ICD-10-CM | POA: Diagnosis not present

## 2020-04-13 DIAGNOSIS — Z471 Aftercare following joint replacement surgery: Secondary | ICD-10-CM | POA: Diagnosis not present

## 2020-04-13 HISTORY — PX: REVERSE SHOULDER ARTHROPLASTY: SHX5054

## 2020-04-13 SURGERY — ARTHROPLASTY, SHOULDER, TOTAL, REVERSE
Anesthesia: General | Site: Shoulder | Laterality: Right

## 2020-04-13 MED ORDER — LACTATED RINGERS IV SOLN: INTRAVENOUS | Status: DC

## 2020-04-13 MED ORDER — EPHEDRINE SULFATE-NACL 50-0.9 MG/10ML-% IV SOSY
PREFILLED_SYRINGE | INTRAVENOUS | Status: DC | PRN
  Administered 2020-04-13: 10 mg via INTRAVENOUS

## 2020-04-13 MED ORDER — ONDANSETRON HCL 4 MG/2ML IJ SOLN
INTRAMUSCULAR | Status: DC | PRN
  Administered 2020-04-13: 4 mg via INTRAVENOUS

## 2020-04-13 MED ORDER — ORAL CARE MOUTH RINSE: 15.0000 mL | Freq: Once | OROMUCOSAL | Status: AC

## 2020-04-13 MED ORDER — BUPIVACAINE LIPOSOME 1.3 % IJ SUSP
INTRAMUSCULAR | Status: DC | PRN
  Administered 2020-04-13: 10 mL via PERINEURAL

## 2020-04-13 MED ORDER — PROPOFOL 10 MG/ML IV BOLUS
INTRAVENOUS | Status: AC
  Filled 2020-04-13: qty 20

## 2020-04-13 MED ORDER — DEXAMETHASONE SODIUM PHOSPHATE 10 MG/ML IJ SOLN
INTRAMUSCULAR | Status: AC
  Filled 2020-04-13: qty 1

## 2020-04-13 MED ORDER — ONDANSETRON HCL 4 MG/2ML IJ SOLN: 4.0000 mg | Freq: Four times a day (QID) | INTRAMUSCULAR | Status: DC | PRN

## 2020-04-13 MED ORDER — LIDOCAINE 2% (20 MG/ML) 5 ML SYRINGE
INTRAMUSCULAR | Status: AC
  Filled 2020-04-13: qty 5

## 2020-04-13 MED ORDER — ONDANSETRON HCL 4 MG PO TABS: 4.0000 mg | ORAL_TABLET | Freq: Three times a day (TID) | ORAL | 0 refills | Status: DC | PRN

## 2020-04-13 MED ORDER — STERILE WATER FOR IRRIGATION IR SOLN
Status: DC | PRN
  Administered 2020-04-13: 2000 mL

## 2020-04-13 MED ORDER — ROCURONIUM BROMIDE 10 MG/ML (PF) SYRINGE
PREFILLED_SYRINGE | INTRAVENOUS | Status: AC
  Filled 2020-04-13: qty 10

## 2020-04-13 MED ORDER — ONDANSETRON HCL 4 MG/2ML IJ SOLN: 4.0000 mg | Freq: Once | INTRAMUSCULAR | Status: DC | PRN

## 2020-04-13 MED ORDER — LACTATED RINGERS IV BOLUS: 250.0000 mL | Freq: Once | INTRAVENOUS | Status: DC

## 2020-04-13 MED ORDER — ROCURONIUM BROMIDE 10 MG/ML (PF) SYRINGE
PREFILLED_SYRINGE | INTRAVENOUS | Status: DC | PRN
  Administered 2020-04-13: 50 mg via INTRAVENOUS

## 2020-04-13 MED ORDER — FENTANYL CITRATE (PF) 100 MCG/2ML IJ SOLN
INTRAMUSCULAR | Status: AC
  Administered 2020-04-13: 25 ug via INTRAVENOUS
  Filled 2020-04-13: qty 2

## 2020-04-13 MED ORDER — FENTANYL CITRATE (PF) 100 MCG/2ML IJ SOLN
INTRAMUSCULAR | Status: AC
  Filled 2020-04-13: qty 2

## 2020-04-13 MED ORDER — LACTATED RINGERS IV BOLUS
500.0000 mL | Freq: Once | INTRAVENOUS | Status: AC
  Administered 2020-04-13: 500 mL via INTRAVENOUS

## 2020-04-13 MED ORDER — PHENYLEPHRINE 40 MCG/ML (10ML) SYRINGE FOR IV PUSH (FOR BLOOD PRESSURE SUPPORT)
PREFILLED_SYRINGE | INTRAVENOUS | Status: AC
  Filled 2020-04-13: qty 10

## 2020-04-13 MED ORDER — FENTANYL CITRATE (PF) 100 MCG/2ML IJ SOLN
50.0000 ug | Freq: Once | INTRAMUSCULAR | Status: AC
  Administered 2020-04-13: 25 ug via INTRAVENOUS
  Filled 2020-04-13: qty 2

## 2020-04-13 MED ORDER — MIDAZOLAM HCL 2 MG/2ML IJ SOLN
1.0000 mg | Freq: Once | INTRAMUSCULAR | Status: DC
  Filled 2020-04-13: qty 2

## 2020-04-13 MED ORDER — METOCLOPRAMIDE HCL 5 MG/ML IJ SOLN: 5.0000 mg | Freq: Three times a day (TID) | INTRAMUSCULAR | Status: DC | PRN

## 2020-04-13 MED ORDER — PHENYLEPHRINE HCL (PRESSORS) 10 MG/ML IV SOLN
INTRAVENOUS | Status: AC
  Filled 2020-04-13: qty 1

## 2020-04-13 MED ORDER — PHENYLEPHRINE HCL-NACL 10-0.9 MG/250ML-% IV SOLN
INTRAVENOUS | Status: DC | PRN
  Administered 2020-04-13: 40 ug/min via INTRAVENOUS

## 2020-04-13 MED ORDER — BUPIVACAINE-EPINEPHRINE (PF) 0.5% -1:200000 IJ SOLN
INTRAMUSCULAR | Status: DC | PRN
  Administered 2020-04-13: 15 mL via PERINEURAL

## 2020-04-13 MED ORDER — LIDOCAINE HCL (CARDIAC) PF 100 MG/5ML IV SOSY
PREFILLED_SYRINGE | INTRAVENOUS | Status: DC | PRN
  Administered 2020-04-13: 100 mg via INTRAVENOUS

## 2020-04-13 MED ORDER — TRANEXAMIC ACID-NACL 1000-0.7 MG/100ML-% IV SOLN
1000.0000 mg | INTRAVENOUS | Status: AC
  Administered 2020-04-13: 1000 mg via INTRAVENOUS
  Filled 2020-04-13: qty 100

## 2020-04-13 MED ORDER — FENTANYL CITRATE (PF) 100 MCG/2ML IJ SOLN
INTRAMUSCULAR | Status: DC | PRN
  Administered 2020-04-13: 50 ug via INTRAVENOUS

## 2020-04-13 MED ORDER — HYDROMORPHONE HCL 2 MG PO TABS: 2.0000 mg | ORAL_TABLET | ORAL | 0 refills | Status: DC | PRN

## 2020-04-13 MED ORDER — 0.9 % SODIUM CHLORIDE (POUR BTL) OPTIME
TOPICAL | Status: DC | PRN
  Administered 2020-04-13: 1000 mL

## 2020-04-13 MED ORDER — ACETAMINOPHEN 10 MG/ML IV SOLN
1000.0000 mg | Freq: Once | INTRAVENOUS | Status: DC | PRN
  Administered 2020-04-13: 1000 mg via INTRAVENOUS

## 2020-04-13 MED ORDER — ONDANSETRON HCL 4 MG PO TABS
4.0000 mg | ORAL_TABLET | Freq: Four times a day (QID) | ORAL | Status: DC | PRN
  Filled 2020-04-13: qty 1

## 2020-04-13 MED ORDER — FENTANYL CITRATE (PF) 100 MCG/2ML IJ SOLN
25.0000 ug | INTRAMUSCULAR | Status: DC | PRN
  Administered 2020-04-13: 50 ug via INTRAVENOUS

## 2020-04-13 MED ORDER — ACETAMINOPHEN 10 MG/ML IV SOLN
INTRAVENOUS | Status: AC
  Filled 2020-04-13: qty 100

## 2020-04-13 MED ORDER — CHLORHEXIDINE GLUCONATE 0.12 % MT SOLN
15.0000 mL | Freq: Once | OROMUCOSAL | Status: AC
  Administered 2020-04-13: 15 mL via OROMUCOSAL

## 2020-04-13 MED ORDER — PHENYLEPHRINE 40 MCG/ML (10ML) SYRINGE FOR IV PUSH (FOR BLOOD PRESSURE SUPPORT)
PREFILLED_SYRINGE | INTRAVENOUS | Status: DC | PRN
  Administered 2020-04-13: 200 ug via INTRAVENOUS

## 2020-04-13 MED ORDER — ONDANSETRON HCL 4 MG/2ML IJ SOLN
INTRAMUSCULAR | Status: AC
  Filled 2020-04-13: qty 2

## 2020-04-13 MED ORDER — METOCLOPRAMIDE HCL 5 MG PO TABS
5.0000 mg | ORAL_TABLET | Freq: Three times a day (TID) | ORAL | Status: DC | PRN
  Filled 2020-04-13: qty 2

## 2020-04-13 MED ORDER — SUGAMMADEX SODIUM 200 MG/2ML IV SOLN
INTRAVENOUS | Status: DC | PRN
  Administered 2020-04-13: 200 mg via INTRAVENOUS

## 2020-04-13 MED ORDER — DEXAMETHASONE SODIUM PHOSPHATE 10 MG/ML IJ SOLN
INTRAMUSCULAR | Status: DC | PRN
  Administered 2020-04-13: 5 mg via INTRAVENOUS

## 2020-04-13 MED ORDER — PROPOFOL 10 MG/ML IV BOLUS
INTRAVENOUS | Status: DC | PRN
  Administered 2020-04-13: 120 mg via INTRAVENOUS

## 2020-04-13 MED ORDER — CEFAZOLIN SODIUM-DEXTROSE 2-4 GM/100ML-% IV SOLN
2.0000 g | INTRAVENOUS | Status: AC
  Administered 2020-04-13: 2 g via INTRAVENOUS
  Filled 2020-04-13: qty 100

## 2020-04-13 SURGICAL SUPPLY — 84 items
ADH SKN CLS APL DERMABOND .7 (GAUZE/BANDAGES/DRESSINGS) ×1
AID PSTN UNV HD RSTRNT DISP (MISCELLANEOUS) ×1
BAG SPEC THK2 15X12 ZIP CLS (MISCELLANEOUS) ×1
BAG ZIPLOCK 12X15 (MISCELLANEOUS) ×3 IMPLANT
BASEPLATE AUG FULL 24X20 +2 (Plate) ×2 IMPLANT
BASEPLATE MOD 24+4 LAT (Plate) ×2 IMPLANT
BLADE SAW SGTL 83.5X18.5 (BLADE) ×3 IMPLANT
BSPLAT GLND +4X24 MDLR STRL (Plate) ×1 IMPLANT
COOLER ICEMAN CLASSIC (MISCELLANEOUS) ×2 IMPLANT
COVER BACK TABLE 60X90IN (DRAPES) ×3 IMPLANT
COVER SURGICAL LIGHT HANDLE (MISCELLANEOUS) ×3 IMPLANT
COVER WAND RF STERILE (DRAPES) IMPLANT
CUP SUT UNIV REVERS 36 NEUTRAL (Cup) ×2 IMPLANT
DERMABOND ADVANCED (GAUZE/BANDAGES/DRESSINGS) ×2
DERMABOND ADVANCED .7 DNX12 (GAUZE/BANDAGES/DRESSINGS) ×1 IMPLANT
DRAPE INCISE IOBAN 66X45 STRL (DRAPES) IMPLANT
DRAPE ORTHO SPLIT 77X108 STRL (DRAPES) ×6
DRAPE SHEET LG 3/4 BI-LAMINATE (DRAPES) ×3 IMPLANT
DRAPE SURG 17X11 SM STRL (DRAPES) ×3 IMPLANT
DRAPE SURG ORHT 6 SPLT 77X108 (DRAPES) ×2 IMPLANT
DRAPE U-SHAPE 47X51 STRL (DRAPES) ×3 IMPLANT
DRESSING AQUACEL AG SP 3.5X10 (GAUZE/BANDAGES/DRESSINGS) IMPLANT
DRSG AQUACEL AG ADV 3.5X10 (GAUZE/BANDAGES/DRESSINGS) ×3 IMPLANT
DRSG AQUACEL AG SP 3.5X10 (GAUZE/BANDAGES/DRESSINGS) ×3
DURAPREP 26ML APPLICATOR (WOUND CARE) ×3 IMPLANT
ELECT BLADE TIP CTD 4 INCH (ELECTRODE) ×3 IMPLANT
ELECT REM PT RETURN 15FT ADLT (MISCELLANEOUS) ×3 IMPLANT
FACESHIELD WRAPAROUND (MASK) ×12 IMPLANT
FACESHIELD WRAPAROUND OR TEAM (MASK) ×4 IMPLANT
GLENOSPHERE 36 +4 LAT/24 (Joint) ×2 IMPLANT
GLOVE BIO SURGEON STRL SZ7.5 (GLOVE) ×3 IMPLANT
GLOVE BIO SURGEON STRL SZ8 (GLOVE) ×3 IMPLANT
GLOVE SS BIOGEL STRL SZ 7 (GLOVE) ×1 IMPLANT
GLOVE SS BIOGEL STRL SZ 7.5 (GLOVE) ×1 IMPLANT
GLOVE SUPERSENSE BIOGEL SZ 7 (GLOVE) ×2
GLOVE SUPERSENSE BIOGEL SZ 7.5 (GLOVE) ×2
GLOVE SURG SYN 7.0 (GLOVE) IMPLANT
GLOVE SURG SYN 7.0 PF PI (GLOVE) IMPLANT
GLOVE SURG SYN 7.5  E (GLOVE)
GLOVE SURG SYN 7.5 E (GLOVE) IMPLANT
GLOVE SURG SYN 7.5 PF PI (GLOVE) IMPLANT
GLOVE SURG SYN 8.0 (GLOVE) IMPLANT
GLOVE SURG SYN 8.0 PF PI (GLOVE) IMPLANT
GOWN STRL REUS W/TWL LRG LVL3 (GOWN DISPOSABLE) ×6 IMPLANT
KIT BASIN OR (CUSTOM PROCEDURE TRAY) ×3 IMPLANT
KIT TURNOVER KIT A (KITS) IMPLANT
LINER HUMERAL 36 +3MM SM (Shoulder) ×2 IMPLANT
MANIFOLD NEPTUNE II (INSTRUMENTS) ×3 IMPLANT
NDL TAPERED W/ NITINOL LOOP (MISCELLANEOUS) ×1 IMPLANT
NEEDLE TAPERED W/ NITINOL LOOP (MISCELLANEOUS) ×3 IMPLANT
NS IRRIG 1000ML POUR BTL (IV SOLUTION) ×3 IMPLANT
PACK SHOULDER (CUSTOM PROCEDURE TRAY) ×3 IMPLANT
PAD ARMBOARD 7.5X6 YLW CONV (MISCELLANEOUS) ×3 IMPLANT
PAD COLD SHLDR WRAP-ON (PAD) ×2 IMPLANT
PIN NITINOL TARGETER 2.8 (PIN) IMPLANT
PIN SET MODULAR GLENOID SYSTEM (PIN) ×2 IMPLANT
POST MODULAR 25 (Post) ×3 IMPLANT
POST MODULAR MGS BASEPLATE 25 (Post) IMPLANT
REAMER ANGLED HEAD SMALL (DRILL) ×2 IMPLANT
RESTRAINT HEAD UNIVERSAL NS (MISCELLANEOUS) ×3 IMPLANT
SCREW CENTRAL MODULAR 20 (Screw) ×2 IMPLANT
SCREW PERI LOCK 5.5X36 (Screw) ×2 IMPLANT
SCREW PERIPHERAL 5.5X20 LOCK (Screw) ×2 IMPLANT
SCREW PERIPHERAL NL 4.5X20 (Screw) ×2 IMPLANT
SLING ARM FOAM STRAP LRG (SOFTGOODS) IMPLANT
SLING ARM FOAM STRAP MED (SOFTGOODS) IMPLANT
SPACER SHLD UNI REV 36+9 (Spacer) ×1 IMPLANT
SPACER UNIVERS REVERS 36+9MM (Spacer) ×1 IMPLANT
SPONGE LAP 18X18 RF (DISPOSABLE) ×2 IMPLANT
STEM HUMERAL UNIVER REV SIZE 7 (Stem) ×2 IMPLANT
SUCTION FRAZIER HANDLE 12FR (TUBING) ×3
SUCTION TUBE FRAZIER 12FR DISP (TUBING) ×1 IMPLANT
SUT FIBERTAPE CERCLAGE 2 48 (SUTURE) ×2 IMPLANT
SUT FIBERWIRE #2 38 T-5 BLUE (SUTURE)
SUT MNCRL AB 3-0 PS2 18 (SUTURE) ×3 IMPLANT
SUT MON AB 2-0 CT1 36 (SUTURE) ×3 IMPLANT
SUT TIGER TAPE 7 IN WHITE (SUTURE) ×2 IMPLANT
SUT VIC AB 1 CT1 36 (SUTURE) ×3 IMPLANT
SUTURE FIBERWR #2 38 T-5 BLUE (SUTURE) IMPLANT
SUTURE TAPE 1.3 40 TPR END (SUTURE) ×2 IMPLANT
SUTURETAPE 1.3 40 TPR END (SUTURE) ×6
TOWEL OR 17X26 10 PK STRL BLUE (TOWEL DISPOSABLE) ×3 IMPLANT
TOWEL OR NON WOVEN STRL DISP B (DISPOSABLE) ×3 IMPLANT
WATER STERILE IRR 1000ML POUR (IV SOLUTION) ×6 IMPLANT

## 2020-04-13 NOTE — Progress Notes (Signed)
AssistedDr. Houser with right, ultrasound guided, interscalene  block. Side rails up, monitors on throughout procedure. See vital signs in flow sheet. Tolerated Procedure well.  

## 2020-04-13 NOTE — Op Note (Signed)
04/13/2020  11:59 AM  PATIENT:   Jessica Serrano  79 y.o. 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  ASSISTANTS: Jenetta Loges, PA-C  ANESTHESIA:   General endotracheal and interscalene block with Exparel  EBL: 200 cc  SPECIMEN: None  Drains: None   PATIENT DISPOSITION:  PACU - hemodynamically stable.    PLAN OF CARE: Discharge to home after PACU  Brief history:  Patient is a 79 year old female who has had chronic and progressively increasing right shoulder pain with clinical exam demonstrating profoundly restricted mobility and global weakness.  Her plain radiographs demonstrate marked bony erosion of the glenoid with anterior subluxation of the humeral head.  Due to her increasing pain and functional mentation she is brought to the operating this time for planned right shoulder reverse arthroplasty.  Preoperatively, I counseled the patient regarding treatment options and risks versus benefits thereof.  Possible surgical complications were all reviewed including potential for bleeding, infection, neurovascular injury, persistent pain, loss of motion, anesthetic complication, failure of the implant, and possible need for additional surgery. They understand and accept and agrees with our planned procedure.   Procedure in detail:  After undergoing routine preop evaluation the patient received prophylactic antibiotics and interscalene block with Exparel was established in the holding area by the anesthesia department.  Patient subsequently placed supine on the operating table and underwent the smooth induction of a general endotracheal anesthesia.  Placed into the beachchair  position and appropriately padded and protected.  The right shoulder girdle region was sterilely prepped and draped in standard fashion.  Timeout was called.  An anterior deltopectoral approach to the right shoulder is made through a 12 cm incision.  Skin flaps elevated dissection carried deeply and we explored the underlying deltopectoral interval.  There was no dominant vein superficially and she did have significant deformity related to the erosion of the humeral head medially and anteriorly.  Ultimately the deltopectoral interval was identified based on position of the coracoid and careful dissection allowed [place] to develop this interval from proximal to distal.  Of note there was significant scarring of the soft tissue planes and marked soft tissue deformity.  Ultimately gained access beneath the deltoid and dissected distally identified the conjoined tendon which was then mobilized and retracted medially.  We found the remnant of the biceps tendon to help orient the dissection and then carefully dissected the remnant of the subscapularis from the lesser tuberosity which allowed [place] to obtain mobilization of the shoulder.  There were large osteophytes anteriorly and inferiorly.  Capsular attachments on the anterior and infra margins of the humeral neck were then divided in a subperiosteal fashion.  This allowed [place] to deliver the humeral head through the wound which showed marked deformity and erosion.  We outlined our proposed humeral head resection with the extra medullary guide and a saw was then used to resect the humeral head at approximate 20 degrees retroversion.  Osteophytes were then removed from the margins of the humeral neck and a metal cap was placed at the cut proximal humeral surface.  We then gained access to the glenoid and there was significant osteophytic spurring around the margins and in the soft tissues of the capsule which were carefully dissected free  and then performed a circumferential  labral resection.  We identified a significant abnormality in the version of the glenoid and we carefully compared the view intraoperative to the CT scan that we had obtained preoperatively and the operative view showed significantly more anteversion than the preoperative CT scan would have led to believe.  We initially felt we the use  place a neutral glenoid baseplate but after an initial reaming of the bone surface became clear that there was severe anteversion and deformity.  With this in mind we then converted to the use of a 20 degree augment and we then  directed a guidepin which then gained access down the scapular body very nicely.  We for pleased with the reamed surface we achieved.  Our guidepin was then used to create a tunnel for the 25 mm post.  The glenoid baseplate was then assembled and was impacted into position with excellent fit and fixation.  We placed an initial nonlocking screw in a posterior inferior screw hole with good purchase.  We then filled the anterior superior and posterior superior screw holes with locking screws both of which obtained excellent purchase and fixation.  The anterior inferior drill hole did not have adequate bone for use of the screws.  We are very pleased with the stability of this construct and then placed a 36/+4 glenosphere onto the baseplate this was then impacted and a central locking screw was placed.  We then returned our attention back to the metaphysis where we hand reamed the canal and then broached ultimately up to size 7.  We used a neutral metaphyseal reamer.  We did note the beginnings of a unicortical deficiency in the medial calcar and so we prophylactically placed a cerclage tape to protect the metaphysis.  Once this was placed we performed a series of trial reductions and confirmed appropriate soft tissue balance.  Good stability and good motion.  Our final implant was then assembled and was impacted into position with good fit and fixation.  Our  cerclage tape provided excellent support proximally.  We then performed a series of trial reductions and ultimately felt that the +9 spacer with a +3 polygave [place] the best soft tissue balance.  Spacer and polywear then applied and a final reduction was performed showing good motion good stability good soft tissue balance.  A terminal tightening of the cerclage tape was then completed and the tape was then tied and cut.  This point the wound was copes irrigated.  Hemostasis was obtained.  The deltopectoral interval was closed with a series of figure-of-eight and 1 Vicryl sutures.  2-0 Vicryl used to close the subcu layer and intracuticular 3-0 Monocryl for the skin followed by Dermabond and Aquacel dressing.  Right arm was then placed in a sling and the patient was awakened, extubated, and taken to recovery room in stable condition.  Jenetta Loges, PA-C was utilized as an Environmental consultant throughout this case, essential for help with positioning the patient, positioning extremity, tissue manipulation, implantation of the prosthesis, suture management, wound closure, and intraoperative decision-making.  Marin Shutter MD   Contact # [phone]

## 2020-04-13 NOTE — Transfer of Care (Signed)
Immediate Anesthesia Transfer of Care Note  Patient: [place]  Procedure(s) Performed: REVERSE SHOULDER ARTHROPLASTY (Right Shoulder)  Patient Location: PACU  Anesthesia Type:GA combined with regional for post-op pain  Level of Consciousness: awake, alert , oriented, drowsy and patient cooperative  Airway & Oxygen Therapy: Patient Spontanous Breathing and Patient connected to nasal cannula oxygen  Post-op Assessment: Report given to RN and Post -op Vital signs reviewed and stable  Post vital signs: Reviewed and stable  Last Vitals:  Vitals Value Taken Time  BP 138/58 04/13/20 1212  Temp    Pulse 65 04/13/20 1216  Resp 21 04/13/20 1216  SpO2 100 % 04/13/20 1216  Vitals shown include unvalidated device data.  Last Pain:  Vitals:   04/13/20 0806  TempSrc:   PainSc: 8          Complications: No complications documented.

## 2020-04-13 NOTE — H&P (Signed)
Jessica Serrano    Chief Complaint: Right shoulder advanced osteoarthritis HPI: The patient is a 79 y.o. female with chronic and progressively increasing bilateral shoulder pain, right significantly worse than the left, with radiographs confirming severe bony deformity and joint erosion related to osteoarthritis.  She is brought to the operating this time for planned right shoulder reverse arthroplasty on the right.  Past Medical History:  Diagnosis Date  . Bell's palsy    right  . COPD (chronic obstructive pulmonary disease) (HCC)    mild  . Decrease in appetite 09/25/2015  . Depression   . Diarrhea 09/25/2015  . Dyspnea    with exertion   . Hemochromatosis   . High blood pressure   . History of vaginal bleeding 09/05/2015  . LLQ pain 09/25/2015  . Neuropathy   . Osteoarthritis    knees  . Vaginal atrophy 09/05/2015  . Weight loss 09/25/2015    Past Surgical History:  Procedure Laterality Date  . ABDOMINAL HYSTERECTOMY    . APPENDECTOMY    . BACK SURGERY  2018   screws and cage in lower back  . BREAST SURGERY  08/2015   biopsy left breast  . CHOLECYSTECTOMY     1998  . ELBOW SURGERY    . ESOPHAGOGASTRODUODENOSCOPY N/A 03/18/2014   Procedure: ESOPHAGOGASTRODUODENOSCOPY (EGD);  Surgeon: Rogene Houston, MD;  Location: [facility];  Service: Endoscopy;  Laterality: N/A;  730  . EYE SURGERY Bilateral 2014,2012  . GALLBLADDER SURGERY    . JOINT REPLACEMENT Left   . KNEE SURGERY Left   . lap chole stones    . MALONEY DILATION N/A 03/18/2014   Procedure: Venia Minks DILATION;  Surgeon: Rogene Houston, MD;  Location: [facility] SUITE;  Service: Endoscopy;  Laterality: N/A;  . SHOULDER SURGERY    . TONSILLECTOMY AND ADENOIDECTOMY    . TOTAL HIP ARTHROPLASTY Left 12/08/2019   Procedure: TOTAL HIP ARTHROPLASTY ANTERIOR APPROACH;  Surgeon: Gaynelle Arabian, MD;  Location: [facility] ORS;  Service: Orthopedics;  Laterality: Left;  187min  . TOTAL KNEE ARTHROPLASTY     1998 left knee  . TRIGGER  FINGER RELEASE      Family History  Problem Relation Age of Onset  . Stroke Other   . Stomach cancer Other   . Heart Problems Other   . Arthritis Other     Social History:  reports that she quit smoking about 29 years ago. Her smoking use included cigarettes. She has a 126.00 pack-year smoking history. She has never used smokeless tobacco. She reports that she does not drink alcohol and does not use drugs.   Medications Prior to Admission  Medication Sig Dispense Refill  . acetaminophen (TYLENOL) 325 MG tablet Take 3 tablets (975 mg total) by mouth every 8 (eight) hours as needed for mild pain (pain score 1-3 or temp > 100.5). (Patient taking differently: Take 650 mg by mouth every 8 (eight) hours as needed for mild pain (pain score 1-3 or temp > 100.5). )    . atenolol (TENORMIN) 50 MG tablet Take 50 mg by mouth at bedtime.     Marland Kitchen atorvastatin (LIPITOR) 20 MG tablet Take 20 mg by mouth daily.    . baclofen (LIORESAL) 10 MG tablet Take 10 mg by mouth 3 (three) times daily.    Marland Kitchen docusate sodium (COLACE) 100 MG capsule Take 100 mg by mouth at bedtime as needed for mild constipation.     . furosemide (LASIX) 40 MG tablet Take 40  mg by mouth 2 (two) times daily.    Marland Kitchen gabapentin (NEURONTIN) 600 MG tablet Take 600-1,200 mg by mouth See admin instructions. 600 mg in the morning, and 1200 mg at bedtime    . losartan (COZAAR) 25 MG tablet Take 25 mg by mouth daily.    . meloxicam (MOBIC) 15 MG tablet Take 15 mg by mouth daily. Last dose 03/29/20    . Oxycodone HCl 10 MG TABS Take 20 mg by mouth 4 (four) times daily as needed (Pain). 4 times daily - 6-06-13-11    . rOPINIRole (REQUIP) 1 MG tablet Take 1 mg by mouth 2 (two) times daily.     . temazepam (RESTORIL) 15 MG capsule Take 30 mg by mouth at bedtime.     Marland Kitchen venlafaxine (EFFEXOR) 75 MG tablet Take 75 mg by mouth 2 (two) times daily.       Physical Exam: Right shoulder demonstrates severely restricted and painful motion as noted at her recent  office visits.  Her strength is globally decreased secondary to pain and guarding.  She is otherwise neurovascular intact in the right upper extremity.  Radiographs  Plain films of the right shoulder demonstrate complete obliteration of the joint space with significant bony erosion.  A preoperative CT scan confirms the severe joint destruction but relatively neutral glenoid version.  Attempts at utilizing the CT scan for preoperative templating were not successful due to the degree of bony deformity and joint erosion.  Vitals  Temp:  [98.5 F (36.9 C)] 98.5 F (36.9 C) (10/07 0758) Pulse Rate:  [54-73] 73 (10/07 0858) Resp:  [9-20] 17 (10/07 0858) BP: (138-170)/(63-90) 163/63 (10/07 0857) SpO2:  [90 %-100 %] 100 % (10/07 0858) Weight:  [100.4 kg] 100.4 kg (10/07 0806)  Assessment/Plan  Impression: Right shoulder advanced osteoarthritis  Plan of Action: Procedure(s): REVERSE SHOULDER ARTHROPLASTY  Jessica Serrano 04/13/2020, 9:14 AM Contact # [phone]

## 2020-04-13 NOTE — Evaluation (Signed)
Occupational Therapy Evaluation Patient Details Name: Jessica Serrano MRN: [id number] DOB: 13-Aug-1940 Today's Date: 04/13/2020    History of Present Illness Hx  of L THR, L TKR, back surgery, Bell's Palsy, COPD and presents today for R reverse TSA.   Clinical Impression   Jessica Serrano is a 79 year old woman s/p reverse shoulder replacement without functional use of right dominant upper extremity secondary to effects of surgery, interscalene block, and shoulder precautions. Therapist provided education and instruction to patient and daughter in regards to exercises, precautions, positioning, donning upper extremity clothing and bathing while maintaining shoulder precautions, ice and edema management and donning/doffing sling. Patient and daugher verbalized understanding. Patient needed assistance to donn shirt, underwear, pants, socks and shoes which is her baseline. Patient typically ambulated short distances with RW. Patient encouraged to use cane and have assistance of family. Patient's daughter reports she will only be getting up to ambulate short distance to [facility]. Patient to follow up with MD for further therapy needs.      Follow Up Recommendations  Follow surgeon's recommendation for DC plan and follow-up therapies    Equipment Recommendations  None recommended by OT    Recommendations for Other Services       Precautions / Restrictions Precautions Precautions: Shoulder Shoulder Interventions: Shoulder sling/immobilizer;At all times;Off for dressing/bathing/exercises Precaution Booklet Issued: Yes (comment) Required Braces or Orthoses: Sling Restrictions Weight Bearing Restrictions: Yes RUE Weight Bearing: Non weight bearing      Mobility Bed Mobility                  Transfers Overall transfer level: Needs assistance               General transfer comment: Patient typically uses RW for short ambulation. Patient required min assist for sit to  stands. Patient and daughter report she has a cane at home she can use.    Balance Overall balance assessment: Mild deficits observed, not formally tested                                         ADL either performed or assessed with clinical judgement   ADL Overall ADL's : Needs assistance/impaired Eating/Feeding: Set up   Grooming: Set up;Sitting   Upper Body Bathing: Minimal assistance;Sitting;Set up   Lower Body Bathing: Maximal assistance;Sit to/from stand;Set up   Upper Body Dressing : Maximal assistance;Sitting   Lower Body Dressing: Maximal assistance;Set up;Sit to/from stand   Toilet Transfer: Minimal assistance;BSC   Toileting- Clothing Manipulation and Hygiene: Maximal assistance;Sit to/from stand     Tub/Shower Transfer Details (indicate cue type and reason): will be performing sponge baths.         Vision Baseline Vision/History: Wears glasses Wears Glasses: At all times       Perception     Praxis      Pertinent Vitals/Pain Pain Assessment: No/denies pain     Hand Dominance Right   Extremity/Trunk Assessment Upper Extremity Assessment Upper Extremity Assessment: RUE deficits/detail RUE Deficits / Details: Able to open and close fingers, grossly flex/extend wrist.           Communication Communication Communication: HOH   Cognition Arousal/Alertness: Awake/alert Behavior During Therapy: WFL for tasks assessed/performed Overall Cognitive Status: Within Functional Limits for tasks assessed  General Comments       Exercises     Shoulder Instructions Shoulder Instructions Donning/doffing shirt without moving shoulder: Caregiver independent with task Method for sponge bathing under operated UE: Caregiver independent with task Donning/doffing sling/immobilizer: Caregiver independent with task Correct positioning of sling/immobilizer: Caregiver independent with  task Pendulum exercises (written home exercise program): Caregiver independent with task ROM for elbow, wrist and digits of operated UE: Caregiver independent with task Sling wearing schedule (on at all times/off for ADL's): Caregiver independent with task Proper positioning of operated UE when showering: Caregiver independent with task Dressing change: Caregiver independent with task Positioning of UE while sleeping: Caregiver independent with task    Home Living Family/patient expects to be discharged to:: Private residence Living Arrangements: Children Available Help at Discharge: Family (Daughter will be home for first several days then patient will have intermittent assistance throughout the day.) Type of Home: House                       Home Equipment: Bedside commode;Cane - single point;Walker - 2 wheels          Prior Functioning/Environment Level of Independence: Needs assistance  Gait / Transfers Assistance Needed: Very ltd ambulation with RW ADL's / Homemaking Assistance Needed: Assistance with bathing and dressing.            OT Problem List: Decreased strength;Decreased range of motion;Impaired UE functional use;Obesity;Impaired balance (sitting and/or standing)      OT Treatment/Interventions:      OT Goals(Current goals can be found in the care plan section) Acute Rehab OT Goals Patient Stated Goal: did not state OT Goal Formulation: All assessment and education complete, DC therapy  OT Frequency:     Barriers to D/C:            Co-evaluation              AM-PAC OT "6 Clicks" Daily Activity     Outcome Measure Help from another person eating meals?: A Little Help from another person taking care of personal grooming?: A Little Help from another person toileting, which includes using toliet, bedpan, or urinal?: A Lot Help from another person bathing (including washing, rinsing, drying)?: A Lot Help from another person to put on and  taking off regular upper body clothing?: A Lot Help from another person to put on and taking off regular lower body clothing?: A Lot 6 Click Score: 14   End of Session Nurse Communication:  (okay to see per Rn)  Activity Tolerance: Patient tolerated treatment well Patient left: in chair  OT Visit Diagnosis: Muscle weakness (generalized) (M62.81)                Time: 8676-7209 OT Time Calculation (min): 34 min Charges:  OT General Charges $OT Visit: 1 Visit OT Evaluation $OT Eval Low Complexity: 1 Low OT Treatments $Self Care/Home Management : 8-22 mins  Aesha Agrawal, OTR/L [place]  Office [phone] Pager: [phone]   Lenward Chancellor 04/13/2020, 3:53 PM

## 2020-04-13 NOTE — Anesthesia Postprocedure Evaluation (Signed)
Anesthesia Post Note  Patient: Jessica Serrano  Procedure(s) Performed: REVERSE SHOULDER ARTHROPLASTY (Right Shoulder)     Patient location during evaluation: PACU Anesthesia Type: General Level of consciousness: awake and alert Pain management: pain level controlled Vital Signs Assessment: post-procedure vital signs reviewed and stable Respiratory status: spontaneous breathing, nonlabored ventilation, respiratory function stable and patient connected to nasal cannula oxygen Cardiovascular status: blood pressure returned to baseline and stable Postop Assessment: no apparent nausea or vomiting Anesthetic complications: no   No complications documented.  Last Vitals:  Vitals:   04/13/20 1400 04/13/20 1430  BP: (!) 168/58 (!) 170/78  Pulse: (!) 57 63  Resp: 13 16  Temp:    SpO2: 92% 95%    Last Pain:  Vitals:   04/13/20 1430  TempSrc:   PainSc: 0-No pain                 Barnet Glasgow

## 2020-04-13 NOTE — Discharge Instructions (Signed)
 Kevin M. Supple, M.D., F.A.A.O.S. Orthopaedic Surgery Specializing in Arthroscopic and Reconstructive Surgery of the Shoulder [phone] [address] - [place], [place] [address] - Fax [phone]   POST-OP TOTAL SHOULDER REPLACEMENT INSTRUCTIONS  1. Follow up in the office for your first post-op appointment 10-14 days from the date of your surgery. If you do not already have a scheduled appointment, our office will contact you to schedule.  2. The bandage over your incision is waterproof. You may begin showering with this dressing on. You may leave this dressing on until first follow up appointment within 2 weeks. We prefer you leave this dressing in place until follow up however after 5-7 days if you are having itching or skin irritation and would like to remove it you may do so. Go slow and tug at the borders gently to break the bond the dressing has with the skin. At this point if there is no drainage it is okay to go without a bandage or you may cover it with a light guaze and tape. You can also expect significant bruising around your shoulder that will drift down your arm and into your chest wall. This is very normal and should resolve over several days.   3. Wear your sling/immobilizer at all times except to perform the exercises below or to occasionally let your arm dangle by your side to stretch your elbow. You also need to sleep in your sling immobilizer until instructed otherwise. It is ok to remove your sling if you are sitting in a controlled environment and allow your arm to rest in a position of comfort by your side or on your lap with pillows to give your neck and skin a break from the sling. You may remove it to allow arm to dangle by side to shower. If you are up walking around and when you go to sleep at night you need to wear it.  4. Range of motion to your elbow, wrist, and hand are encouraged 3-5 times daily. Exercise to your hand and fingers helps to reduce  swelling you may experience.   5. Prescriptions for a pain medication and a muscle relaxant are provided for you. It is recommended that if you are experiencing pain that you pain medication alone is not controlling, add the muscle relaxant along with the pain medication which can give additional pain relief. The first 1-2 days is generally the most severe of your pain and then should gradually decrease. As your pain lessens it is recommended that you decrease your use of the pain medications to an "as needed basis'" only and to always comply with the recommended dosages of the pain medications.  6. Pain medications can produce constipation along with their use. If you experience this, the use of an over the counter stool softener or laxative daily is recommended.   7. For additional questions or concerns, please do not hesitate to call the office. If after hours there is an answering service to forward your concerns to the physician on call.  8.Pain control following an exparel block  To help control your post-operative pain you received a nerve block  performed with Exparel which is a long acting anesthetic (numbing agent) which can provide pain relief and sensations of numbness (and relief of pain) in the operative shoulder and arm for up to 3 days. Sometimes it provides mixed relief, meaning you may still have numbness in certain areas of the arm but can still be able to   move  parts of that arm, hand, and fingers. We recommend that your prescribed pain medications  be used as needed. We do not feel it is necessary to "pre medicate" and "stay ahead" of pain.  Taking narcotic pain medications when you are not having any pain can lead to unnecessary and potentially dangerous side effects.    9. Use the ice machine as much as possible in the first 5-7 days from surgery, then you can wean its use to as needed. The ice typically needs to be replaced every 6 hours, instead of ice you can actually freeze  water bottles to put in the cooler and then fill water around them to avoid having to purchase ice. You can have spare water bottles freezing to allow you to rotate them once they have melted. Try to have a thin shirt or light cloth or towel under the ice wrap to protect your skin.   10.  We recommend that you avoid any dental work or cleaning in the first 3 months following your joint replacement. This is to help minimize the possibility of infection from the bacteria in your mouth that enters your bloodstream during dental work. We also recommend that you take an antibiotic prior to your dental work for the first year after your shoulder replacement to further help reduce that risk. Please simply contact our office for antibiotics to be sent to your pharmacy prior to dental work.  11. Dental Antibiotics:  In most cases prophylactic antibiotics for Dental procdeures after total joint surgery are not necessary.  Exceptions are as follows:  1. History of prior total joint infection  2. Severely immunocompromised (Organ Transplant, cancer chemotherapy, Rheumatoid biologic meds such as Humera)  3. Poorly controlled diabetes (A1C &gt; 8.0, blood glucose over 200)  If you have one of these conditions, contact your surgeon for an antibiotic prescription, prior to your dental procedure.   POST-OP EXERCISES  Pendulum Exercises  Perform pendulum exercises while standing and bending at the waist. Support your uninvolved arm on a table or chair and allow your operated arm to hang freely. Make sure to do these exercises passively - not using you shoulder muscles. These exercises can be performed once your nerve block effects have worn off.  Repeat 20 times. Do 3 sessions per day.     

## 2020-04-13 NOTE — Anesthesia Procedure Notes (Addendum)
Anesthesia Regional Block: Interscalene brachial plexus block   Pre-Anesthetic Checklist: ,, timeout performed, Correct Patient, Correct Site, Correct Laterality, Correct Procedure, Correct Position, site marked, Risks and benefits discussed,  Surgical consent,  Pre-op evaluation,  At surgeon's request and post-op pain management  Laterality: Upper and Right  Prep: Maximum Sterile Barrier Precautions used, chloraprep       Needles:  Injection technique: Single-shot  Needle Type: Echogenic Needle     Needle Length: 5cm  Needle Gauge: 21     Additional Needles:   Procedures:,,,, ultrasound used (permanent image in chart),,,,  Narrative:  Start time: 04/13/2020 8:46 AM End time: 04/13/2020 8:53 AM Injection made incrementally with aspirations every 5 mL.  Performed by: Personally  Anesthesiologist: Barnet Glasgow, MD  Additional Notes: Block assessed prior to procedure. Patient tolerated procedure well.

## 2020-04-13 NOTE — Anesthesia Procedure Notes (Signed)
Procedure Name: Intubation Date/Time: 04/13/2020 9:58 AM Performed by: Raenette Rover, CRNA Pre-anesthesia Checklist: Patient identified, Emergency Drugs available, Suction available and Patient being monitored Patient Re-evaluated:Patient Re-evaluated prior to induction Oxygen Delivery Method: Circle system utilized Preoxygenation: Pre-oxygenation with 100% oxygen Induction Type: IV induction Ventilation: Mask ventilation without difficulty Laryngoscope Size: Mac and 3 Grade View: Grade I Tube type: Oral Tube size: 7.0 mm Number of attempts: 1 Airway Equipment and Method: Stylet Placement Confirmation: ETT inserted through vocal cords under direct vision,  positive ETCO2 and breath sounds checked- equal and bilateral Secured at: 22 cm Tube secured with: Tape Dental Injury: Teeth and Oropharynx as per pre-operative assessment

## 2020-04-17 ENCOUNTER — Encounter (HOSPITAL_COMMUNITY): Payer: Self-pay | Admitting: Orthopedic Surgery

## 2020-04-26 DIAGNOSIS — Z96611 Presence of right artificial shoulder joint: Secondary | ICD-10-CM | POA: Diagnosis not present

## 2020-04-26 DIAGNOSIS — Z471 Aftercare following joint replacement surgery: Secondary | ICD-10-CM | POA: Diagnosis not present

## 2020-04-28 DIAGNOSIS — Z96611 Presence of right artificial shoulder joint: Secondary | ICD-10-CM | POA: Diagnosis not present

## 2020-04-28 DIAGNOSIS — Z471 Aftercare following joint replacement surgery: Secondary | ICD-10-CM | POA: Diagnosis not present

## 2020-04-28 DIAGNOSIS — M25511 Pain in right shoulder: Secondary | ICD-10-CM | POA: Diagnosis not present

## 2020-05-01 DIAGNOSIS — M25511 Pain in right shoulder: Secondary | ICD-10-CM | POA: Diagnosis not present

## 2020-05-01 DIAGNOSIS — Z96611 Presence of right artificial shoulder joint: Secondary | ICD-10-CM | POA: Diagnosis not present

## 2020-05-01 DIAGNOSIS — Z471 Aftercare following joint replacement surgery: Secondary | ICD-10-CM | POA: Diagnosis not present

## 2020-05-03 DIAGNOSIS — M25511 Pain in right shoulder: Secondary | ICD-10-CM | POA: Diagnosis not present

## 2020-05-03 DIAGNOSIS — Z471 Aftercare following joint replacement surgery: Secondary | ICD-10-CM | POA: Diagnosis not present

## 2020-05-03 DIAGNOSIS — Z96611 Presence of right artificial shoulder joint: Secondary | ICD-10-CM | POA: Diagnosis not present

## 2020-05-08 DIAGNOSIS — Z471 Aftercare following joint replacement surgery: Secondary | ICD-10-CM | POA: Diagnosis not present

## 2020-05-08 DIAGNOSIS — M25511 Pain in right shoulder: Secondary | ICD-10-CM | POA: Diagnosis not present

## 2020-05-08 DIAGNOSIS — Z96611 Presence of right artificial shoulder joint: Secondary | ICD-10-CM | POA: Diagnosis not present

## 2020-05-10 DIAGNOSIS — M25511 Pain in right shoulder: Secondary | ICD-10-CM | POA: Diagnosis not present

## 2020-05-10 DIAGNOSIS — Z96611 Presence of right artificial shoulder joint: Secondary | ICD-10-CM | POA: Diagnosis not present

## 2020-05-10 DIAGNOSIS — Z471 Aftercare following joint replacement surgery: Secondary | ICD-10-CM | POA: Diagnosis not present

## 2020-05-16 DIAGNOSIS — Z96611 Presence of right artificial shoulder joint: Secondary | ICD-10-CM | POA: Diagnosis not present

## 2020-05-16 DIAGNOSIS — M25511 Pain in right shoulder: Secondary | ICD-10-CM | POA: Diagnosis not present

## 2020-05-16 DIAGNOSIS — Z471 Aftercare following joint replacement surgery: Secondary | ICD-10-CM | POA: Diagnosis not present

## 2020-05-18 DIAGNOSIS — Z471 Aftercare following joint replacement surgery: Secondary | ICD-10-CM | POA: Diagnosis not present

## 2020-05-18 DIAGNOSIS — M25511 Pain in right shoulder: Secondary | ICD-10-CM | POA: Diagnosis not present

## 2020-05-18 DIAGNOSIS — Z96611 Presence of right artificial shoulder joint: Secondary | ICD-10-CM | POA: Diagnosis not present

## 2020-05-18 DIAGNOSIS — Z23 Encounter for immunization: Secondary | ICD-10-CM | POA: Diagnosis not present

## 2020-05-23 DIAGNOSIS — Z96611 Presence of right artificial shoulder joint: Secondary | ICD-10-CM | POA: Diagnosis not present

## 2020-05-23 DIAGNOSIS — M25511 Pain in right shoulder: Secondary | ICD-10-CM | POA: Diagnosis not present

## 2020-05-23 DIAGNOSIS — Z471 Aftercare following joint replacement surgery: Secondary | ICD-10-CM | POA: Diagnosis not present

## 2020-05-25 DIAGNOSIS — M25511 Pain in right shoulder: Secondary | ICD-10-CM | POA: Diagnosis not present

## 2020-05-25 DIAGNOSIS — Z96611 Presence of right artificial shoulder joint: Secondary | ICD-10-CM | POA: Diagnosis not present

## 2020-05-25 DIAGNOSIS — Z471 Aftercare following joint replacement surgery: Secondary | ICD-10-CM | POA: Diagnosis not present

## 2020-05-29 DIAGNOSIS — Z96611 Presence of right artificial shoulder joint: Secondary | ICD-10-CM | POA: Diagnosis not present

## 2020-05-29 DIAGNOSIS — M25511 Pain in right shoulder: Secondary | ICD-10-CM | POA: Diagnosis not present

## 2020-05-29 DIAGNOSIS — Z471 Aftercare following joint replacement surgery: Secondary | ICD-10-CM | POA: Diagnosis not present

## 2020-05-31 DIAGNOSIS — M25511 Pain in right shoulder: Secondary | ICD-10-CM | POA: Diagnosis not present

## 2020-05-31 DIAGNOSIS — Z471 Aftercare following joint replacement surgery: Secondary | ICD-10-CM | POA: Diagnosis not present

## 2020-05-31 DIAGNOSIS — Z96611 Presence of right artificial shoulder joint: Secondary | ICD-10-CM | POA: Diagnosis not present

## 2020-06-05 DIAGNOSIS — M25511 Pain in right shoulder: Secondary | ICD-10-CM | POA: Diagnosis not present

## 2020-06-05 DIAGNOSIS — Z471 Aftercare following joint replacement surgery: Secondary | ICD-10-CM | POA: Diagnosis not present

## 2020-06-05 DIAGNOSIS — Z96611 Presence of right artificial shoulder joint: Secondary | ICD-10-CM | POA: Diagnosis not present

## 2020-06-08 DIAGNOSIS — M25511 Pain in right shoulder: Secondary | ICD-10-CM | POA: Diagnosis not present

## 2020-06-08 DIAGNOSIS — Z96611 Presence of right artificial shoulder joint: Secondary | ICD-10-CM | POA: Diagnosis not present

## 2020-06-08 DIAGNOSIS — Z471 Aftercare following joint replacement surgery: Secondary | ICD-10-CM | POA: Diagnosis not present

## 2020-06-12 DIAGNOSIS — Z96611 Presence of right artificial shoulder joint: Secondary | ICD-10-CM | POA: Diagnosis not present

## 2020-06-12 DIAGNOSIS — M25511 Pain in right shoulder: Secondary | ICD-10-CM | POA: Diagnosis not present

## 2020-06-12 DIAGNOSIS — Z471 Aftercare following joint replacement surgery: Secondary | ICD-10-CM | POA: Diagnosis not present

## 2020-06-15 DIAGNOSIS — Z471 Aftercare following joint replacement surgery: Secondary | ICD-10-CM | POA: Diagnosis not present

## 2020-06-15 DIAGNOSIS — M25511 Pain in right shoulder: Secondary | ICD-10-CM | POA: Diagnosis not present

## 2020-06-15 DIAGNOSIS — Z96611 Presence of right artificial shoulder joint: Secondary | ICD-10-CM | POA: Diagnosis not present

## 2020-06-16 DIAGNOSIS — R059 Cough, unspecified: Secondary | ICD-10-CM | POA: Diagnosis not present

## 2020-06-16 DIAGNOSIS — Z20828 Contact with and (suspected) exposure to other viral communicable diseases: Secondary | ICD-10-CM | POA: Diagnosis not present

## 2020-06-20 DIAGNOSIS — U071 COVID-19: Secondary | ICD-10-CM | POA: Diagnosis not present

## 2020-06-20 DIAGNOSIS — Z23 Encounter for immunization: Secondary | ICD-10-CM | POA: Diagnosis not present

## 2020-06-21 DIAGNOSIS — Z96611 Presence of right artificial shoulder joint: Secondary | ICD-10-CM | POA: Diagnosis not present

## 2020-06-21 DIAGNOSIS — M25511 Pain in right shoulder: Secondary | ICD-10-CM | POA: Diagnosis not present

## 2020-06-21 DIAGNOSIS — Z471 Aftercare following joint replacement surgery: Secondary | ICD-10-CM | POA: Diagnosis not present

## 2020-06-26 DIAGNOSIS — Z471 Aftercare following joint replacement surgery: Secondary | ICD-10-CM | POA: Diagnosis not present

## 2020-06-26 DIAGNOSIS — Z96611 Presence of right artificial shoulder joint: Secondary | ICD-10-CM | POA: Diagnosis not present

## 2020-06-26 DIAGNOSIS — M25511 Pain in right shoulder: Secondary | ICD-10-CM | POA: Diagnosis not present

## 2020-06-28 DIAGNOSIS — Z96611 Presence of right artificial shoulder joint: Secondary | ICD-10-CM | POA: Diagnosis not present

## 2020-06-28 DIAGNOSIS — Z471 Aftercare following joint replacement surgery: Secondary | ICD-10-CM | POA: Diagnosis not present

## 2020-06-28 DIAGNOSIS — M25511 Pain in right shoulder: Secondary | ICD-10-CM | POA: Diagnosis not present

## 2020-07-03 DIAGNOSIS — M25511 Pain in right shoulder: Secondary | ICD-10-CM | POA: Diagnosis not present

## 2020-07-03 DIAGNOSIS — Z96611 Presence of right artificial shoulder joint: Secondary | ICD-10-CM | POA: Diagnosis not present

## 2020-07-03 DIAGNOSIS — Z471 Aftercare following joint replacement surgery: Secondary | ICD-10-CM | POA: Diagnosis not present

## 2020-07-05 DIAGNOSIS — Z471 Aftercare following joint replacement surgery: Secondary | ICD-10-CM | POA: Diagnosis not present

## 2020-07-05 DIAGNOSIS — Z96611 Presence of right artificial shoulder joint: Secondary | ICD-10-CM | POA: Diagnosis not present

## 2020-07-06 DIAGNOSIS — Z96611 Presence of right artificial shoulder joint: Secondary | ICD-10-CM | POA: Diagnosis not present

## 2020-07-06 DIAGNOSIS — M25511 Pain in right shoulder: Secondary | ICD-10-CM | POA: Diagnosis not present

## 2020-07-06 DIAGNOSIS — Z471 Aftercare following joint replacement surgery: Secondary | ICD-10-CM | POA: Diagnosis not present

## 2020-07-10 DIAGNOSIS — Z471 Aftercare following joint replacement surgery: Secondary | ICD-10-CM | POA: Diagnosis not present

## 2020-07-10 DIAGNOSIS — Z96611 Presence of right artificial shoulder joint: Secondary | ICD-10-CM | POA: Diagnosis not present

## 2020-07-10 DIAGNOSIS — M25511 Pain in right shoulder: Secondary | ICD-10-CM | POA: Diagnosis not present

## 2020-07-12 DIAGNOSIS — Z96611 Presence of right artificial shoulder joint: Secondary | ICD-10-CM | POA: Diagnosis not present

## 2020-07-12 DIAGNOSIS — M25511 Pain in right shoulder: Secondary | ICD-10-CM | POA: Diagnosis not present

## 2020-07-12 DIAGNOSIS — Z471 Aftercare following joint replacement surgery: Secondary | ICD-10-CM | POA: Diagnosis not present

## 2020-07-17 DIAGNOSIS — Z471 Aftercare following joint replacement surgery: Secondary | ICD-10-CM | POA: Diagnosis not present

## 2020-07-17 DIAGNOSIS — M25511 Pain in right shoulder: Secondary | ICD-10-CM | POA: Diagnosis not present

## 2020-07-17 DIAGNOSIS — Z96611 Presence of right artificial shoulder joint: Secondary | ICD-10-CM | POA: Diagnosis not present

## 2020-07-19 DIAGNOSIS — Z96611 Presence of right artificial shoulder joint: Secondary | ICD-10-CM | POA: Diagnosis not present

## 2020-07-19 DIAGNOSIS — M25511 Pain in right shoulder: Secondary | ICD-10-CM | POA: Diagnosis not present

## 2020-07-19 DIAGNOSIS — Z471 Aftercare following joint replacement surgery: Secondary | ICD-10-CM | POA: Diagnosis not present

## 2020-07-24 DIAGNOSIS — Z96611 Presence of right artificial shoulder joint: Secondary | ICD-10-CM | POA: Diagnosis not present

## 2020-07-24 DIAGNOSIS — M25511 Pain in right shoulder: Secondary | ICD-10-CM | POA: Diagnosis not present

## 2020-07-24 DIAGNOSIS — Z471 Aftercare following joint replacement surgery: Secondary | ICD-10-CM | POA: Diagnosis not present

## 2020-07-26 DIAGNOSIS — Z96611 Presence of right artificial shoulder joint: Secondary | ICD-10-CM | POA: Diagnosis not present

## 2020-07-26 DIAGNOSIS — Z471 Aftercare following joint replacement surgery: Secondary | ICD-10-CM | POA: Diagnosis not present

## 2020-07-26 DIAGNOSIS — M25511 Pain in right shoulder: Secondary | ICD-10-CM | POA: Diagnosis not present

## 2020-07-27 DIAGNOSIS — I10 Essential (primary) hypertension: Secondary | ICD-10-CM | POA: Diagnosis not present

## 2020-07-27 DIAGNOSIS — R4582 Worries: Secondary | ICD-10-CM | POA: Diagnosis not present

## 2020-07-27 DIAGNOSIS — E7849 Other hyperlipidemia: Secondary | ICD-10-CM | POA: Diagnosis not present

## 2020-07-27 DIAGNOSIS — M1712 Unilateral primary osteoarthritis, left knee: Secondary | ICD-10-CM | POA: Diagnosis not present

## 2020-07-27 DIAGNOSIS — M1711 Unilateral primary osteoarthritis, right knee: Secondary | ICD-10-CM | POA: Diagnosis not present

## 2020-07-27 DIAGNOSIS — J449 Chronic obstructive pulmonary disease, unspecified: Secondary | ICD-10-CM | POA: Diagnosis not present

## 2020-07-27 DIAGNOSIS — G2581 Restless legs syndrome: Secondary | ICD-10-CM | POA: Diagnosis not present

## 2020-07-27 DIAGNOSIS — R42 Dizziness and giddiness: Secondary | ICD-10-CM | POA: Diagnosis not present

## 2020-07-31 DIAGNOSIS — Z96611 Presence of right artificial shoulder joint: Secondary | ICD-10-CM | POA: Diagnosis not present

## 2020-07-31 DIAGNOSIS — Z471 Aftercare following joint replacement surgery: Secondary | ICD-10-CM | POA: Diagnosis not present

## 2020-07-31 DIAGNOSIS — M25511 Pain in right shoulder: Secondary | ICD-10-CM | POA: Diagnosis not present

## 2020-08-02 DIAGNOSIS — M25511 Pain in right shoulder: Secondary | ICD-10-CM | POA: Diagnosis not present

## 2020-08-02 DIAGNOSIS — Z471 Aftercare following joint replacement surgery: Secondary | ICD-10-CM | POA: Diagnosis not present

## 2020-08-02 DIAGNOSIS — Z96611 Presence of right artificial shoulder joint: Secondary | ICD-10-CM | POA: Diagnosis not present

## 2020-10-25 DIAGNOSIS — Z1329 Encounter for screening for other suspected endocrine disorder: Secondary | ICD-10-CM | POA: Diagnosis not present

## 2020-10-25 DIAGNOSIS — E782 Mixed hyperlipidemia: Secondary | ICD-10-CM | POA: Diagnosis not present

## 2020-10-25 DIAGNOSIS — J449 Chronic obstructive pulmonary disease, unspecified: Secondary | ICD-10-CM | POA: Diagnosis not present

## 2020-10-25 DIAGNOSIS — E7849 Other hyperlipidemia: Secondary | ICD-10-CM | POA: Diagnosis not present

## 2020-10-25 DIAGNOSIS — R739 Hyperglycemia, unspecified: Secondary | ICD-10-CM | POA: Diagnosis not present

## 2020-10-25 DIAGNOSIS — I10 Essential (primary) hypertension: Secondary | ICD-10-CM | POA: Diagnosis not present

## 2020-10-26 DIAGNOSIS — G2581 Restless legs syndrome: Secondary | ICD-10-CM | POA: Diagnosis not present

## 2020-10-26 DIAGNOSIS — E7849 Other hyperlipidemia: Secondary | ICD-10-CM | POA: Diagnosis not present

## 2020-10-26 DIAGNOSIS — J449 Chronic obstructive pulmonary disease, unspecified: Secondary | ICD-10-CM | POA: Diagnosis not present

## 2020-10-26 DIAGNOSIS — M19012 Primary osteoarthritis, left shoulder: Secondary | ICD-10-CM | POA: Diagnosis not present

## 2020-10-26 DIAGNOSIS — M19011 Primary osteoarthritis, right shoulder: Secondary | ICD-10-CM | POA: Diagnosis not present

## 2020-10-26 DIAGNOSIS — R4582 Worries: Secondary | ICD-10-CM | POA: Diagnosis not present

## 2020-10-26 DIAGNOSIS — G4700 Insomnia, unspecified: Secondary | ICD-10-CM | POA: Diagnosis not present

## 2020-10-26 DIAGNOSIS — I10 Essential (primary) hypertension: Secondary | ICD-10-CM | POA: Diagnosis not present

## 2020-11-22 DIAGNOSIS — Z96611 Presence of right artificial shoulder joint: Secondary | ICD-10-CM | POA: Diagnosis not present

## 2020-11-22 DIAGNOSIS — M19012 Primary osteoarthritis, left shoulder: Secondary | ICD-10-CM | POA: Diagnosis not present

## 2020-12-06 NOTE — Patient Instructions (Addendum)
DUE TO COVID-19 ONLY ONE VISITOR IS ALLOWED TO COME WITH YOU AND STAY IN THE WAITING ROOM ONLY DURING PRE OP AND PROCEDURE DAY OF SURGERY. THE 1 VISITOR  MAY VISIT WITH YOU AFTER SURGERY IN YOUR PRIVATE ROOM DURING VISITING HOURS ONLY!  YOU NEED TO HAVE A COVID 19 TEST ON: 12/11/20 @ 1:00 PM , THIS TEST MUST BE DONE BEFORE SURGERY,  COVID TESTING SITE [place]  [id number], IT IS ON THE RIGHT GOING OUT [address] APPROXIMATELY  2 MINUTES PAST [employer] ON THE RIGHT. ONCE YOUR COVID TEST IS COMPLETED,  PLEASE BEGIN THE QUARANTINE INSTRUCTIONS AS OUTLINED IN YOUR HANDOUT.                [place] [facility]    Your procedure is scheduled on: 12/14/20   Report to [facility] Main  Entrance   Report to short stay at: 5:15 AM     Call this number if you have problems the morning of surgery [phone]    Remember: NO SOLID FOOD AFTER MIDNIGHT THE NIGHT PRIOR TO SURGERY. NOTHING BY MOUTH EXCEPT CLEAR LIQUIDS UNTIL: 4:30 AM . PLEASE FINISH ENSURE DRINK PER SURGEON ORDER  WHICH NEEDS TO BE COMPLETED AT: 4:30 AM .  CLEAR LIQUID DIET  Foods Allowed                                                                     Foods Excluded  Coffee and tea, regular and decaf                             liquids that you cannot  Plain Jell-O any favor except red or purple                                           see through such as: Fruit ices (not with fruit pulp)                                     milk, soups, orange juice  Iced Popsicles                                    All solid food Carbonated beverages, regular and diet                                    Cranberry, grape and apple juices Sports drinks like Gatorade Lightly seasoned clear broth or consume(fat free) Sugar, honey syrup  Sample Menu Breakfast                                Lunch  Supper Cranberry juice                    Beef broth                             Chicken broth Jell-O                                     Grape juice                           Apple juice Coffee or tea                        Jell-O                                      Popsicle                                                Coffee or tea                        Coffee or tea  _____________________________________________________________________  BRUSH YOUR TEETH MORNING OF SURGERY AND RINSE YOUR MOUTH OUT, NO CHEWING GUM CANDY OR MINTS.    Take these medicines the morning of surgery with A SIP OF WATER: atenolol,gabapentin,ropinirole.                               You may not have any metal on your body including hair pins and              piercings  Do not wear jewelry, make-up, lotions, powders or perfumes, deodorant             Do not wear nail polish on your fingernails.  Do not shave  48 hours prior to surgery.    Do not bring valuables to the hospital. [place].  Contacts, dentures or bridgework may not be worn into surgery.  Leave suitcase in the car. After surgery it may be brought to your room.     Patients discharged the day of surgery will not be allowed to drive home. IF YOU ARE HAVING SURGERY AND GOING HOME THE SAME DAY, YOU MUST HAVE AN ADULT TO DRIVE YOU HOME AND BE WITH YOU FOR 24 HOURS. YOU MAY GO HOME BY TAXI OR UBER OR ORTHERWISE, BUT AN ADULT MUST ACCOMPANY YOU HOME AND STAY WITH YOU FOR 24 HOURS.  Name and phone number of your driver:  Special Instructions: N/A              Please read over the following fact sheets you were given: _____________________________________________________________________         [facility] - Preparing for Surgery Before surgery, you can play an important role.  Because skin is not sterile, your skin needs to be as free of germs as possible.  You can reduce  the number of germs on your skin by washing with CHG (chlorahexidine gluconate) soap before surgery.  CHG is an  antiseptic cleaner which kills germs and bonds with the skin to continue killing germs even after washing. Please DO NOT use if you have an allergy to CHG or antibacterial soaps.  If your skin becomes reddened/irritated stop using the CHG and inform your nurse when you arrive at Short Stay. Do not shave (including legs and underarms) for at least 48 hours prior to the first CHG shower.  You may shave your face/neck. Please follow these instructions carefully:  1.  Shower with CHG Soap the night before surgery and the  morning of Surgery.  2.  If you choose to wash your hair, wash your hair first as usual with your  normal  shampoo.  3.  After you shampoo, rinse your hair and body thoroughly to remove the  shampoo.                           4.  Use CHG as you would any other liquid soap.  You can apply chg directly  to the skin and wash                       Gently with a scrungie or clean washcloth.  5.  Apply the CHG Soap to your body ONLY FROM THE NECK DOWN.   Do not use on face/ open                           Wound or open sores. Avoid contact with eyes, ears mouth and genitals (private parts).                       Wash face,  Genitals (private parts) with your normal soap.             6.  Wash thoroughly, paying special attention to the area where your surgery  will be performed.  7.  Thoroughly rinse your body with warm water from the neck down.  8.  DO NOT shower/wash with your normal soap after using and rinsing off  the CHG Soap.                9.  Pat yourself dry with a clean towel.            10.  Wear clean pajamas.            11.  Place clean sheets on your bed the night of your first shower and do not  sleep with pets. Day of Surgery : Do not apply any lotions/deodorants the morning of surgery.  Please wear clean clothes to the hospital/surgery center.  FAILURE TO FOLLOW THESE INSTRUCTIONS MAY RESULT IN THE CANCELLATION OF YOUR SURGERY PATIENT  SIGNATURE_________________________________  NURSE SIGNATURE__________________________________  ________________________________________________________________________ [facility]- Preparing for Total Shoulder Arthroplasty    Before surgery, you can play an important role. Because skin is not sterile, your skin needs to be as free of germs as possible. You can reduce the number of germs on your skin by using the following products. . Benzoyl Peroxide Gel o Reduces the number of germs present on the skin o Applied twice a day to shoulder area starting two days before surgery    ==================================================================  Please follow these instructions carefully:  BENZOYL PEROXIDE 5% GEL  Please do not use if you have an allergy to benzoyl peroxide.   If your skin becomes reddened/irritated stop using the benzoyl peroxide.  Starting two days before surgery, apply as follows: 1. Apply benzoyl peroxide in the morning and at night. Apply after taking a shower. If you are not taking a shower clean entire shoulder front, back, and side along with the armpit with a clean wet washcloth.  2. Place a quarter-sized dollop on your shoulder and rub in thoroughly, making sure to cover the front, back, and side of your shoulder, along with the armpit.   2 days before ____ AM   ____ PM              1 day before ____ AM   ____ PM                         3. Do this twice a day for two days.  (Last application is the night before surgery, AFTER using the CHG soap as described below).  4. Do NOT apply benzoyl peroxide gel on the day of surgery.   Incentive Spirometer  An incentive spirometer is a tool that can help keep your lungs clear and active. This tool measures how well you are filling your lungs with each breath. Taking long deep breaths may help reverse or decrease the chance of developing breathing (pulmonary) problems (especially infection) following:  A long period  of time when you are unable to move or be active. BEFORE THE PROCEDURE   If the spirometer includes an indicator to show your best effort, your nurse or respiratory therapist will set it to a desired goal.  If possible, sit up straight or lean slightly forward. Try not to slouch.  Hold the incentive spirometer in an upright position. INSTRUCTIONS FOR USE  1. Sit on the edge of your bed if possible, or sit up as far as you can in bed or on a chair. 2. Hold the incentive spirometer in an upright position. 3. Breathe out normally. 4. Place the mouthpiece in your mouth and seal your lips tightly around it. 5. Breathe in slowly and as deeply as possible, raising the piston or the ball toward the top of the column. 6. Hold your breath for 3-5 seconds or for as long as possible. Allow the piston or ball to fall to the bottom of the column. 7. Remove the mouthpiece from your mouth and breathe out normally. 8. Rest for a few seconds and repeat Steps 1 through 7 at least 10 times every 1-2 hours when you are awake. Take your time and take a few normal breaths between deep breaths. 9. The spirometer may include an indicator to show your best effort. Use the indicator as a goal to work toward during each repetition. 10. After each set of 10 deep breaths, practice coughing to be sure your lungs are clear. If you have an incision (the cut made at the time of surgery), support your incision when coughing by placing a pillow or rolled up towels firmly against it. Once you are able to get out of bed, walk around indoors and cough well. You may stop using the incentive spirometer when instructed by your caregiver.  RISKS AND COMPLICATIONS  Take your time so you do not get dizzy or light-headed.  If you are in pain, you may need to take or ask for pain medication before doing incentive spirometry. It is harder to take  a deep breath if you are having pain. AFTER USE  Rest and breathe slowly and easily.  It  can be helpful to keep track of a log of your progress. Your caregiver can provide you with a simple table to help with this. If you are using the spirometer at home, follow these instructions: [place] IF:   You are having difficultly using the spirometer.  You have trouble using the spirometer as often as instructed.  Your pain medication is not giving enough relief while using the spirometer.  You develop fever of 100.5 F (38.1 C) or higher. SEEK IMMEDIATE MEDICAL CARE IF:   You cough up bloody sputum that had not been present before.  You develop fever of 102 F (38.9 C) or greater.  You develop worsening pain at or near the incision site. MAKE SURE YOU:   Understand these instructions.  Will watch your condition.  Will get help right away if you are not doing well or get worse. Document Released: 11/04/2006 Document Revised: 09/16/2011 Document Reviewed: 01/05/2007 [facility] Patient Information 2014 [place], [place].   ________________________________________________________________________

## 2020-12-07 ENCOUNTER — Encounter (HOSPITAL_COMMUNITY)
Admission: RE | Admit: 2020-12-07 | Discharge: 2020-12-07 | Disposition: A | Discharge status: Home / Self Care | Payer: Medicare Other | Source: Ambulatory Visit | Attending: Orthopedic Surgery | Admitting: Orthopedic Surgery

## 2020-12-07 ENCOUNTER — Other Ambulatory Visit: Payer: Self-pay

## 2020-12-07 ENCOUNTER — Encounter (HOSPITAL_COMMUNITY): Payer: Self-pay

## 2020-12-07 DIAGNOSIS — Z01812 Encounter for preprocedural laboratory examination: Secondary | ICD-10-CM | POA: Insufficient documentation

## 2020-12-07 HISTORY — DX: Malignant (primary) neoplasm, unspecified: C80.1

## 2020-12-07 LAB — CBC
HCT: 46.6 % — ABNORMAL HIGH (ref 36.0–46.0)
Hemoglobin: 15.3 g/dL — ABNORMAL HIGH (ref 12.0–15.0)
MCH: 32.3 pg (ref 26.0–34.0)
MCHC: 32.8 g/dL (ref 30.0–36.0)
MCV: 98.5 fL (ref 80.0–100.0)
Platelets: 167 10*3/uL (ref 150–400)
RBC: 4.73 MIL/uL (ref 3.87–5.11)
RDW: 12.8 % (ref 11.5–15.5)
WBC: 6.1 10*3/uL (ref 4.0–10.5)
nRBC: 0 % (ref 0.0–0.2)

## 2020-12-07 LAB — BASIC METABOLIC PANEL
Anion gap: 7 (ref 5–15)
BUN: 28 mg/dL — ABNORMAL HIGH (ref 8–23)
CO2: 28 mmol/L (ref 22–32)
Calcium: 9.7 mg/dL (ref 8.9–10.3)
Chloride: 103 mmol/L (ref 98–111)
Creatinine, Ser: 0.75 mg/dL (ref 0.44–1.00)
GFR, Estimated: >60 mL/min (ref >60)
Glucose, Bld: 97 mg/dL (ref 70–99)
Potassium: 5 mmol/L (ref 3.5–5.1)
Sodium: 138 mmol/L (ref 135–145)

## 2020-12-07 LAB — SURGICAL PCR SCREEN
MRSA, PCR: NEGATIVE
Staphylococcus aureus: NEGATIVE

## 2020-12-07 NOTE — Progress Notes (Signed)
COVID Vaccine Completed: Yes Date COVID Vaccine completed: 12/2019 COVID vaccine manufacturer: [employer]   PCP - Dr. Gar Ponto Cardiologist -   Chest x-ray -  EKG - 12/11/19 Stress Test -  ECHO - 04/22/19 Cardiac Cath -  Pacemaker/ICD device last checked:  Sleep Study -  CPAP -   Fasting Blood Sugar -  Checks Blood Sugar _____ times a day  Blood Thinner Instructions: Aspirin Instructions: Last Dose:  Anesthesia review: Hx: HTN,COPD  Patient denies shortness of breath, fever, cough and chest pain at PAT appointment   Patient verbalized understanding of instructions that were given to them at the PAT appointment. Patient was also instructed that they will need to review over the PAT instructions again at home before surgery.

## 2020-12-11 ENCOUNTER — Other Ambulatory Visit (HOSPITAL_COMMUNITY)
Admission: RE | Admit: 2020-12-11 | Discharge: 2020-12-11 | Disposition: A | Discharge status: Home / Self Care | Payer: Medicare Other | Source: Ambulatory Visit | Attending: Orthopedic Surgery | Admitting: Orthopedic Surgery

## 2020-12-11 DIAGNOSIS — Z01812 Encounter for preprocedural laboratory examination: Secondary | ICD-10-CM | POA: Diagnosis not present

## 2020-12-11 DIAGNOSIS — Z20822 Contact with and (suspected) exposure to covid-19: Secondary | ICD-10-CM | POA: Diagnosis not present

## 2020-12-11 LAB — SARS CORONAVIRUS 2 (TAT 6-24 HRS): SARS Coronavirus 2: NEGATIVE

## 2020-12-14 ENCOUNTER — Encounter (HOSPITAL_COMMUNITY)
Admission: RE | Disposition: A | Discharge status: Home / Self Care | Payer: Self-pay | Source: Home / Self Care | Attending: Orthopedic Surgery

## 2020-12-14 ENCOUNTER — Ambulatory Visit (HOSPITAL_COMMUNITY): Payer: Medicare Other | Admitting: Certified Registered Nurse Anesthetist

## 2020-12-14 ENCOUNTER — Encounter (HOSPITAL_COMMUNITY): Payer: Self-pay | Admitting: Orthopedic Surgery

## 2020-12-14 ENCOUNTER — Ambulatory Visit (HOSPITAL_COMMUNITY)
Admission: RE | Admit: 2020-12-14 | Discharge: 2020-12-14 | Disposition: A | Discharge status: Home / Self Care | Payer: Medicare Other | Attending: Orthopedic Surgery | Admitting: Orthopedic Surgery

## 2020-12-14 DIAGNOSIS — J449 Chronic obstructive pulmonary disease, unspecified: Secondary | ICD-10-CM | POA: Diagnosis not present

## 2020-12-14 DIAGNOSIS — I10 Essential (primary) hypertension: Secondary | ICD-10-CM | POA: Diagnosis not present

## 2020-12-14 DIAGNOSIS — Z79899 Other long term (current) drug therapy: Secondary | ICD-10-CM | POA: Diagnosis not present

## 2020-12-14 DIAGNOSIS — M19012 Primary osteoarthritis, left shoulder: Secondary | ICD-10-CM | POA: Diagnosis not present

## 2020-12-14 DIAGNOSIS — Z87891 Personal history of nicotine dependence: Secondary | ICD-10-CM | POA: Insufficient documentation

## 2020-12-14 DIAGNOSIS — G8918 Other acute postprocedural pain: Secondary | ICD-10-CM | POA: Diagnosis not present

## 2020-12-14 HISTORY — PX: REVERSE SHOULDER ARTHROPLASTY: SHX5054

## 2020-12-14 SURGERY — ARTHROPLASTY, SHOULDER, TOTAL, REVERSE
Anesthesia: General | Site: Shoulder | Laterality: Left

## 2020-12-14 MED ORDER — ACETAMINOPHEN 10 MG/ML IV SOLN
1000.0000 mg | Freq: Once | INTRAVENOUS | Status: DC | PRN
  Administered 2020-12-14: 1000 mg via INTRAVENOUS

## 2020-12-14 MED ORDER — DEXAMETHASONE SODIUM PHOSPHATE 10 MG/ML IJ SOLN
INTRAMUSCULAR | Status: AC
  Filled 2020-12-14: qty 1

## 2020-12-14 MED ORDER — OXYCODONE HCL 5 MG PO TABS
ORAL_TABLET | ORAL | Status: AC
  Filled 2020-12-14: qty 1

## 2020-12-14 MED ORDER — OXYCODONE HCL 5 MG PO TABS
5.0000 mg | ORAL_TABLET | Freq: Once | ORAL | Status: AC | PRN
  Administered 2020-12-14: 5 mg via ORAL

## 2020-12-14 MED ORDER — PHENYLEPHRINE HCL (PRESSORS) 10 MG/ML IV SOLN
INTRAVENOUS | Status: AC
  Filled 2020-12-14: qty 2

## 2020-12-14 MED ORDER — TRANEXAMIC ACID-NACL 1000-0.7 MG/100ML-% IV SOLN
1000.0000 mg | INTRAVENOUS | Status: AC
  Administered 2020-12-14: 08:00:00 1000 mg via INTRAVENOUS
  Filled 2020-12-14: qty 100

## 2020-12-14 MED ORDER — HYDROMORPHONE HCL 1 MG/ML IJ SOLN
0.2500 mg | INTRAMUSCULAR | Status: DC | PRN
  Administered 2020-12-14 (×3): 0.5 mg via INTRAVENOUS

## 2020-12-14 MED ORDER — HYDROMORPHONE HCL 1 MG/ML IJ SOLN
INTRAMUSCULAR | Status: AC
  Filled 2020-12-14: qty 1

## 2020-12-14 MED ORDER — MIDAZOLAM HCL 2 MG/2ML IJ SOLN
INTRAMUSCULAR | Status: AC
  Filled 2020-12-14: qty 2

## 2020-12-14 MED ORDER — LABETALOL HCL 5 MG/ML IV SOLN
INTRAVENOUS | Status: DC | PRN
  Administered 2020-12-14 (×2): 5 mg via INTRAVENOUS

## 2020-12-14 MED ORDER — SUGAMMADEX SODIUM 200 MG/2ML IV SOLN
INTRAVENOUS | Status: DC | PRN
  Administered 2020-12-14: 200 mg via INTRAVENOUS

## 2020-12-14 MED ORDER — ROCURONIUM BROMIDE 10 MG/ML (PF) SYRINGE
PREFILLED_SYRINGE | INTRAVENOUS | Status: DC | PRN
  Administered 2020-12-14: 70 mg via INTRAVENOUS

## 2020-12-14 MED ORDER — BUPIVACAINE HCL (PF) 0.5 % IJ SOLN
INTRAMUSCULAR | Status: DC | PRN
  Administered 2020-12-14: 30 mL via PERINEURAL

## 2020-12-14 MED ORDER — ACETAMINOPHEN 10 MG/ML IV SOLN
INTRAVENOUS | Status: AC
  Filled 2020-12-14: qty 100

## 2020-12-14 MED ORDER — LIDOCAINE 2% (20 MG/ML) 5 ML SYRINGE
INTRAMUSCULAR | Status: DC | PRN
  Administered 2020-12-14: 100 mg via INTRAVENOUS

## 2020-12-14 MED ORDER — ONDANSETRON HCL 4 MG PO TABS: 4.0000 mg | ORAL_TABLET | Freq: Three times a day (TID) | ORAL | 0 refills | Status: DC | PRN

## 2020-12-14 MED ORDER — HYDROMORPHONE HCL 2 MG PO TABS: 2.0000 mg | ORAL_TABLET | Freq: Four times a day (QID) | ORAL | 0 refills | Status: DC | PRN

## 2020-12-14 MED ORDER — VANCOMYCIN HCL 1000 MG IV SOLR
INTRAVENOUS | Status: DC | PRN
  Administered 2020-12-14: 1000 mg via TOPICAL

## 2020-12-14 MED ORDER — EPHEDRINE SULFATE-NACL 50-0.9 MG/10ML-% IV SOSY
PREFILLED_SYRINGE | INTRAVENOUS | Status: DC | PRN
  Administered 2020-12-14: 10 mg via INTRAVENOUS

## 2020-12-14 MED ORDER — PROPOFOL 10 MG/ML IV BOLUS
INTRAVENOUS | Status: DC | PRN
  Administered 2020-12-14: 130 mg via INTRAVENOUS

## 2020-12-14 MED ORDER — VANCOMYCIN HCL 1000 MG IV SOLR
INTRAVENOUS | Status: AC
  Filled 2020-12-14: qty 1000

## 2020-12-14 MED ORDER — ORAL CARE MOUTH RINSE: 15.0000 mL | Freq: Once | OROMUCOSAL | Status: AC

## 2020-12-14 MED ORDER — OXYCODONE HCL 5 MG/5ML PO SOLN: 5.0000 mg | Freq: Once | ORAL | Status: AC | PRN

## 2020-12-14 MED ORDER — LACTATED RINGERS IV SOLN: INTRAVENOUS | Status: DC

## 2020-12-14 MED ORDER — LACTATED RINGERS IV BOLUS
500.0000 mL | Freq: Once | INTRAVENOUS | Status: AC
  Administered 2020-12-14: 500 mL via INTRAVENOUS

## 2020-12-14 MED ORDER — SODIUM CHLORIDE 0.9 % IR SOLN
Status: DC | PRN
  Administered 2020-12-14: 1000 mL

## 2020-12-14 MED ORDER — CEFAZOLIN SODIUM-DEXTROSE 2-4 GM/100ML-% IV SOLN
2.0000 g | INTRAVENOUS | Status: AC
  Administered 2020-12-14: 08:00:00 2 g via INTRAVENOUS
  Filled 2020-12-14: qty 100

## 2020-12-14 MED ORDER — LIDOCAINE 2% (20 MG/ML) 5 ML SYRINGE
INTRAMUSCULAR | Status: AC
  Filled 2020-12-14: qty 5

## 2020-12-14 MED ORDER — CHLORHEXIDINE GLUCONATE 0.12 % MT SOLN
15.0000 mL | Freq: Once | OROMUCOSAL | Status: AC
  Administered 2020-12-14: 15 mL via OROMUCOSAL

## 2020-12-14 MED ORDER — ONDANSETRON HCL 4 MG/2ML IJ SOLN: 4.0000 mg | Freq: Once | INTRAMUSCULAR | Status: DC | PRN

## 2020-12-14 MED ORDER — ROCURONIUM BROMIDE 10 MG/ML (PF) SYRINGE
PREFILLED_SYRINGE | INTRAVENOUS | Status: AC
  Filled 2020-12-14: qty 10

## 2020-12-14 MED ORDER — DEXAMETHASONE SODIUM PHOSPHATE 10 MG/ML IJ SOLN
INTRAMUSCULAR | Status: DC | PRN
  Administered 2020-12-14: 5 mg via INTRAVENOUS

## 2020-12-14 MED ORDER — PHENYLEPHRINE HCL-NACL 10-0.9 MG/250ML-% IV SOLN
INTRAVENOUS | Status: DC | PRN
  Administered 2020-12-14: 50 ug/min via INTRAVENOUS

## 2020-12-14 MED ORDER — LABETALOL HCL 5 MG/ML IV SOLN
INTRAVENOUS | Status: AC
  Filled 2020-12-14: qty 4

## 2020-12-14 MED ORDER — ONDANSETRON HCL 4 MG/2ML IJ SOLN
INTRAMUSCULAR | Status: AC
  Filled 2020-12-14: qty 2

## 2020-12-14 MED ORDER — FENTANYL CITRATE (PF) 250 MCG/5ML IJ SOLN
INTRAMUSCULAR | Status: DC | PRN
  Administered 2020-12-14: 100 ug via INTRAVENOUS

## 2020-12-14 MED ORDER — PROPOFOL 10 MG/ML IV BOLUS
INTRAVENOUS | Status: AC
  Filled 2020-12-14: qty 20

## 2020-12-14 MED ORDER — FENTANYL CITRATE (PF) 100 MCG/2ML IJ SOLN
INTRAMUSCULAR | Status: AC
  Filled 2020-12-14: qty 2

## 2020-12-14 MED ORDER — ONDANSETRON HCL 4 MG/2ML IJ SOLN
INTRAMUSCULAR | Status: DC | PRN
  Administered 2020-12-14: 4 mg via INTRAVENOUS

## 2020-12-14 MED ORDER — LACTATED RINGERS IV BOLUS
250.0000 mL | Freq: Once | INTRAVENOUS | Status: AC
  Administered 2020-12-14: 250 mL via INTRAVENOUS

## 2020-12-14 SURGICAL SUPPLY — 70 items
ADH SKN CLS APL DERMABOND .7 (GAUZE/BANDAGES/DRESSINGS) ×1
AID PSTN UNV HD RSTRNT DISP (MISCELLANEOUS) ×1
BAG SPEC THK2 15X12 ZIP CLS (MISCELLANEOUS) ×1
BAG ZIPLOCK 12X15 (MISCELLANEOUS) ×3 IMPLANT
BLADE SAW SGTL 83.5X18.5 (BLADE) ×3 IMPLANT
BSPLAT GLND +2X24 MDLR (Joint) ×1 IMPLANT
COOLER ICEMAN CLASSIC (MISCELLANEOUS) IMPLANT
COVER BACK TABLE 60X90IN (DRAPES) ×3 IMPLANT
COVER SURGICAL LIGHT HANDLE (MISCELLANEOUS) ×3 IMPLANT
COVER WAND RF STERILE (DRAPES) IMPLANT
CUP SUT UNIV REVERS 36 NEUTRAL (Cup) ×2 IMPLANT
DERMABOND ADVANCED (GAUZE/BANDAGES/DRESSINGS) ×2
DERMABOND ADVANCED .7 DNX12 (GAUZE/BANDAGES/DRESSINGS) ×1 IMPLANT
DRAPE INCISE IOBAN 66X45 STRL (DRAPES) IMPLANT
DRAPE ORTHO SPLIT 77X108 STRL (DRAPES) ×6
DRAPE SHEET LG 3/4 BI-LAMINATE (DRAPES) ×3 IMPLANT
DRAPE SURG 17X11 SM STRL (DRAPES) ×3 IMPLANT
DRAPE SURG ORHT 6 SPLT 77X108 (DRAPES) ×2 IMPLANT
DRAPE U-SHAPE 47X51 STRL (DRAPES) ×3 IMPLANT
DRESSING AQUACEL AG SP 3.5X6 (GAUZE/BANDAGES/DRESSINGS) ×1 IMPLANT
DRSG AQUACEL AG ADV 3.5X10 (GAUZE/BANDAGES/DRESSINGS) ×2 IMPLANT
DRSG AQUACEL AG SP 3.5X6 (GAUZE/BANDAGES/DRESSINGS) ×3
DURAPREP 26ML APPLICATOR (WOUND CARE) ×3 IMPLANT
ELECT BLADE TIP CTD 4 INCH (ELECTRODE) ×3 IMPLANT
ELECT REM PT RETURN 15FT ADLT (MISCELLANEOUS) ×3 IMPLANT
FACESHIELD WRAPAROUND (MASK) ×12 IMPLANT
FACESHIELD WRAPAROUND OR TEAM (MASK) ×4 IMPLANT
GLENOID UNI REV MOD 24 +2 LAT (Joint) ×2 IMPLANT
GLENOSPHERE 36 +4 LAT/24 (Joint) ×2 IMPLANT
GLOVE SRG 8 PF TXTR STRL LF DI (GLOVE) ×1 IMPLANT
GLOVE SURG ENC MOIS LTX SZ7 (GLOVE) ×3 IMPLANT
GLOVE SURG ENC MOIS LTX SZ7.5 (GLOVE) ×3 IMPLANT
GLOVE SURG UNDER POLY LF SZ7 (GLOVE) ×3 IMPLANT
GLOVE SURG UNDER POLY LF SZ8 (GLOVE) ×3
GOWN STRL REUS W/TWL LRG LVL3 (GOWN DISPOSABLE) ×6 IMPLANT
INSERT HUMERAL 36 +6 (Shoulder) ×2 IMPLANT
KIT BASIN OR (CUSTOM PROCEDURE TRAY) ×3 IMPLANT
KIT TURNOVER KIT A (KITS) ×3 IMPLANT
MANIFOLD NEPTUNE II (INSTRUMENTS) ×3 IMPLANT
NDL TAPERED W/ NITINOL LOOP (MISCELLANEOUS) ×1 IMPLANT
NEEDLE TAPERED W/ NITINOL LOOP (MISCELLANEOUS) ×3 IMPLANT
NS IRRIG 1000ML POUR BTL (IV SOLUTION) ×3 IMPLANT
PACK SHOULDER (CUSTOM PROCEDURE TRAY) ×3 IMPLANT
PAD ARMBOARD 7.5X6 YLW CONV (MISCELLANEOUS) ×3 IMPLANT
PAD COLD SHLDR WRAP-ON (PAD) IMPLANT
PIN NITINOL TARGETER 2.8 (PIN) IMPLANT
PIN SET MODULAR GLENOID SYSTEM (PIN) ×2 IMPLANT
RESTRAINT HEAD UNIVERSAL NS (MISCELLANEOUS) ×3 IMPLANT
SCREW CENTRAL MODULAR 25 (Screw) ×2 IMPLANT
SCREW PERI LOCK 5.5X16 (Screw) ×2 IMPLANT
SCREW PERI LOCK 5.5X32 (Screw) ×2 IMPLANT
SCREW PERIPHERAL 5.5X20 LOCK (Screw) ×2 IMPLANT
SCREW PERIPHERAL 5.5X28 LOCK (Screw) ×2 IMPLANT
SLING ARM FOAM STRAP LRG (SOFTGOODS) IMPLANT
SLING ARM FOAM STRAP MED (SOFTGOODS) IMPLANT
SPONGE LAP 18X18 RF (DISPOSABLE) IMPLANT
STEM HUMERAL UNIVER REV SIZE 7 (Stem) ×2 IMPLANT
SUCTION FRAZIER HANDLE 12FR (TUBING) ×3
SUCTION TUBE FRAZIER 12FR DISP (TUBING) ×1 IMPLANT
SUT FIBERWIRE #2 38 T-5 BLUE (SUTURE)
SUT MNCRL AB 3-0 PS2 18 (SUTURE) ×3 IMPLANT
SUT MON AB 2-0 CT1 36 (SUTURE) ×3 IMPLANT
SUT VIC AB 1 CT1 36 (SUTURE) ×3 IMPLANT
SUTURE FIBERWR #2 38 T-5 BLUE (SUTURE) IMPLANT
SUTURE TAPE 1.3 40 TPR END (SUTURE) ×2 IMPLANT
SUTURETAPE 1.3 40 TPR END (SUTURE) ×6
TOWEL OR 17X26 10 PK STRL BLUE (TOWEL DISPOSABLE) ×3 IMPLANT
TOWEL OR NON WOVEN STRL DISP B (DISPOSABLE) ×5 IMPLANT
WATER STERILE IRR 1000ML POUR (IV SOLUTION) ×6 IMPLANT
YANKAUER SUCT BULB TIP 10FT TU (MISCELLANEOUS) ×3 IMPLANT

## 2020-12-14 NOTE — Transfer of Care (Signed)
Immediate Anesthesia Transfer of Care Note  Patient: Jessica Serrano  Procedure(s) Performed: REVERSE SHOULDER ARTHROPLASTY (Left: Shoulder)  Patient Location: PACU  Anesthesia Type:General  Level of Consciousness: awake, alert , oriented and patient cooperative  Airway & Oxygen Therapy: Patient Spontanous Breathing and Patient connected to face mask oxygen  Post-op Assessment: Report given to RN and Post -op Vital signs reviewed and stable  Post vital signs: Reviewed and stable  Last Vitals:  Vitals Value Taken Time  BP 158/66 12/14/20 0938  Temp    Pulse 70 12/14/20 0943  Resp 20 12/14/20 0943  SpO2 100 % 12/14/20 0943  Vitals shown include unvalidated device data.  Last Pain:  Vitals:   12/14/20 0631  TempSrc:   PainSc: 3       Patients Stated Pain Goal: 3 (03/54/65 6812)  Complications: No notable events documented.

## 2020-12-14 NOTE — Anesthesia Preprocedure Evaluation (Addendum)
Anesthesia Evaluation  Patient identified by MRN, date of birth, ID band Patient awake    Reviewed: Allergy & Precautions, NPO status , Patient's Chart, lab work & pertinent test results  Airway Mallampati: II  TM Distance: <3 FB Neck ROM: Limited    Dental no notable dental hx.    Pulmonary shortness of breath, COPD, former smoker,  Emphysema on CT   Pulmonary exam normal breath sounds clear to auscultation       Cardiovascular hypertension, Normal cardiovascular exam Rhythm:Regular Rate:Normal     Neuro/Psych negative neurological ROS  negative psych ROS   GI/Hepatic negative GI ROS, Neg liver ROS,   Endo/Other  Morbid obesity  Renal/GU negative Renal ROS  negative genitourinary   Musculoskeletal  (+) Arthritis , Osteoarthritis,    Abdominal   Peds negative pediatric ROS (+)  Hematology negative hematology ROS (+)   Anesthesia Other Findings   Reproductive/Obstetrics negative OB ROS                            Anesthesia Physical Anesthesia Plan  ASA: 3  Anesthesia Plan: General   Post-op Pain Management:  Regional for Post-op pain   Induction: Intravenous  PONV Risk Score and Plan: 3 and Ondansetron, Dexamethasone and Treatment may vary due to age or medical condition  Airway Management Planned: Oral ETT  Additional Equipment:   Intra-op Plan:   Post-operative Plan: Extubation in OR  Informed Consent: I have reviewed the patients History and Physical, chart, labs and discussed the procedure including the risks, benefits and alternatives for the proposed anesthesia with the patient or authorized representative who has indicated his/her understanding and acceptance.     Dental advisory given  Plan Discussed with: CRNA and Surgeon  Anesthesia Plan Comments:         Anesthesia Quick Evaluation

## 2020-12-14 NOTE — Anesthesia Postprocedure Evaluation (Signed)
Anesthesia Post Note  Patient: Jessica Serrano  Procedure(s) Performed: REVERSE SHOULDER ARTHROPLASTY (Left: Shoulder)     Patient location during evaluation: PACU Anesthesia Type: General Level of consciousness: awake and alert Pain management: pain level controlled Vital Signs Assessment: post-procedure vital signs reviewed and stable Respiratory status: spontaneous breathing, nonlabored ventilation, respiratory function stable and patient connected to nasal cannula oxygen Cardiovascular status: blood pressure returned to baseline and stable Postop Assessment: no apparent nausea or vomiting Anesthetic complications: no   No notable events documented.  Last Vitals:  Vitals:   12/14/20 1015 12/14/20 1020  BP: 102/64   Pulse: 68 69  Resp: 19 (!) 21  Temp:    SpO2: 98% 98%    Last Pain:  Vitals:   12/14/20 1020  TempSrc:   PainSc: 6                  Cray Monnin S

## 2020-12-14 NOTE — Op Note (Signed)
12/14/2020  9:29 AM  PATIENT:   Jessica Serrano  80 y.o. female  PRE-OPERATIVE DIAGNOSIS:  Left advanced shoulder arthritis  POST-OPERATIVE DIAGNOSIS: Same with underlying significant rotator cuff dysfunction impairing  PROCEDURE: Left shoulder reverse arthroplasty utilizing a press-fit size 7 Arthrex stem with a +6 polyethylene insert on a neutral metaphysis, 36/+4 glenosphere on a small/+ baseplate  SURGEON:  Skylynn Burkley, Metta Clines M.D.  ASSISTANTS: Jenetta Loges, PA-C  ANESTHESIA:   General endotracheal and supraclavicular block with Marcaine  EBL: 250 cc  SPECIMEN: None  Drains: None   PATIENT DISPOSITION:  PACU - hemodynamically stable.    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  Preoperatively, I counseled the patient regarding treatment options and risks versus benefits thereof.  Possible surgical complications were all reviewed including potential for bleeding, infection, neurovascular injury, persistent pain, loss of motion, anesthetic complication, failure of the implant, and possible need for additional surgery. They understand and accept and agrees with our planned procedure.   Procedure in detail:  After undergoing routine preop evaluation the patient received prophylactic antibiotics and interscalene block with Exparel was established in the holding area by the anesthesia department.  The patient was subsequently placed supine on the operating table and underwent the smooth induction of a general endotracheal anesthesia seizure.  Placed into the beachchair position and appropriately padded and protected.  Left shoulder girdle region was sterilely prepped and draped in standard fashion.  Timeout was  called.  A deltopectoral approach left shoulder is made through a 10 cm incision.  Dissection carried deeply and exploration did not identify a distinct deltopectoral interval with no dominant vein.  Dissection proximally carried down to the coracoid process and the interval along the muscular interface was then developed from proximal to distal with some moderate bleeding encountered and number of crossing veins were ligated.  Ultimately the functional deltopectoral interval was established in the upper centimeter and a half the pectoralis major tendon was then tenotomized for exposure.  Conjoined tendon was mobilized and retracted medially.  Long head biceps tendon was tenotomized and the remnant of the subscapularis was then divided from its insertion onto the lesser tuberosity and tagged.  However this musculotendinous unit did not show any effective elasticity and so we did not anticipate ultimately performing a subscap repair.  At this point capsular attachments were then divided from the anterior and infra margins of the humeral neck and the humeral head was then delivered through the wound.  An extra medullary guide was then used to outline our proposed humeral head resection which we then performed with an oscillating saw at approximately 20 degrees retroversion.  Marginal osteophytes were then removed with a rondure and a metal cap was then placed over the cut proximal humeral surface.  The glenoid was then exposed with appropriate retractors and a circumferential labral resection was then completed.  A guidepin was then directed into the center of the glenoid and it was then reamed with the central and peripheral reamers to a stable subchondral bony bed.  Final preparation completed with central drill and tap at 25 mm.  Our baseplate was then assembled with vancomycin powder applied to the lag screw threads and the baseplate was then inserted with good purchase and fixation.  All  the peripheral locking  screws then placed with standard technique with excellent fixation.  A 36/+4 glenosphere was then impacted onto the baseplate and the central locking screw was then placed.  We then returned our attention back to the proximal humerus where the canal was opened and we ultimately broached up to a size 7 at approximately 20 degrees of retroversion.  A neutral reaming guide was then used to prepare the metaphysis.  A trial implant was then placed and trial reduction showed good motion good stability good soft tissue balance.  This point the trial was then removed the final implant was assembled.  Vancomycin powder was then spread liberally into the humeral canal and the final implant was then seated with excellent fit and fixation.  With we then performed a series of trial reductions and ultimately felt that the +6 gave [place] the best soft tissue balance with good motion and stability.  The trial was then removed.  The final +6 polywas impacted and final reduction was then performed.  We are very pleased with overall motion, stability, and soft tissue balance.  Wounds and copes irrigated.  Final hemostasis was obtained.  We did confirm the subscapularis did not have appropriate elasticity so was not repaired.  The deltopectoral interval was then reapproximated with surgery figure-of-eight and 1 Vicryl sutures.  2-0 Monocryl used for the subcu layer and intracuticular 3-0 Monocryl for the skin followed by Dermabond and Aquacel dressing.  The left arm was then placed into a sling.  Patient was awakened, extubated, and taken to the recovery room in stable condition.  Jenetta Loges, PA-C was utilized as an Environmental consultant throughout this case, essential for help with positioning the patient, positioning extremity, tissue manipulation, implantation of the prosthesis, suture management, wound closure, and intraoperative decision-making.  Marin Shutter MD   Contact # [phone]

## 2020-12-14 NOTE — Anesthesia Procedure Notes (Signed)
Procedure Name: Intubation Date/Time: 12/14/2020 7:48 AM Performed by: Mitzie Na, CRNA Pre-anesthesia Checklist: Patient identified, Emergency Drugs available, Suction available and Patient being monitored Patient Re-evaluated:Patient Re-evaluated prior to induction Oxygen Delivery Method: Circle system utilized Preoxygenation: Pre-oxygenation with 100% oxygen Induction Type: IV induction Ventilation: Mask ventilation without difficulty Laryngoscope Size: Mac and 3 Grade View: Grade I Tube type: Oral Number of attempts: 1 Airway Equipment and Method: Stylet and Oral airway Placement Confirmation: ETT inserted through vocal cords under direct vision, positive ETCO2 and breath sounds checked- equal and bilateral Secured at: 24 cm Tube secured with: Tape Dental Injury: Teeth and Oropharynx as per pre-operative assessment

## 2020-12-14 NOTE — Anesthesia Procedure Notes (Signed)
Anesthesia Procedure Image    

## 2020-12-14 NOTE — Discharge Instructions (Signed)
 Kevin M. Supple, M.D., F.A.A.O.S. Orthopaedic Surgery Specializing in Arthroscopic and Reconstructive Surgery of the Shoulder [phone] [address] - , [place] [address] - Fax [phone]   POST-OP TOTAL SHOULDER REPLACEMENT INSTRUCTIONS  1. Follow up in the office for your first post-op appointment 10-14 days from the date of your surgery. If you do not already have a scheduled appointment, our office will contact you to schedule.  2. The bandage over your incision is waterproof. You may begin showering with this dressing on. You may leave this dressing on until first follow up appointment within 2 weeks. We prefer you leave this dressing in place until follow up however after 5-7 days if you are having itching or skin irritation and would like to remove it you may do so. Go slow and tug at the borders gently to break the bond the dressing has with the skin. At this point if there is no drainage it is okay to go without a bandage or you may cover it with a light guaze and tape. You can also expect significant bruising around your shoulder that will drift down your arm and into your chest wall. This is very normal and should resolve over several days.   3. Wear your sling/immobilizer at all times except to perform the exercises below or to occasionally let your arm dangle by your side to stretch your elbow. You also need to sleep in your sling immobilizer until instructed otherwise. It is ok to remove your sling if you are sitting in a controlled environment and allow your arm to rest in a position of comfort by your side or on your lap with pillows to give your neck and skin a break from the sling. You may remove it to allow arm to dangle by side to shower. If you are up walking around and when you go to sleep at night you need to wear it.  4. Range of motion to your elbow, wrist, and hand are encouraged 3-5 times daily. Exercise to your hand and fingers helps to reduce  swelling you may experience.   5. Prescriptions for a pain medication and a muscle relaxant are provided for you. It is recommended that if you are experiencing pain that you pain medication alone is not controlling, add the muscle relaxant along with the pain medication which can give additional pain relief. The first 1-2 days is generally the most severe of your pain and then should gradually decrease. As your pain lessens it is recommended that you decrease your use of the pain medications to an "as needed basis'" only and to always comply with the recommended dosages of the pain medications.  6. Pain medications can produce constipation along with their use. If you experience this, the use of an over the counter stool softener or laxative daily is recommended.   7. For additional questions or concerns, please do not hesitate to call the office. If after hours there is an answering service to forward your concerns to the physician on call.  8.Pain control following an exparel block  To help control your post-operative pain you received a nerve block  performed with Exparel which is a long acting anesthetic (numbing agent) which can provide pain relief and sensations of numbness (and relief of pain) in the operative shoulder and arm for up to 3 days. Sometimes it provides mixed relief, meaning you may still have numbness in certain areas of the arm but can still be able to   move  parts of that arm, hand, and fingers. We recommend that your prescribed pain medications  be used as needed. We do not feel it is necessary to "pre medicate" and "stay ahead" of pain.  Taking narcotic pain medications when you are not having any pain can lead to unnecessary and potentially dangerous side effects.    9. Use the ice machine as much as possible in the first 5-7 days from surgery, then you can wean its use to as needed. The ice typically needs to be replaced every 6 hours, instead of ice you can actually freeze  water bottles to put in the cooler and then fill water around them to avoid having to purchase ice. You can have spare water bottles freezing to allow you to rotate them once they have melted. Try to have a thin shirt or light cloth or towel under the ice wrap to protect your skin.   FOR ADDITIONAL INFO ON ICE MACHINE AND INSTRUCTIONS GO TO THE WEBSITE AT  https://www.[employer].com/products/donjoy/donjoy-iceman-[email or web address]  10.  We recommend that you avoid any dental work or cleaning in the first 3 months following your joint replacement. This is to help minimize the possibility of infection from the bacteria in your mouth that enters your bloodstream during dental work. We also recommend that you take an antibiotic prior to your dental work for the first year after your shoulder replacement to further help reduce that risk. Please simply contact our office for antibiotics to be sent to your pharmacy prior to dental work.  11. Dental Antibiotics:  In most cases prophylactic antibiotics for Dental procdeures after total joint surgery are not necessary.  Exceptions are as follows:  1. History of prior total joint infection  2. Severely immunocompromised (Organ Transplant, cancer chemotherapy, Rheumatoid biologic meds such as Humera)  3. Poorly controlled diabetes (A1C &gt; 8.0, blood glucose over 200)  If you have one of these conditions, contact your surgeon for an antibiotic prescription, prior to your dental procedure.   POST-OP EXERCISES  Pendulum Exercises  Perform pendulum exercises while standing and bending at the waist. Support your uninvolved arm on a table or chair and allow your operated arm to hang freely. Make sure to do these exercises passively - not using you shoulder muscles. These exercises can be performed once your nerve block effects have worn off.  Repeat 20 times. Do 3 sessions per day.     

## 2020-12-14 NOTE — H&P (Signed)
Jessica Serrano    Chief Complaint: Left advanced shoulder arthritis HPI: The patient is a 80 y.o. female with chronic and progressively increasing left shoulder pain related to severe arthritis and associated rotator cuff dysfunction.  Due to increasing functional imitations and failure to respond to prolonged attempts at conservative management, the patient is brought to the operating room at this time for planned left shoulder reverse arthroplasty  Past Medical History:  Diagnosis Date   Bell's palsy    right   Cancer ([place])    skin   COPD (chronic obstructive pulmonary disease) (HCC)    mild   Decrease in appetite 09/25/2015   Depression    Diarrhea 09/25/2015   Dyspnea    with exertion    Hemochromatosis    High blood pressure    History of vaginal bleeding 09/05/2015   LLQ pain 09/25/2015   Neuropathy    Osteoarthritis    knees   Vaginal atrophy 09/05/2015   Weight loss 09/25/2015    Past Surgical History:  Procedure Laterality Date   ABDOMINAL HYSTERECTOMY     APPENDECTOMY     BACK SURGERY  2018   screws and cage in lower back   BREAST SURGERY  08/2015   biopsy left breast   CHOLECYSTECTOMY     1998   ELBOW SURGERY     ESOPHAGOGASTRODUODENOSCOPY N/A 03/18/2014   Procedure: ESOPHAGOGASTRODUODENOSCOPY (EGD);  Surgeon: Rogene Houston, MD;  Location: [facility];  Service: Endoscopy;  Laterality: N/A;  730   EYE SURGERY Bilateral 2014,2012   GALLBLADDER SURGERY     JOINT REPLACEMENT Left    KNEE SURGERY Left    lap chole stones     MALONEY DILATION N/A 03/18/2014   Procedure: MALONEY DILATION;  Surgeon: Rogene Houston, MD;  Location: [facility];  Service: Endoscopy;  Laterality: N/A;   REVERSE SHOULDER ARTHROPLASTY Right 04/13/2020   Procedure: REVERSE SHOULDER ARTHROPLASTY;  Surgeon: Justice Britain, MD;  Location: [facility];  Service: Orthopedics;  Laterality: Right;  145min   SHOULDER SURGERY     TONSILLECTOMY AND ADENOIDECTOMY     TOTAL HIP ARTHROPLASTY Left  12/08/2019   Procedure: TOTAL HIP ARTHROPLASTY ANTERIOR APPROACH;  Surgeon: Gaynelle Arabian, MD;  Location: [facility];  Service: Orthopedics;  Laterality: Left;  163min   TOTAL KNEE ARTHROPLASTY     1998 left knee   TRIGGER FINGER RELEASE      Family History  Problem Relation Age of Onset   Stroke Other    Stomach cancer Other    Heart Problems Other    Arthritis Other     Social History:  reports that she quit smoking about 29 years ago. Her smoking use included cigarettes. She has a 126.00 pack-year smoking history. She has never used smokeless tobacco. She reports that she does not drink alcohol and does not use drugs.   Medications Prior to Admission  Medication Sig Dispense Refill   acetaminophen (TYLENOL) 500 MG tablet Take 500 mg by mouth every 6 (six) hours as needed for moderate pain.     atenolol (TENORMIN) 50 MG tablet Take 50 mg by mouth every evening.     atorvastatin (LIPITOR) 20 MG tablet Take 20 mg by mouth at bedtime.     baclofen (LIORESAL) 10 MG tablet Take 10 mg by mouth 3 (three) times daily.     docusate sodium (COLACE) 100 MG capsule Take 100 mg by mouth every evening.     furosemide (LASIX) 40 MG  tablet Take 40 mg by mouth See admin instructions. Take 40 mg twice daily, may take a third 40 mg dose as needed for swelling     gabapentin (NEURONTIN) 600 MG tablet Take 600-1,200 mg by mouth See admin instructions. Take 600 mg in the evening, and 1200 mg at bedtime     losartan (COZAAR) 25 MG tablet Take 25 mg by mouth daily.     meclizine (ANTIVERT) 12.5 MG tablet Take 12.5 mg by mouth 2 (two) times daily.     meloxicam (MOBIC) 15 MG tablet Take 15 mg by mouth daily.     Oxycodone HCl 10 MG TABS Take 10 mg by mouth in the morning, at noon, in the evening, and at bedtime.     polyethylene glycol (MIRALAX / GLYCOLAX) 17 g packet Take 17 g by mouth daily as needed for moderate constipation.     rOPINIRole (REQUIP) 1 MG tablet Take 1 mg by mouth 2 (two) times daily.       temazepam (RESTORIL) 15 MG capsule Take 30 mg by mouth at bedtime.      venlafaxine (EFFEXOR) 75 MG tablet Take 75 mg by mouth 2 (two) times daily.       Physical Exam: Left shoulder demonstrates painful and guarded motion as noted at recent office visits.  Global weakness to manual muscle testing.  Grossly neurovascular intact.  Plain radiographs confirm severe glenohumeral joint osteoarthritis with peripheral osteophyte formation, subchondral sclerosis, and changes consistent with chronic rotator cuff dysfunction  Vitals  Temp:  [98.1 F (36.7 C)] 98.1 F (36.7 C) (06/09 0537) Pulse Rate:  [66] 66 (06/09 0537) Resp:  [20] 20 (06/09 0537) BP: (149)/(65) 149/65 (06/09 0537) SpO2:  [96 %] 96 % (06/09 0537)  Assessment/Plan  Impression: Left advanced shoulder arthritis  Plan of Action: Procedure(s): REVERSE SHOULDER ARTHROPLASTY  Jessica Serrano 12/14/2020, 5:56 AM Contact # [phone]

## 2020-12-14 NOTE — Anesthesia Procedure Notes (Signed)
Anesthesia Regional Block: Supraclavicular block   Pre-Anesthetic Checklist: , timeout performed,  Correct Patient, Correct Site, Correct Laterality,  Correct Procedure, Correct Position, site marked,  Risks and benefits discussed,  Surgical consent,  Pre-op evaluation,  At surgeon's request and post-op pain management  Laterality: Right  Prep: chloraprep       Needles:  Injection technique: Single-shot  Needle Type: Echogenic Needle     Needle Length: 9cm      Additional Needles:   Procedures:,,,, ultrasound used (permanent image in chart),,    Narrative:  Start time: 12/14/2020 6:52 AM End time: 12/14/2020 7:15 AM Injection made incrementally with aspirations every 5 mL.  Performed by: Personally  Anesthesiologist: Myrtie Soman, MD  Additional Notes: Patient tolerated the procedure well without complications

## 2020-12-14 NOTE — Evaluation (Signed)
Occupational Therapy Evaluation Patient Details Name: Jessica Serrano MRN: [id number] DOB: February 23, 1941 Today's Date: 12/14/2020    History of Present Illness Patient s/p Left reverse TSA. Hx of L TKA, back surgery, Bell's Palsy, COPD, R reverse TSA.   Clinical Impression   [name] is a 80 year old s/p shoulder replacement without functional use of left non-dominant upper extremity secondary to effects of surgery, interscalene block and shoulder precautions. Therapist provided education and instruction to patient and daughter in regards to exercises, shoulder ROM and precautions, positioning, donning upper extremity clothing and bathing while maintaining shoulder precautions, use of shoulder cuff and cooler and donning/doffing sling. Patient and daughter verbalized understanding handouts provided to maximize retention of information. Patient and daughter familiar with compensatory strategies from prior surgery. Patient needed assistance to donn shirt, underwear, pants, socks and shoes. Patient's mobility limited and balance poor at baseline. Patient to follow up with MD for further therapy needs.      Follow Up Recommendations  Follow surgeon's recommendation for DC plan and follow-up therapies    Equipment Recommendations  None recommended by OT    Recommendations for Other Services       Precautions / Restrictions Precautions Precautions: Shoulder Type of Shoulder Precautions: If sitting in controlled environment, ok to come out of sling to give neck a break. Please sleep in it to protect until follow up in office.     OK to use operative arm for feeding, hygiene and ADLs.   Ok to instruct Pendulums and lap slides as exercises. Ok to use operative arm within the following parameters for ADL purposes     New ROM (8/18)   Ok for PROM, AAROM, AROM within pain tolerance and within the following ROM   ER 20   ABD 45   FE 60 Shoulder Interventions: Shoulder sling/immobilizer;At all  times;Off for dressing/bathing/exercises Precaution Booklet Issued:  (handouts) Required Braces or Orthoses: Sling Restrictions Weight Bearing Restrictions: Yes LUE Weight Bearing: Non weight bearing      Mobility Bed Mobility                    Transfers Overall transfer level: Needs assistance   Transfers: Sit to/from Stand Sit to Stand: Mod assist         General transfer comment: mod assist to stand from recliner - reports using a lift chair at home.    Balance Overall balance assessment: Needs assistance Sitting-balance support: No upper extremity supported;Feet supported Sitting balance-Leahy Scale: Fair     Standing balance support: Single extremity supported Standing balance-Leahy Scale: Poor                             ADL either performed or assessed with clinical judgement   ADL Overall ADL's : Needs assistance/impaired Eating/Feeding: Set up   Grooming: Set up   Upper Body Bathing: Set up;Minimal assistance   Lower Body Bathing: Moderate assistance;Sit to/from stand   Upper Body Dressing : Maximal assistance;Sitting;Adhering to UE precautions   Lower Body Dressing: Maximal assistance;Sit to/from stand   Toilet Transfer: Moderate assistance;BSC   Toileting- Clothing Manipulation and Hygiene: Maximal assistance;Sit to/from stand               Vision Patient Visual Report: No change from baseline       Perception     Praxis      Pertinent Vitals/Pain Pain Assessment: No/denies pain (secondary to effects of  block)     Hand Dominance Right   Extremity/Trunk Assessment Upper Extremity Assessment Upper Extremity Assessment: LUE deficits/detail LUE Deficits / Details: able to grossly open and close fingers otherwise minimal AROM secondary to effects of interscalene block LUE Sensation: decreased light touch           Communication Communication Communication: HOH   Cognition Arousal/Alertness:  Awake/alert Behavior During Therapy: WFL for tasks assessed/performed Overall Cognitive Status: Within Functional Limits for tasks assessed                                     General Comments       Exercises     Shoulder Instructions Shoulder Instructions Donning/doffing shirt without moving shoulder: Caregiver independent with task Method for sponge bathing under operated UE: Caregiver independent with task Donning/doffing sling/immobilizer: Caregiver independent with task Correct positioning of sling/immobilizer: Caregiver independent with task Pendulum exercises (written home exercise program): Caregiver independent with task ROM for elbow, wrist and digits of operated UE: Caregiver independent with task Sling wearing schedule (on at all times/off for ADL's): Caregiver independent with task Proper positioning of operated UE when showering: Caregiver independent with task Dressing change: Caregiver independent with task Positioning of UE while sleeping: Caregiver independent with task    Home Living Family/patient expects to be discharged to:: Private residence Living Arrangements: Children Available Help at Discharge: Family Type of Home: House             Bathroom Shower/Tub: Walk-in shower         Home Equipment: Bedside commode;Cane - single point;Walker - 2 wheels          Prior Functioning/Environment Level of Independence: Needs assistance  Gait / Transfers Assistance Needed: Very ltd ambulation with RW or canes, uses lift chair ADL's / Homemaking Assistance Needed: Assistance with bathing and dressing.            OT Problem List: Decreased strength;Decreased range of motion;Obesity;Impaired UE functional use;Pain      OT Treatment/Interventions:      OT Goals(Current goals can be found in the care plan section) Acute Rehab OT Goals OT Goal Formulation: All assessment and education complete, DC therapy  OT Frequency:     Barriers  to D/C:            Co-evaluation              AM-PAC OT "6 Clicks" Daily Activity     Outcome Measure Help from another person eating meals?: A Little Help from another person taking care of personal grooming?: A Little Help from another person toileting, which includes using toliet, bedpan, or urinal?: A Lot Help from another person bathing (including washing, rinsing, drying)?: A Lot Help from another person to put on and taking off regular upper body clothing?: A Lot Help from another person to put on and taking off regular lower body clothing?: A Lot 6 Click Score: 14   End of Session Nurse Communication:  (OT education complete)  Activity Tolerance: Patient tolerated treatment well Patient left: in chair;with call bell/phone within reach;with family/visitor present  OT Visit Diagnosis: Pain;Muscle weakness (generalized) (M62.81)                Time: 0786-7544 OT Time Calculation (min): 28 min Charges:  OT General Charges $OT Visit: 1 Visit OT Evaluation $OT Eval Low Complexity: 1 Low OT Treatments $Self Care/Home Management :  8-22 mins  Derl Barrow, OTR/L Acute Care Rehab Services  Office [phone] Pager: [place] 12/14/2020, 12:28 PM

## 2020-12-15 ENCOUNTER — Encounter (HOSPITAL_COMMUNITY): Payer: Self-pay | Admitting: Orthopedic Surgery

## 2020-12-27 DIAGNOSIS — Z96612 Presence of left artificial shoulder joint: Secondary | ICD-10-CM | POA: Diagnosis not present

## 2020-12-27 DIAGNOSIS — Z471 Aftercare following joint replacement surgery: Secondary | ICD-10-CM | POA: Diagnosis not present

## 2020-12-28 DIAGNOSIS — Z471 Aftercare following joint replacement surgery: Secondary | ICD-10-CM | POA: Diagnosis not present

## 2020-12-28 DIAGNOSIS — Z96612 Presence of left artificial shoulder joint: Secondary | ICD-10-CM | POA: Diagnosis not present

## 2020-12-28 DIAGNOSIS — R262 Difficulty in walking, not elsewhere classified: Secondary | ICD-10-CM | POA: Diagnosis not present

## 2020-12-28 DIAGNOSIS — M25512 Pain in left shoulder: Secondary | ICD-10-CM | POA: Diagnosis not present

## 2021-01-02 DIAGNOSIS — Z471 Aftercare following joint replacement surgery: Secondary | ICD-10-CM | POA: Diagnosis not present

## 2021-01-02 DIAGNOSIS — Z96612 Presence of left artificial shoulder joint: Secondary | ICD-10-CM | POA: Diagnosis not present

## 2021-01-02 DIAGNOSIS — M25512 Pain in left shoulder: Secondary | ICD-10-CM | POA: Diagnosis not present

## 2021-01-02 DIAGNOSIS — R262 Difficulty in walking, not elsewhere classified: Secondary | ICD-10-CM | POA: Diagnosis not present

## 2021-01-04 DIAGNOSIS — Z96612 Presence of left artificial shoulder joint: Secondary | ICD-10-CM | POA: Diagnosis not present

## 2021-01-04 DIAGNOSIS — Z471 Aftercare following joint replacement surgery: Secondary | ICD-10-CM | POA: Diagnosis not present

## 2021-01-04 DIAGNOSIS — R262 Difficulty in walking, not elsewhere classified: Secondary | ICD-10-CM | POA: Diagnosis not present

## 2021-01-04 DIAGNOSIS — M25512 Pain in left shoulder: Secondary | ICD-10-CM | POA: Diagnosis not present

## 2021-01-09 DIAGNOSIS — R262 Difficulty in walking, not elsewhere classified: Secondary | ICD-10-CM | POA: Diagnosis not present

## 2021-01-09 DIAGNOSIS — Z96612 Presence of left artificial shoulder joint: Secondary | ICD-10-CM | POA: Diagnosis not present

## 2021-01-09 DIAGNOSIS — Z471 Aftercare following joint replacement surgery: Secondary | ICD-10-CM | POA: Diagnosis not present

## 2021-01-09 DIAGNOSIS — M25512 Pain in left shoulder: Secondary | ICD-10-CM | POA: Diagnosis not present

## 2021-01-11 DIAGNOSIS — Z96612 Presence of left artificial shoulder joint: Secondary | ICD-10-CM | POA: Diagnosis not present

## 2021-01-11 DIAGNOSIS — M25512 Pain in left shoulder: Secondary | ICD-10-CM | POA: Diagnosis not present

## 2021-01-11 DIAGNOSIS — Z471 Aftercare following joint replacement surgery: Secondary | ICD-10-CM | POA: Diagnosis not present

## 2021-01-11 DIAGNOSIS — R262 Difficulty in walking, not elsewhere classified: Secondary | ICD-10-CM | POA: Diagnosis not present

## 2021-01-16 DIAGNOSIS — R262 Difficulty in walking, not elsewhere classified: Secondary | ICD-10-CM | POA: Diagnosis not present

## 2021-01-16 DIAGNOSIS — Z471 Aftercare following joint replacement surgery: Secondary | ICD-10-CM | POA: Diagnosis not present

## 2021-01-16 DIAGNOSIS — Z96612 Presence of left artificial shoulder joint: Secondary | ICD-10-CM | POA: Diagnosis not present

## 2021-01-16 DIAGNOSIS — M25512 Pain in left shoulder: Secondary | ICD-10-CM | POA: Diagnosis not present

## 2021-01-18 DIAGNOSIS — Z471 Aftercare following joint replacement surgery: Secondary | ICD-10-CM | POA: Diagnosis not present

## 2021-01-18 DIAGNOSIS — M25512 Pain in left shoulder: Secondary | ICD-10-CM | POA: Diagnosis not present

## 2021-01-18 DIAGNOSIS — Z96612 Presence of left artificial shoulder joint: Secondary | ICD-10-CM | POA: Diagnosis not present

## 2021-01-18 DIAGNOSIS — R262 Difficulty in walking, not elsewhere classified: Secondary | ICD-10-CM | POA: Diagnosis not present

## 2021-01-23 DIAGNOSIS — Z96612 Presence of left artificial shoulder joint: Secondary | ICD-10-CM | POA: Diagnosis not present

## 2021-01-23 DIAGNOSIS — R262 Difficulty in walking, not elsewhere classified: Secondary | ICD-10-CM | POA: Diagnosis not present

## 2021-01-23 DIAGNOSIS — Z471 Aftercare following joint replacement surgery: Secondary | ICD-10-CM | POA: Diagnosis not present

## 2021-01-23 DIAGNOSIS — M25512 Pain in left shoulder: Secondary | ICD-10-CM | POA: Diagnosis not present

## 2021-01-25 DIAGNOSIS — Z96612 Presence of left artificial shoulder joint: Secondary | ICD-10-CM | POA: Diagnosis not present

## 2021-01-25 DIAGNOSIS — R262 Difficulty in walking, not elsewhere classified: Secondary | ICD-10-CM | POA: Diagnosis not present

## 2021-01-25 DIAGNOSIS — M25512 Pain in left shoulder: Secondary | ICD-10-CM | POA: Diagnosis not present

## 2021-01-25 DIAGNOSIS — Z471 Aftercare following joint replacement surgery: Secondary | ICD-10-CM | POA: Diagnosis not present

## 2021-01-30 DIAGNOSIS — M25512 Pain in left shoulder: Secondary | ICD-10-CM | POA: Diagnosis not present

## 2021-01-30 DIAGNOSIS — Z96612 Presence of left artificial shoulder joint: Secondary | ICD-10-CM | POA: Diagnosis not present

## 2021-01-30 DIAGNOSIS — Z471 Aftercare following joint replacement surgery: Secondary | ICD-10-CM | POA: Diagnosis not present

## 2021-01-30 DIAGNOSIS — R262 Difficulty in walking, not elsewhere classified: Secondary | ICD-10-CM | POA: Diagnosis not present

## 2021-02-01 DIAGNOSIS — R262 Difficulty in walking, not elsewhere classified: Secondary | ICD-10-CM | POA: Diagnosis not present

## 2021-02-01 DIAGNOSIS — Z471 Aftercare following joint replacement surgery: Secondary | ICD-10-CM | POA: Diagnosis not present

## 2021-02-01 DIAGNOSIS — Z96612 Presence of left artificial shoulder joint: Secondary | ICD-10-CM | POA: Diagnosis not present

## 2021-02-01 DIAGNOSIS — M25512 Pain in left shoulder: Secondary | ICD-10-CM | POA: Diagnosis not present

## 2021-02-06 DIAGNOSIS — R262 Difficulty in walking, not elsewhere classified: Secondary | ICD-10-CM | POA: Diagnosis not present

## 2021-02-06 DIAGNOSIS — Z471 Aftercare following joint replacement surgery: Secondary | ICD-10-CM | POA: Diagnosis not present

## 2021-02-06 DIAGNOSIS — M25512 Pain in left shoulder: Secondary | ICD-10-CM | POA: Diagnosis not present

## 2021-02-06 DIAGNOSIS — Z96612 Presence of left artificial shoulder joint: Secondary | ICD-10-CM | POA: Diagnosis not present

## 2021-02-08 DIAGNOSIS — Z96612 Presence of left artificial shoulder joint: Secondary | ICD-10-CM | POA: Diagnosis not present

## 2021-02-08 DIAGNOSIS — R262 Difficulty in walking, not elsewhere classified: Secondary | ICD-10-CM | POA: Diagnosis not present

## 2021-02-08 DIAGNOSIS — M25512 Pain in left shoulder: Secondary | ICD-10-CM | POA: Diagnosis not present

## 2021-02-08 DIAGNOSIS — Z471 Aftercare following joint replacement surgery: Secondary | ICD-10-CM | POA: Diagnosis not present

## 2021-02-13 DIAGNOSIS — M25512 Pain in left shoulder: Secondary | ICD-10-CM | POA: Diagnosis not present

## 2021-02-13 DIAGNOSIS — Z96612 Presence of left artificial shoulder joint: Secondary | ICD-10-CM | POA: Diagnosis not present

## 2021-02-13 DIAGNOSIS — Z471 Aftercare following joint replacement surgery: Secondary | ICD-10-CM | POA: Diagnosis not present

## 2021-02-13 DIAGNOSIS — R262 Difficulty in walking, not elsewhere classified: Secondary | ICD-10-CM | POA: Diagnosis not present

## 2021-02-15 DIAGNOSIS — Z96612 Presence of left artificial shoulder joint: Secondary | ICD-10-CM | POA: Diagnosis not present

## 2021-02-15 DIAGNOSIS — M25512 Pain in left shoulder: Secondary | ICD-10-CM | POA: Diagnosis not present

## 2021-02-15 DIAGNOSIS — Z471 Aftercare following joint replacement surgery: Secondary | ICD-10-CM | POA: Diagnosis not present

## 2021-02-15 DIAGNOSIS — R262 Difficulty in walking, not elsewhere classified: Secondary | ICD-10-CM | POA: Diagnosis not present

## 2021-02-27 DIAGNOSIS — M25512 Pain in left shoulder: Secondary | ICD-10-CM | POA: Diagnosis not present

## 2021-02-27 DIAGNOSIS — Z96612 Presence of left artificial shoulder joint: Secondary | ICD-10-CM | POA: Diagnosis not present

## 2021-02-27 DIAGNOSIS — R262 Difficulty in walking, not elsewhere classified: Secondary | ICD-10-CM | POA: Diagnosis not present

## 2021-02-27 DIAGNOSIS — Z471 Aftercare following joint replacement surgery: Secondary | ICD-10-CM | POA: Diagnosis not present

## 2021-03-02 DIAGNOSIS — R262 Difficulty in walking, not elsewhere classified: Secondary | ICD-10-CM | POA: Diagnosis not present

## 2021-03-02 DIAGNOSIS — M25512 Pain in left shoulder: Secondary | ICD-10-CM | POA: Diagnosis not present

## 2021-03-02 DIAGNOSIS — Z96612 Presence of left artificial shoulder joint: Secondary | ICD-10-CM | POA: Diagnosis not present

## 2021-03-02 DIAGNOSIS — Z471 Aftercare following joint replacement surgery: Secondary | ICD-10-CM | POA: Diagnosis not present

## 2021-03-06 DIAGNOSIS — M25512 Pain in left shoulder: Secondary | ICD-10-CM | POA: Diagnosis not present

## 2021-03-06 DIAGNOSIS — R262 Difficulty in walking, not elsewhere classified: Secondary | ICD-10-CM | POA: Diagnosis not present

## 2021-03-06 DIAGNOSIS — Z471 Aftercare following joint replacement surgery: Secondary | ICD-10-CM | POA: Diagnosis not present

## 2021-03-06 DIAGNOSIS — Z96612 Presence of left artificial shoulder joint: Secondary | ICD-10-CM | POA: Diagnosis not present

## 2021-03-08 DIAGNOSIS — R262 Difficulty in walking, not elsewhere classified: Secondary | ICD-10-CM | POA: Diagnosis not present

## 2021-03-08 DIAGNOSIS — Z96612 Presence of left artificial shoulder joint: Secondary | ICD-10-CM | POA: Diagnosis not present

## 2021-03-08 DIAGNOSIS — M25512 Pain in left shoulder: Secondary | ICD-10-CM | POA: Diagnosis not present

## 2021-03-08 DIAGNOSIS — Z471 Aftercare following joint replacement surgery: Secondary | ICD-10-CM | POA: Diagnosis not present

## 2021-03-13 DIAGNOSIS — Z96612 Presence of left artificial shoulder joint: Secondary | ICD-10-CM | POA: Diagnosis not present

## 2021-03-13 DIAGNOSIS — Z471 Aftercare following joint replacement surgery: Secondary | ICD-10-CM | POA: Diagnosis not present

## 2021-03-13 DIAGNOSIS — R262 Difficulty in walking, not elsewhere classified: Secondary | ICD-10-CM | POA: Diagnosis not present

## 2021-03-13 DIAGNOSIS — M25512 Pain in left shoulder: Secondary | ICD-10-CM | POA: Diagnosis not present

## 2021-03-14 DIAGNOSIS — Z96611 Presence of right artificial shoulder joint: Secondary | ICD-10-CM | POA: Diagnosis not present

## 2021-03-14 DIAGNOSIS — Z96612 Presence of left artificial shoulder joint: Secondary | ICD-10-CM | POA: Diagnosis not present

## 2021-03-14 DIAGNOSIS — Z471 Aftercare following joint replacement surgery: Secondary | ICD-10-CM | POA: Diagnosis not present

## 2021-03-15 DIAGNOSIS — Z96612 Presence of left artificial shoulder joint: Secondary | ICD-10-CM | POA: Diagnosis not present

## 2021-03-15 DIAGNOSIS — Z471 Aftercare following joint replacement surgery: Secondary | ICD-10-CM | POA: Diagnosis not present

## 2021-03-15 DIAGNOSIS — R262 Difficulty in walking, not elsewhere classified: Secondary | ICD-10-CM | POA: Diagnosis not present

## 2021-03-15 DIAGNOSIS — M25512 Pain in left shoulder: Secondary | ICD-10-CM | POA: Diagnosis not present

## 2021-03-22 DIAGNOSIS — R262 Difficulty in walking, not elsewhere classified: Secondary | ICD-10-CM | POA: Diagnosis not present

## 2021-03-22 DIAGNOSIS — M25512 Pain in left shoulder: Secondary | ICD-10-CM | POA: Diagnosis not present

## 2021-03-22 DIAGNOSIS — M25511 Pain in right shoulder: Secondary | ICD-10-CM | POA: Diagnosis not present

## 2021-03-22 DIAGNOSIS — Z96611 Presence of right artificial shoulder joint: Secondary | ICD-10-CM | POA: Diagnosis not present

## 2021-03-22 DIAGNOSIS — Z471 Aftercare following joint replacement surgery: Secondary | ICD-10-CM | POA: Diagnosis not present

## 2021-03-22 DIAGNOSIS — Z96612 Presence of left artificial shoulder joint: Secondary | ICD-10-CM | POA: Diagnosis not present

## 2021-03-23 DIAGNOSIS — E7849 Other hyperlipidemia: Secondary | ICD-10-CM | POA: Diagnosis not present

## 2021-03-23 DIAGNOSIS — I10 Essential (primary) hypertension: Secondary | ICD-10-CM | POA: Diagnosis not present

## 2021-03-23 DIAGNOSIS — Z1329 Encounter for screening for other suspected endocrine disorder: Secondary | ICD-10-CM | POA: Diagnosis not present

## 2021-03-23 DIAGNOSIS — E782 Mixed hyperlipidemia: Secondary | ICD-10-CM | POA: Diagnosis not present

## 2021-03-23 DIAGNOSIS — J449 Chronic obstructive pulmonary disease, unspecified: Secondary | ICD-10-CM | POA: Diagnosis not present

## 2021-03-23 DIAGNOSIS — R739 Hyperglycemia, unspecified: Secondary | ICD-10-CM | POA: Diagnosis not present

## 2021-03-27 DIAGNOSIS — M19011 Primary osteoarthritis, right shoulder: Secondary | ICD-10-CM | POA: Diagnosis not present

## 2021-03-27 DIAGNOSIS — J449 Chronic obstructive pulmonary disease, unspecified: Secondary | ICD-10-CM | POA: Diagnosis not present

## 2021-03-27 DIAGNOSIS — Z23 Encounter for immunization: Secondary | ICD-10-CM | POA: Diagnosis not present

## 2021-03-27 DIAGNOSIS — I700 Atherosclerosis of aorta: Secondary | ICD-10-CM | POA: Diagnosis not present

## 2021-03-27 DIAGNOSIS — R4582 Worries: Secondary | ICD-10-CM | POA: Diagnosis not present

## 2021-03-27 DIAGNOSIS — M19012 Primary osteoarthritis, left shoulder: Secondary | ICD-10-CM | POA: Diagnosis not present

## 2021-03-27 DIAGNOSIS — E7849 Other hyperlipidemia: Secondary | ICD-10-CM | POA: Diagnosis not present

## 2021-03-27 DIAGNOSIS — I10 Essential (primary) hypertension: Secondary | ICD-10-CM | POA: Diagnosis not present

## 2021-03-29 DIAGNOSIS — M25511 Pain in right shoulder: Secondary | ICD-10-CM | POA: Diagnosis not present

## 2021-03-29 DIAGNOSIS — Z96612 Presence of left artificial shoulder joint: Secondary | ICD-10-CM | POA: Diagnosis not present

## 2021-03-29 DIAGNOSIS — Z471 Aftercare following joint replacement surgery: Secondary | ICD-10-CM | POA: Diagnosis not present

## 2021-03-29 DIAGNOSIS — Z96611 Presence of right artificial shoulder joint: Secondary | ICD-10-CM | POA: Diagnosis not present

## 2021-03-29 DIAGNOSIS — R262 Difficulty in walking, not elsewhere classified: Secondary | ICD-10-CM | POA: Diagnosis not present

## 2021-03-29 DIAGNOSIS — M25512 Pain in left shoulder: Secondary | ICD-10-CM | POA: Diagnosis not present

## 2021-04-03 DIAGNOSIS — M25512 Pain in left shoulder: Secondary | ICD-10-CM | POA: Diagnosis not present

## 2021-04-03 DIAGNOSIS — R262 Difficulty in walking, not elsewhere classified: Secondary | ICD-10-CM | POA: Diagnosis not present

## 2021-04-03 DIAGNOSIS — Z96611 Presence of right artificial shoulder joint: Secondary | ICD-10-CM | POA: Diagnosis not present

## 2021-04-03 DIAGNOSIS — M25511 Pain in right shoulder: Secondary | ICD-10-CM | POA: Diagnosis not present

## 2021-04-03 DIAGNOSIS — Z471 Aftercare following joint replacement surgery: Secondary | ICD-10-CM | POA: Diagnosis not present

## 2021-04-03 DIAGNOSIS — Z96612 Presence of left artificial shoulder joint: Secondary | ICD-10-CM | POA: Diagnosis not present

## 2021-04-04 DIAGNOSIS — I700 Atherosclerosis of aorta: Secondary | ICD-10-CM | POA: Diagnosis not present

## 2021-04-04 DIAGNOSIS — F331 Major depressive disorder, recurrent, moderate: Secondary | ICD-10-CM | POA: Diagnosis not present

## 2021-04-04 DIAGNOSIS — Z0001 Encounter for general adult medical examination with abnormal findings: Secondary | ICD-10-CM | POA: Diagnosis not present

## 2021-04-04 DIAGNOSIS — M19011 Primary osteoarthritis, right shoulder: Secondary | ICD-10-CM | POA: Diagnosis not present

## 2021-04-04 DIAGNOSIS — J449 Chronic obstructive pulmonary disease, unspecified: Secondary | ICD-10-CM | POA: Diagnosis not present

## 2021-04-04 DIAGNOSIS — M19012 Primary osteoarthritis, left shoulder: Secondary | ICD-10-CM | POA: Diagnosis not present

## 2021-04-04 DIAGNOSIS — E7849 Other hyperlipidemia: Secondary | ICD-10-CM | POA: Diagnosis not present

## 2021-04-04 DIAGNOSIS — I10 Essential (primary) hypertension: Secondary | ICD-10-CM | POA: Diagnosis not present

## 2021-04-05 DIAGNOSIS — Z96612 Presence of left artificial shoulder joint: Secondary | ICD-10-CM | POA: Diagnosis not present

## 2021-04-05 DIAGNOSIS — Z96611 Presence of right artificial shoulder joint: Secondary | ICD-10-CM | POA: Diagnosis not present

## 2021-04-05 DIAGNOSIS — Z471 Aftercare following joint replacement surgery: Secondary | ICD-10-CM | POA: Diagnosis not present

## 2021-04-05 DIAGNOSIS — M25512 Pain in left shoulder: Secondary | ICD-10-CM | POA: Diagnosis not present

## 2021-04-05 DIAGNOSIS — R262 Difficulty in walking, not elsewhere classified: Secondary | ICD-10-CM | POA: Diagnosis not present

## 2021-04-05 DIAGNOSIS — M25511 Pain in right shoulder: Secondary | ICD-10-CM | POA: Diagnosis not present

## 2021-04-13 IMAGING — DX DG PORTABLE PELVIS
2 series · 2 of 2 positions shown · non-contrast
Comparison: None.

CLINICAL DATA: Left hip replacement

EXAM:
PORTABLE PELVIS 1-2 VIEWS

[pelvis ap (1 of 2)]
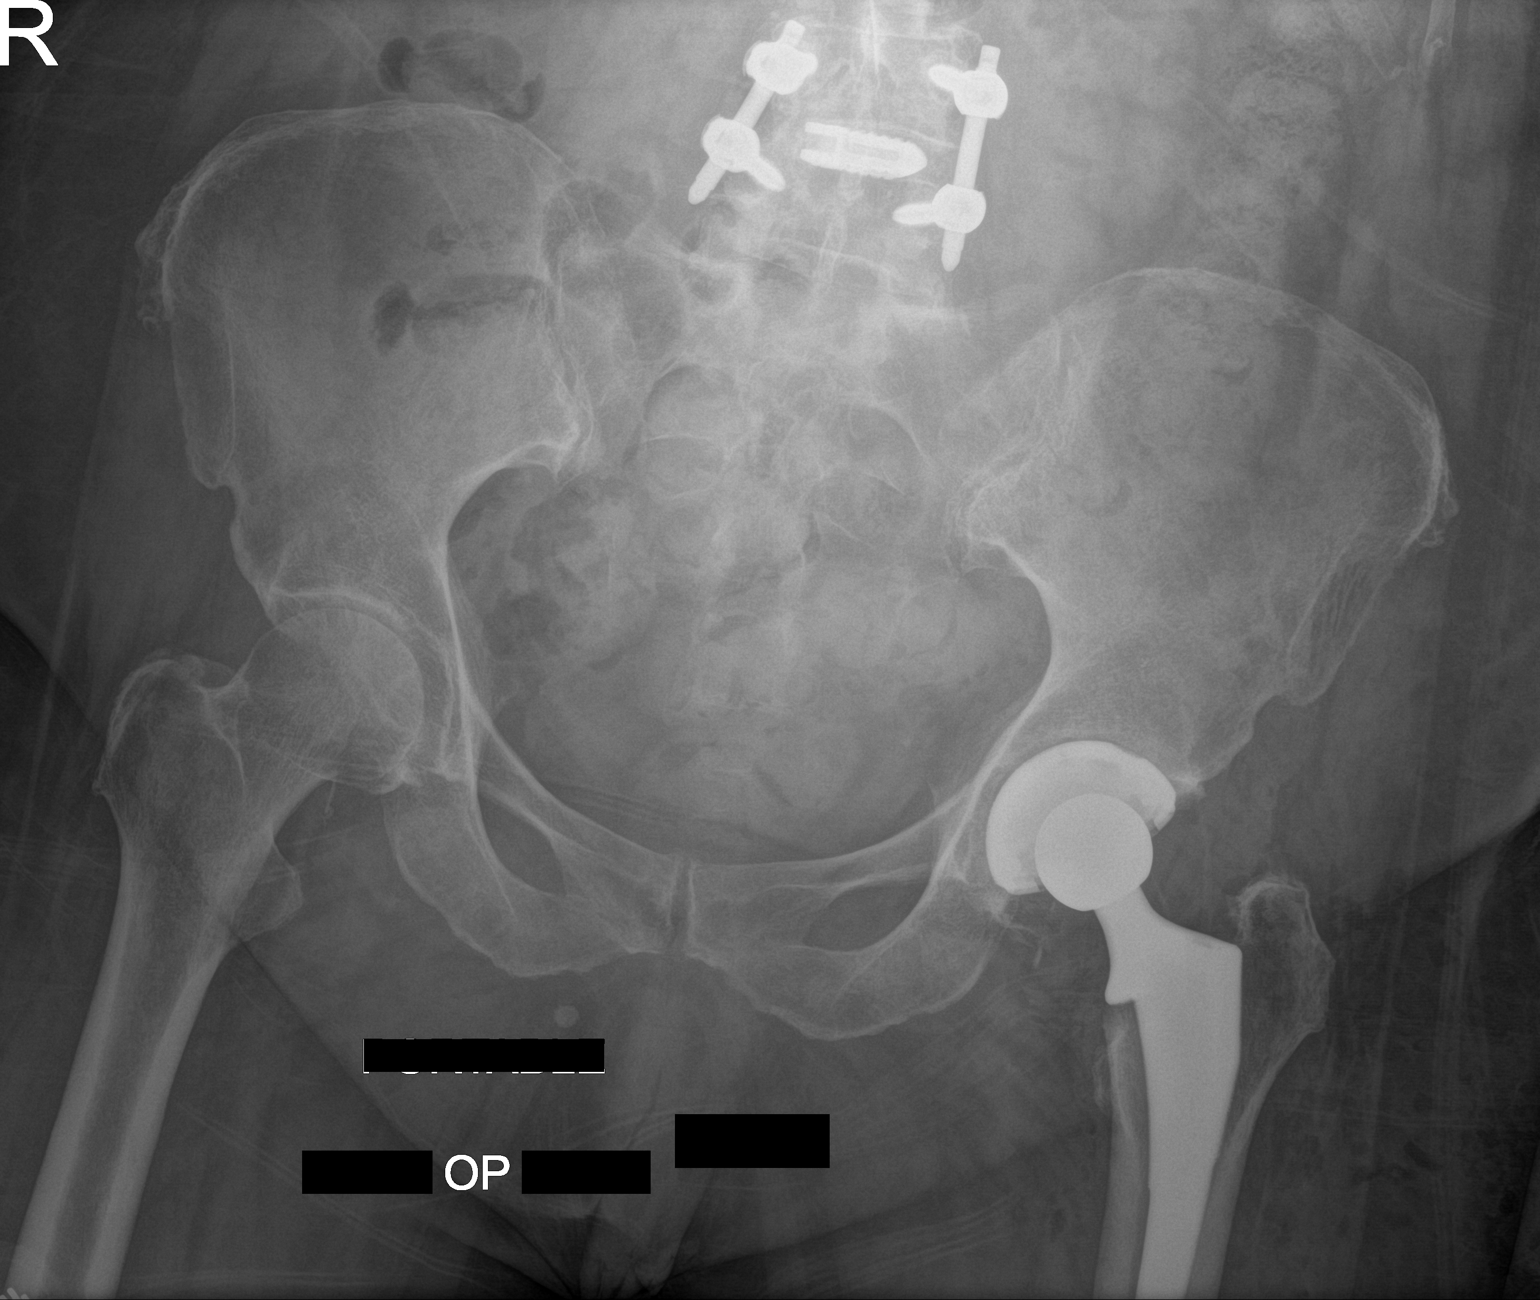

[pelvis ap (2 of 2)]
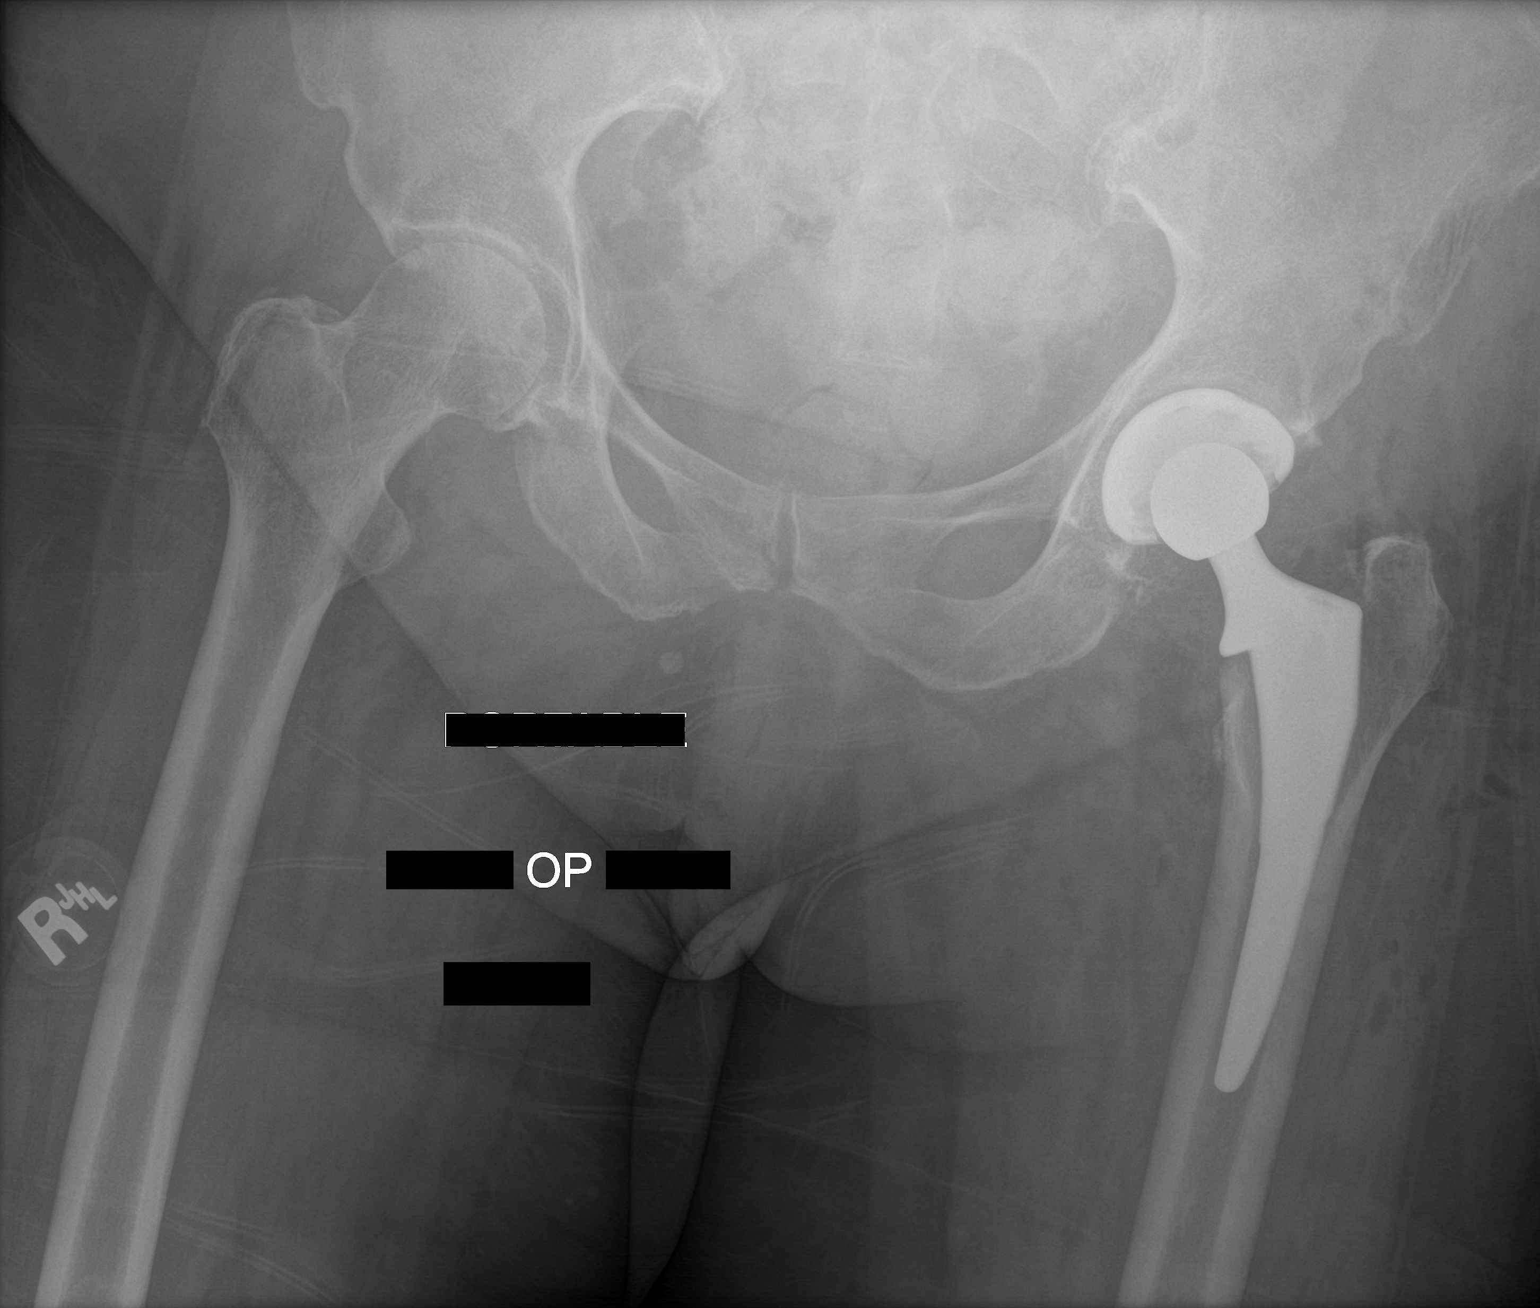

[2 of 2 positions shown; findings below may reference images not displayed]

FINDINGS: Changes of left hip replacement. Normal AP alignment. No hardware
bony complicating feature.
IMPRESSION: Left hip replacement.  No visible complicating feature.

## 2021-06-20 DIAGNOSIS — Z20828 Contact with and (suspected) exposure to other viral communicable diseases: Secondary | ICD-10-CM | POA: Diagnosis not present

## 2021-06-29 DIAGNOSIS — R609 Edema, unspecified: Secondary | ICD-10-CM | POA: Diagnosis not present

## 2021-07-19 DIAGNOSIS — I10 Essential (primary) hypertension: Secondary | ICD-10-CM | POA: Diagnosis not present

## 2021-07-19 DIAGNOSIS — E782 Mixed hyperlipidemia: Secondary | ICD-10-CM | POA: Diagnosis not present

## 2021-07-19 DIAGNOSIS — E7849 Other hyperlipidemia: Secondary | ICD-10-CM | POA: Diagnosis not present

## 2021-07-19 DIAGNOSIS — R5383 Other fatigue: Secondary | ICD-10-CM | POA: Diagnosis not present

## 2021-07-19 DIAGNOSIS — R739 Hyperglycemia, unspecified: Secondary | ICD-10-CM | POA: Diagnosis not present

## 2021-07-20 DIAGNOSIS — Z20828 Contact with and (suspected) exposure to other viral communicable diseases: Secondary | ICD-10-CM | POA: Diagnosis not present

## 2021-07-26 DIAGNOSIS — I700 Atherosclerosis of aorta: Secondary | ICD-10-CM | POA: Diagnosis not present

## 2021-07-26 DIAGNOSIS — I10 Essential (primary) hypertension: Secondary | ICD-10-CM | POA: Diagnosis not present

## 2021-07-26 DIAGNOSIS — G4700 Insomnia, unspecified: Secondary | ICD-10-CM | POA: Diagnosis not present

## 2021-07-26 DIAGNOSIS — E7849 Other hyperlipidemia: Secondary | ICD-10-CM | POA: Diagnosis not present

## 2021-07-26 DIAGNOSIS — J449 Chronic obstructive pulmonary disease, unspecified: Secondary | ICD-10-CM | POA: Diagnosis not present

## 2021-07-26 DIAGNOSIS — G2581 Restless legs syndrome: Secondary | ICD-10-CM | POA: Diagnosis not present

## 2021-07-26 DIAGNOSIS — G6289 Other specified polyneuropathies: Secondary | ICD-10-CM | POA: Diagnosis not present

## 2021-07-26 DIAGNOSIS — R4582 Worries: Secondary | ICD-10-CM | POA: Diagnosis not present

## 2021-08-18 IMAGING — DX DG SHOULDER 2+V PORT*R*
2 series · 2 of 2 positions shown · non-contrast
Comparison: Right shoulder CT 03/02/2020

CLINICAL DATA: Postop right shoulder replacement.

EXAM:
PORTABLE RIGHT SHOULDER

[shoulder ap (1 of 2)]
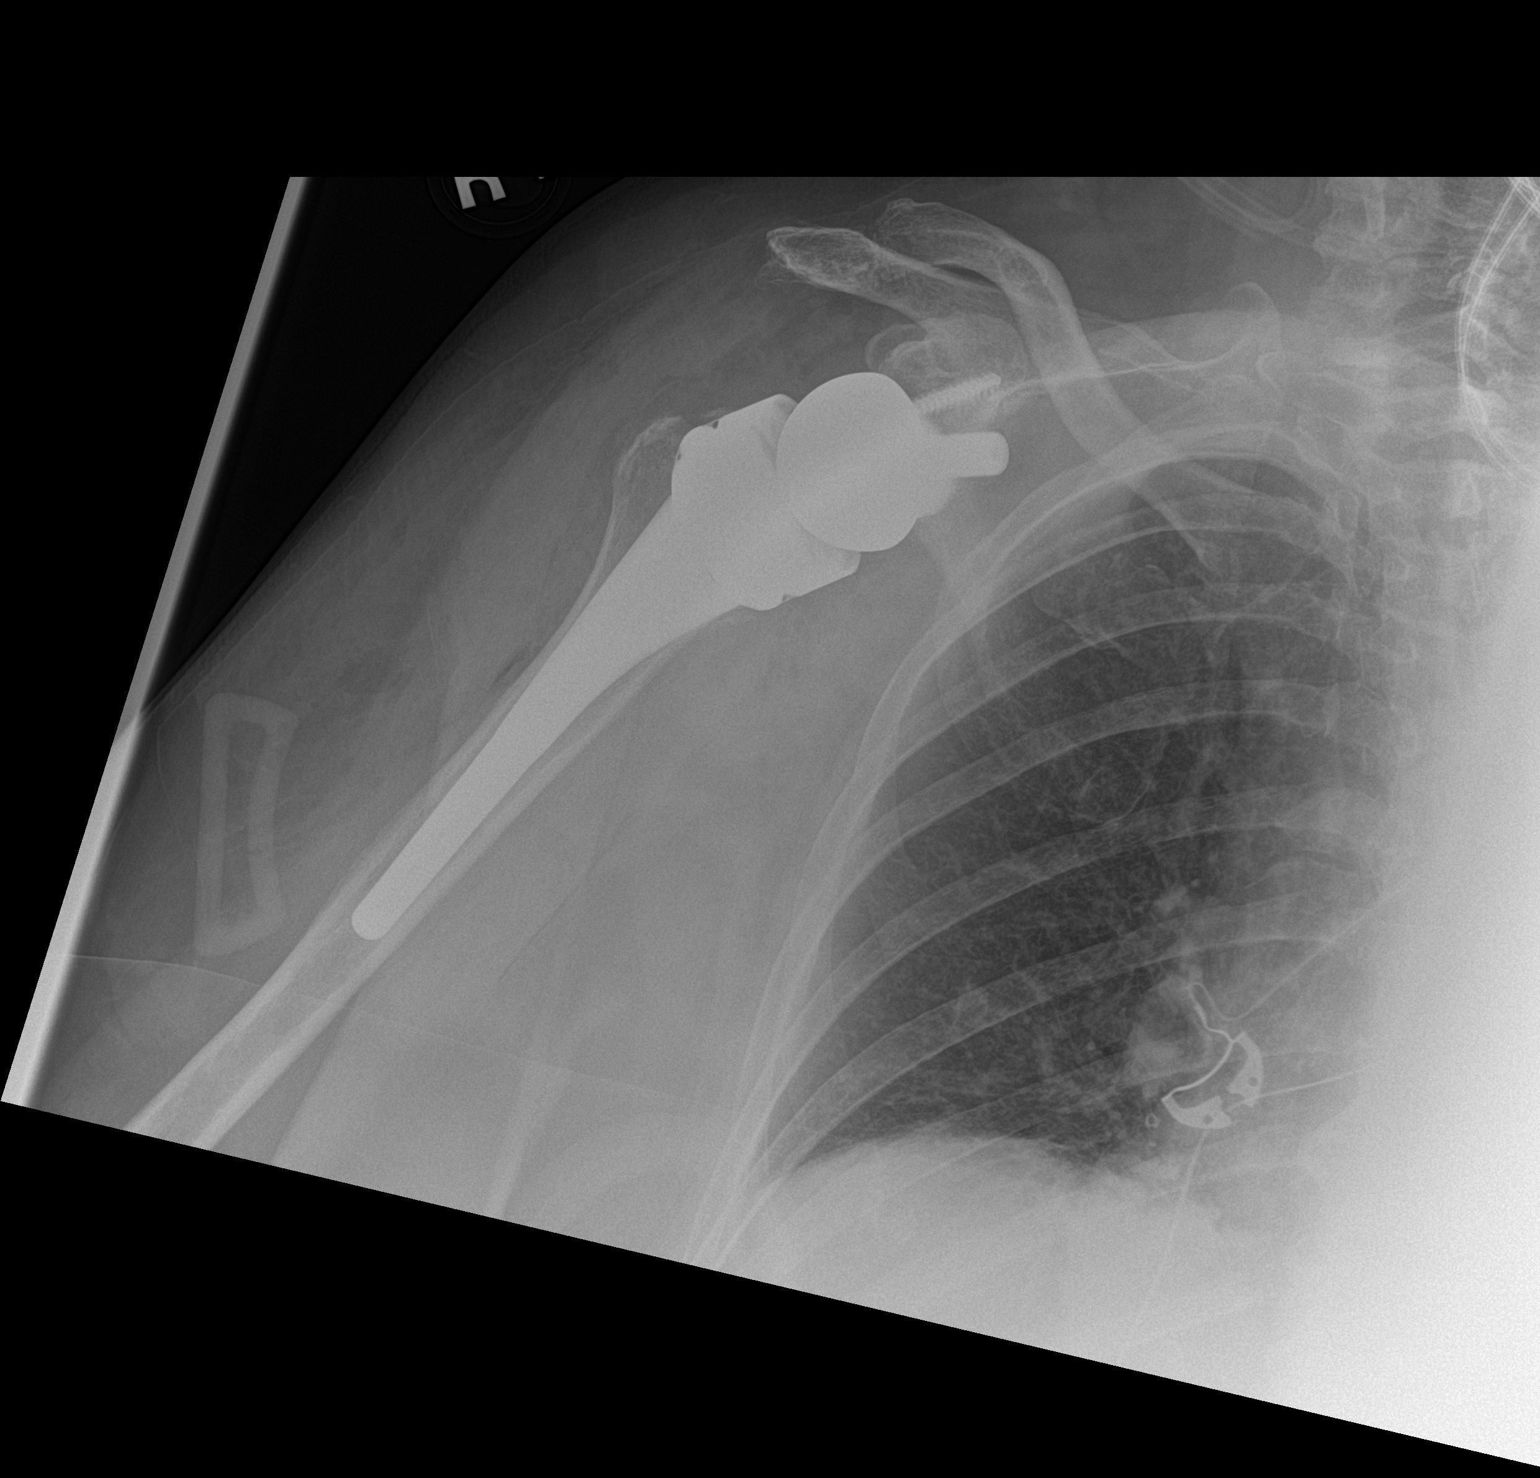

[shoulder ap (2 of 2)]
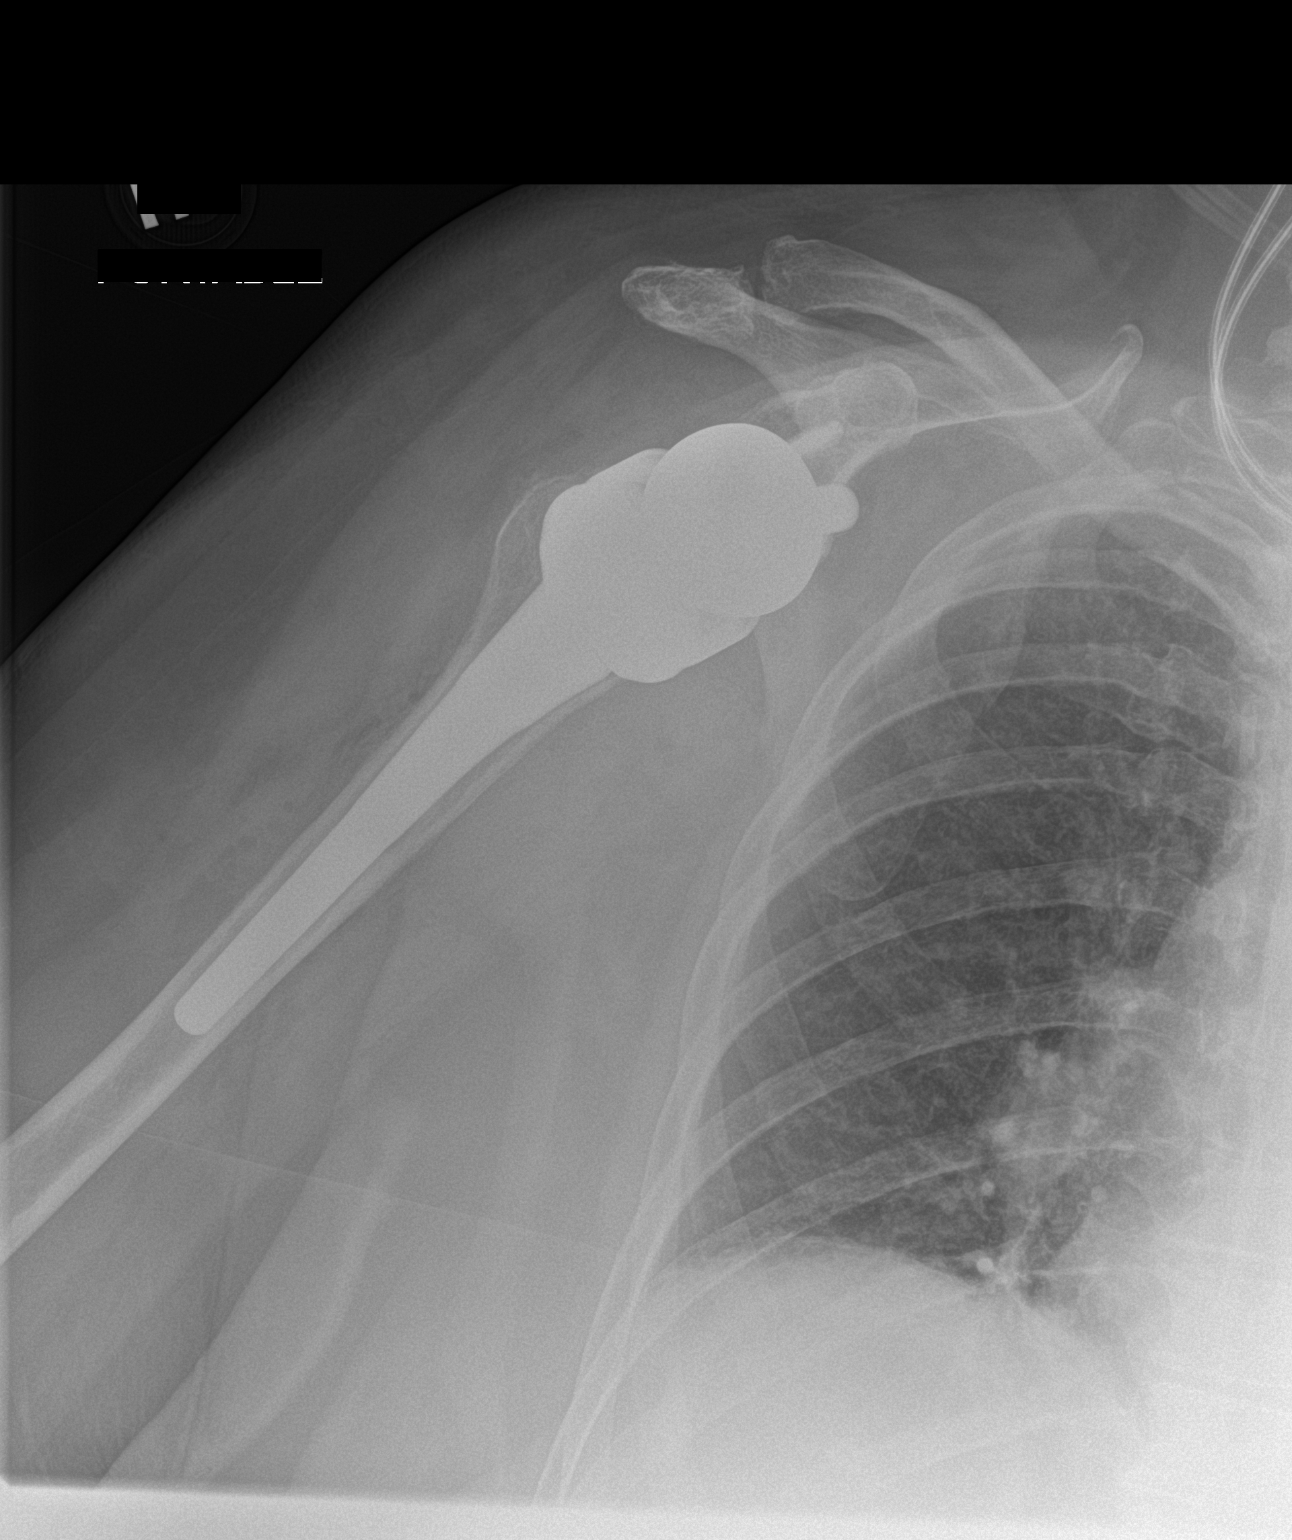

[2 of 2 positions shown; findings below may reference images not displayed]

FINDINGS: Sequelae of interval right reverse total shoulder arthroplasty are
identified. There is postoperative gas in the surrounding soft
tissues. No acute fracture or dislocation is identified on these
limited portable radiographs. Moderate acromioclavicular
osteoarthrosis is again noted.
IMPRESSION: Interval right reverse total shoulder arthroplasty.

## 2021-09-19 DIAGNOSIS — Z20828 Contact with and (suspected) exposure to other viral communicable diseases: Secondary | ICD-10-CM | POA: Diagnosis not present

## 2021-09-29 DIAGNOSIS — L03116 Cellulitis of left lower limb: Secondary | ICD-10-CM | POA: Diagnosis not present

## 2021-10-11 DIAGNOSIS — R5383 Other fatigue: Secondary | ICD-10-CM | POA: Diagnosis not present

## 2021-10-11 DIAGNOSIS — E7849 Other hyperlipidemia: Secondary | ICD-10-CM | POA: Diagnosis not present

## 2021-10-11 DIAGNOSIS — R739 Hyperglycemia, unspecified: Secondary | ICD-10-CM | POA: Diagnosis not present

## 2021-10-11 DIAGNOSIS — E782 Mixed hyperlipidemia: Secondary | ICD-10-CM | POA: Diagnosis not present

## 2021-10-11 DIAGNOSIS — I10 Essential (primary) hypertension: Secondary | ICD-10-CM | POA: Diagnosis not present

## 2021-10-18 DIAGNOSIS — I10 Essential (primary) hypertension: Secondary | ICD-10-CM | POA: Diagnosis not present

## 2021-10-18 DIAGNOSIS — J449 Chronic obstructive pulmonary disease, unspecified: Secondary | ICD-10-CM | POA: Diagnosis not present

## 2021-10-18 DIAGNOSIS — Z6841 Body Mass Index (BMI) 40.0 and over, adult: Secondary | ICD-10-CM | POA: Diagnosis not present

## 2021-10-18 DIAGNOSIS — R4582 Worries: Secondary | ICD-10-CM | POA: Diagnosis not present

## 2021-10-18 DIAGNOSIS — G4700 Insomnia, unspecified: Secondary | ICD-10-CM | POA: Diagnosis not present

## 2021-10-18 DIAGNOSIS — E7849 Other hyperlipidemia: Secondary | ICD-10-CM | POA: Diagnosis not present

## 2021-10-18 DIAGNOSIS — I700 Atherosclerosis of aorta: Secondary | ICD-10-CM | POA: Diagnosis not present

## 2021-10-18 DIAGNOSIS — G2581 Restless legs syndrome: Secondary | ICD-10-CM | POA: Diagnosis not present

## 2021-10-29 DIAGNOSIS — H40053 Ocular hypertension, bilateral: Secondary | ICD-10-CM | POA: Diagnosis not present

## 2022-01-11 DIAGNOSIS — Z1329 Encounter for screening for other suspected endocrine disorder: Secondary | ICD-10-CM | POA: Diagnosis not present

## 2022-01-11 DIAGNOSIS — R739 Hyperglycemia, unspecified: Secondary | ICD-10-CM | POA: Diagnosis not present

## 2022-01-11 DIAGNOSIS — J449 Chronic obstructive pulmonary disease, unspecified: Secondary | ICD-10-CM | POA: Diagnosis not present

## 2022-01-11 DIAGNOSIS — E7849 Other hyperlipidemia: Secondary | ICD-10-CM | POA: Diagnosis not present

## 2022-01-11 DIAGNOSIS — I10 Essential (primary) hypertension: Secondary | ICD-10-CM | POA: Diagnosis not present

## 2022-01-15 DIAGNOSIS — I700 Atherosclerosis of aorta: Secondary | ICD-10-CM | POA: Diagnosis not present

## 2022-01-15 DIAGNOSIS — G2581 Restless legs syndrome: Secondary | ICD-10-CM | POA: Diagnosis not present

## 2022-01-15 DIAGNOSIS — Z6841 Body Mass Index (BMI) 40.0 and over, adult: Secondary | ICD-10-CM | POA: Diagnosis not present

## 2022-01-15 DIAGNOSIS — M1712 Unilateral primary osteoarthritis, left knee: Secondary | ICD-10-CM | POA: Diagnosis not present

## 2022-01-15 DIAGNOSIS — M1711 Unilateral primary osteoarthritis, right knee: Secondary | ICD-10-CM | POA: Diagnosis not present

## 2022-01-15 DIAGNOSIS — M19011 Primary osteoarthritis, right shoulder: Secondary | ICD-10-CM | POA: Diagnosis not present

## 2022-01-15 DIAGNOSIS — I10 Essential (primary) hypertension: Secondary | ICD-10-CM | POA: Diagnosis not present

## 2022-01-15 DIAGNOSIS — G6289 Other specified polyneuropathies: Secondary | ICD-10-CM | POA: Diagnosis not present

## 2022-01-15 DIAGNOSIS — E7849 Other hyperlipidemia: Secondary | ICD-10-CM | POA: Diagnosis not present

## 2022-01-15 DIAGNOSIS — R4582 Worries: Secondary | ICD-10-CM | POA: Diagnosis not present

## 2022-01-15 DIAGNOSIS — M19012 Primary osteoarthritis, left shoulder: Secondary | ICD-10-CM | POA: Diagnosis not present

## 2022-01-15 DIAGNOSIS — J449 Chronic obstructive pulmonary disease, unspecified: Secondary | ICD-10-CM | POA: Diagnosis not present

## 2022-04-30 DIAGNOSIS — I10 Essential (primary) hypertension: Secondary | ICD-10-CM | POA: Diagnosis not present

## 2022-04-30 DIAGNOSIS — E7849 Other hyperlipidemia: Secondary | ICD-10-CM | POA: Diagnosis not present

## 2022-04-30 DIAGNOSIS — R739 Hyperglycemia, unspecified: Secondary | ICD-10-CM | POA: Diagnosis not present

## 2022-04-30 DIAGNOSIS — J449 Chronic obstructive pulmonary disease, unspecified: Secondary | ICD-10-CM | POA: Diagnosis not present

## 2022-04-30 DIAGNOSIS — E039 Hypothyroidism, unspecified: Secondary | ICD-10-CM | POA: Diagnosis not present

## 2022-05-03 DIAGNOSIS — G2581 Restless legs syndrome: Secondary | ICD-10-CM | POA: Diagnosis not present

## 2022-05-03 DIAGNOSIS — Z20828 Contact with and (suspected) exposure to other viral communicable diseases: Secondary | ICD-10-CM | POA: Diagnosis not present

## 2022-05-03 DIAGNOSIS — M19012 Primary osteoarthritis, left shoulder: Secondary | ICD-10-CM | POA: Diagnosis not present

## 2022-05-03 DIAGNOSIS — E7849 Other hyperlipidemia: Secondary | ICD-10-CM | POA: Diagnosis not present

## 2022-05-03 DIAGNOSIS — I10 Essential (primary) hypertension: Secondary | ICD-10-CM | POA: Diagnosis not present

## 2022-05-03 DIAGNOSIS — I700 Atherosclerosis of aorta: Secondary | ICD-10-CM | POA: Diagnosis not present

## 2022-05-03 DIAGNOSIS — G4700 Insomnia, unspecified: Secondary | ICD-10-CM | POA: Diagnosis not present

## 2022-05-03 DIAGNOSIS — J449 Chronic obstructive pulmonary disease, unspecified: Secondary | ICD-10-CM | POA: Diagnosis not present

## 2022-05-03 DIAGNOSIS — R4582 Worries: Secondary | ICD-10-CM | POA: Diagnosis not present

## 2022-05-03 DIAGNOSIS — Z0001 Encounter for general adult medical examination with abnormal findings: Secondary | ICD-10-CM | POA: Diagnosis not present

## 2022-05-03 DIAGNOSIS — Z23 Encounter for immunization: Secondary | ICD-10-CM | POA: Diagnosis not present

## 2022-05-03 DIAGNOSIS — R059 Cough, unspecified: Secondary | ICD-10-CM | POA: Diagnosis not present

## 2022-08-05 DIAGNOSIS — Z87891 Personal history of nicotine dependence: Secondary | ICD-10-CM | POA: Diagnosis not present

## 2022-08-05 DIAGNOSIS — G609 Hereditary and idiopathic neuropathy, unspecified: Secondary | ICD-10-CM | POA: Diagnosis not present

## 2022-08-05 DIAGNOSIS — R059 Cough, unspecified: Secondary | ICD-10-CM | POA: Diagnosis not present

## 2022-08-05 DIAGNOSIS — R609 Edema, unspecified: Secondary | ICD-10-CM | POA: Diagnosis not present

## 2022-08-05 DIAGNOSIS — J189 Pneumonia, unspecified organism: Secondary | ICD-10-CM | POA: Diagnosis not present

## 2022-08-05 DIAGNOSIS — E876 Hypokalemia: Secondary | ICD-10-CM | POA: Diagnosis not present

## 2022-08-05 DIAGNOSIS — E785 Hyperlipidemia, unspecified: Secondary | ICD-10-CM | POA: Diagnosis not present

## 2022-08-05 DIAGNOSIS — Z981 Arthrodesis status: Secondary | ICD-10-CM | POA: Diagnosis not present

## 2022-08-05 DIAGNOSIS — K219 Gastro-esophageal reflux disease without esophagitis: Secondary | ICD-10-CM | POA: Diagnosis not present

## 2022-08-05 DIAGNOSIS — F331 Major depressive disorder, recurrent, moderate: Secondary | ICD-10-CM | POA: Diagnosis not present

## 2022-08-05 DIAGNOSIS — M5136 Other intervertebral disc degeneration, lumbar region: Secondary | ICD-10-CM | POA: Diagnosis not present

## 2022-08-05 DIAGNOSIS — Z79899 Other long term (current) drug therapy: Secondary | ICD-10-CM | POA: Diagnosis not present

## 2022-08-05 DIAGNOSIS — J9601 Acute respiratory failure with hypoxia: Secondary | ICD-10-CM | POA: Diagnosis not present

## 2022-08-05 DIAGNOSIS — I10 Essential (primary) hypertension: Secondary | ICD-10-CM | POA: Diagnosis not present

## 2022-08-05 DIAGNOSIS — I110 Hypertensive heart disease with heart failure: Secondary | ICD-10-CM | POA: Diagnosis not present

## 2022-08-05 DIAGNOSIS — Z9049 Acquired absence of other specified parts of digestive tract: Secondary | ICD-10-CM | POA: Diagnosis not present

## 2022-08-05 DIAGNOSIS — M160 Bilateral primary osteoarthritis of hip: Secondary | ICD-10-CM | POA: Diagnosis not present

## 2022-08-05 DIAGNOSIS — Z882 Allergy status to sulfonamides status: Secondary | ICD-10-CM | POA: Diagnosis not present

## 2022-08-05 DIAGNOSIS — Z888 Allergy status to other drugs, medicaments and biological substances status: Secondary | ICD-10-CM | POA: Diagnosis not present

## 2022-08-05 DIAGNOSIS — Z791 Long term (current) use of non-steroidal anti-inflammatories (NSAID): Secondary | ICD-10-CM | POA: Diagnosis not present

## 2022-08-05 DIAGNOSIS — Z6841 Body Mass Index (BMI) 40.0 and over, adult: Secondary | ICD-10-CM | POA: Diagnosis not present

## 2022-08-05 DIAGNOSIS — R918 Other nonspecific abnormal finding of lung field: Secondary | ICD-10-CM | POA: Diagnosis not present

## 2022-08-05 DIAGNOSIS — I5033 Acute on chronic diastolic (congestive) heart failure: Secondary | ICD-10-CM | POA: Diagnosis not present

## 2022-08-05 DIAGNOSIS — I959 Hypotension, unspecified: Secondary | ICD-10-CM | POA: Diagnosis not present

## 2022-08-05 DIAGNOSIS — R0600 Dyspnea, unspecified: Secondary | ICD-10-CM | POA: Diagnosis not present

## 2022-08-05 DIAGNOSIS — R0902 Hypoxemia: Secondary | ICD-10-CM | POA: Diagnosis not present

## 2022-08-05 DIAGNOSIS — Z90710 Acquired absence of both cervix and uterus: Secondary | ICD-10-CM | POA: Diagnosis not present

## 2022-08-05 DIAGNOSIS — Z1152 Encounter for screening for COVID-19: Secondary | ICD-10-CM | POA: Diagnosis not present

## 2022-08-05 DIAGNOSIS — I509 Heart failure, unspecified: Secondary | ICD-10-CM | POA: Diagnosis not present

## 2022-08-05 DIAGNOSIS — M170 Bilateral primary osteoarthritis of knee: Secondary | ICD-10-CM | POA: Diagnosis not present

## 2022-08-05 DIAGNOSIS — Z7901 Long term (current) use of anticoagulants: Secondary | ICD-10-CM | POA: Diagnosis not present

## 2022-08-12 DIAGNOSIS — M170 Bilateral primary osteoarthritis of knee: Secondary | ICD-10-CM | POA: Diagnosis not present

## 2022-08-12 DIAGNOSIS — K219 Gastro-esophageal reflux disease without esophagitis: Secondary | ICD-10-CM | POA: Diagnosis not present

## 2022-08-12 DIAGNOSIS — M47816 Spondylosis without myelopathy or radiculopathy, lumbar region: Secondary | ICD-10-CM | POA: Diagnosis not present

## 2022-08-12 DIAGNOSIS — I110 Hypertensive heart disease with heart failure: Secondary | ICD-10-CM | POA: Diagnosis not present

## 2022-08-12 DIAGNOSIS — J9621 Acute and chronic respiratory failure with hypoxia: Secondary | ICD-10-CM | POA: Diagnosis not present

## 2022-08-12 DIAGNOSIS — E7800 Pure hypercholesterolemia, unspecified: Secondary | ICD-10-CM | POA: Diagnosis not present

## 2022-08-12 DIAGNOSIS — M4316 Spondylolisthesis, lumbar region: Secondary | ICD-10-CM | POA: Diagnosis not present

## 2022-08-12 DIAGNOSIS — J189 Pneumonia, unspecified organism: Secondary | ICD-10-CM | POA: Diagnosis not present

## 2022-08-12 DIAGNOSIS — Z791 Long term (current) use of non-steroidal anti-inflammatories (NSAID): Secondary | ICD-10-CM | POA: Diagnosis not present

## 2022-08-12 DIAGNOSIS — Z981 Arthrodesis status: Secondary | ICD-10-CM | POA: Diagnosis not present

## 2022-08-12 DIAGNOSIS — M160 Bilateral primary osteoarthritis of hip: Secondary | ICD-10-CM | POA: Diagnosis not present

## 2022-08-12 DIAGNOSIS — Z9181 History of falling: Secondary | ICD-10-CM | POA: Diagnosis not present

## 2022-08-12 DIAGNOSIS — G609 Hereditary and idiopathic neuropathy, unspecified: Secondary | ICD-10-CM | POA: Diagnosis not present

## 2022-08-12 DIAGNOSIS — I5033 Acute on chronic diastolic (congestive) heart failure: Secondary | ICD-10-CM | POA: Diagnosis not present

## 2022-08-12 DIAGNOSIS — Z79891 Long term (current) use of opiate analgesic: Secondary | ICD-10-CM | POA: Diagnosis not present

## 2022-08-14 DIAGNOSIS — J9621 Acute and chronic respiratory failure with hypoxia: Secondary | ICD-10-CM | POA: Diagnosis not present

## 2022-08-14 DIAGNOSIS — M47816 Spondylosis without myelopathy or radiculopathy, lumbar region: Secondary | ICD-10-CM | POA: Diagnosis not present

## 2022-08-14 DIAGNOSIS — I5033 Acute on chronic diastolic (congestive) heart failure: Secondary | ICD-10-CM | POA: Diagnosis not present

## 2022-08-14 DIAGNOSIS — M4316 Spondylolisthesis, lumbar region: Secondary | ICD-10-CM | POA: Diagnosis not present

## 2022-08-14 DIAGNOSIS — I110 Hypertensive heart disease with heart failure: Secondary | ICD-10-CM | POA: Diagnosis not present

## 2022-08-14 DIAGNOSIS — J189 Pneumonia, unspecified organism: Secondary | ICD-10-CM | POA: Diagnosis not present

## 2022-08-16 DIAGNOSIS — Z23 Encounter for immunization: Secondary | ICD-10-CM | POA: Diagnosis not present

## 2022-08-19 DIAGNOSIS — I110 Hypertensive heart disease with heart failure: Secondary | ICD-10-CM | POA: Diagnosis not present

## 2022-08-19 DIAGNOSIS — I5033 Acute on chronic diastolic (congestive) heart failure: Secondary | ICD-10-CM | POA: Diagnosis not present

## 2022-08-19 DIAGNOSIS — J9621 Acute and chronic respiratory failure with hypoxia: Secondary | ICD-10-CM | POA: Diagnosis not present

## 2022-08-19 DIAGNOSIS — M4316 Spondylolisthesis, lumbar region: Secondary | ICD-10-CM | POA: Diagnosis not present

## 2022-08-19 DIAGNOSIS — M47816 Spondylosis without myelopathy or radiculopathy, lumbar region: Secondary | ICD-10-CM | POA: Diagnosis not present

## 2022-08-19 DIAGNOSIS — J189 Pneumonia, unspecified organism: Secondary | ICD-10-CM | POA: Diagnosis not present

## 2022-08-21 DIAGNOSIS — J9621 Acute and chronic respiratory failure with hypoxia: Secondary | ICD-10-CM | POA: Diagnosis not present

## 2022-08-21 DIAGNOSIS — J189 Pneumonia, unspecified organism: Secondary | ICD-10-CM | POA: Diagnosis not present

## 2022-08-21 DIAGNOSIS — M47816 Spondylosis without myelopathy or radiculopathy, lumbar region: Secondary | ICD-10-CM | POA: Diagnosis not present

## 2022-08-21 DIAGNOSIS — I5033 Acute on chronic diastolic (congestive) heart failure: Secondary | ICD-10-CM | POA: Diagnosis not present

## 2022-08-21 DIAGNOSIS — I110 Hypertensive heart disease with heart failure: Secondary | ICD-10-CM | POA: Diagnosis not present

## 2022-08-21 DIAGNOSIS — M4316 Spondylolisthesis, lumbar region: Secondary | ICD-10-CM | POA: Diagnosis not present

## 2022-08-22 DIAGNOSIS — J9621 Acute and chronic respiratory failure with hypoxia: Secondary | ICD-10-CM | POA: Diagnosis not present

## 2022-08-22 DIAGNOSIS — M4316 Spondylolisthesis, lumbar region: Secondary | ICD-10-CM | POA: Diagnosis not present

## 2022-08-22 DIAGNOSIS — J189 Pneumonia, unspecified organism: Secondary | ICD-10-CM | POA: Diagnosis not present

## 2022-08-22 DIAGNOSIS — I110 Hypertensive heart disease with heart failure: Secondary | ICD-10-CM | POA: Diagnosis not present

## 2022-08-22 DIAGNOSIS — M47816 Spondylosis without myelopathy or radiculopathy, lumbar region: Secondary | ICD-10-CM | POA: Diagnosis not present

## 2022-08-22 DIAGNOSIS — I5033 Acute on chronic diastolic (congestive) heart failure: Secondary | ICD-10-CM | POA: Diagnosis not present

## 2022-08-23 DIAGNOSIS — M4316 Spondylolisthesis, lumbar region: Secondary | ICD-10-CM | POA: Diagnosis not present

## 2022-08-23 DIAGNOSIS — I5033 Acute on chronic diastolic (congestive) heart failure: Secondary | ICD-10-CM | POA: Diagnosis not present

## 2022-08-23 DIAGNOSIS — M47816 Spondylosis without myelopathy or radiculopathy, lumbar region: Secondary | ICD-10-CM | POA: Diagnosis not present

## 2022-08-23 DIAGNOSIS — J189 Pneumonia, unspecified organism: Secondary | ICD-10-CM | POA: Diagnosis not present

## 2022-08-23 DIAGNOSIS — J9621 Acute and chronic respiratory failure with hypoxia: Secondary | ICD-10-CM | POA: Diagnosis not present

## 2022-08-23 DIAGNOSIS — I110 Hypertensive heart disease with heart failure: Secondary | ICD-10-CM | POA: Diagnosis not present

## 2022-08-27 DIAGNOSIS — M4316 Spondylolisthesis, lumbar region: Secondary | ICD-10-CM | POA: Diagnosis not present

## 2022-08-27 DIAGNOSIS — J189 Pneumonia, unspecified organism: Secondary | ICD-10-CM | POA: Diagnosis not present

## 2022-08-27 DIAGNOSIS — J9621 Acute and chronic respiratory failure with hypoxia: Secondary | ICD-10-CM | POA: Diagnosis not present

## 2022-08-27 DIAGNOSIS — I5033 Acute on chronic diastolic (congestive) heart failure: Secondary | ICD-10-CM | POA: Diagnosis not present

## 2022-08-27 DIAGNOSIS — I110 Hypertensive heart disease with heart failure: Secondary | ICD-10-CM | POA: Diagnosis not present

## 2022-08-27 DIAGNOSIS — M47816 Spondylosis without myelopathy or radiculopathy, lumbar region: Secondary | ICD-10-CM | POA: Diagnosis not present

## 2022-08-28 DIAGNOSIS — M4316 Spondylolisthesis, lumbar region: Secondary | ICD-10-CM | POA: Diagnosis not present

## 2022-08-28 DIAGNOSIS — J9621 Acute and chronic respiratory failure with hypoxia: Secondary | ICD-10-CM | POA: Diagnosis not present

## 2022-08-28 DIAGNOSIS — J189 Pneumonia, unspecified organism: Secondary | ICD-10-CM | POA: Diagnosis not present

## 2022-08-28 DIAGNOSIS — I110 Hypertensive heart disease with heart failure: Secondary | ICD-10-CM | POA: Diagnosis not present

## 2022-08-28 DIAGNOSIS — I5033 Acute on chronic diastolic (congestive) heart failure: Secondary | ICD-10-CM | POA: Diagnosis not present

## 2022-08-28 DIAGNOSIS — M47816 Spondylosis without myelopathy or radiculopathy, lumbar region: Secondary | ICD-10-CM | POA: Diagnosis not present

## 2022-08-29 DIAGNOSIS — I5033 Acute on chronic diastolic (congestive) heart failure: Secondary | ICD-10-CM | POA: Diagnosis not present

## 2022-08-29 DIAGNOSIS — M47816 Spondylosis without myelopathy or radiculopathy, lumbar region: Secondary | ICD-10-CM | POA: Diagnosis not present

## 2022-08-29 DIAGNOSIS — J9621 Acute and chronic respiratory failure with hypoxia: Secondary | ICD-10-CM | POA: Diagnosis not present

## 2022-08-29 DIAGNOSIS — M4316 Spondylolisthesis, lumbar region: Secondary | ICD-10-CM | POA: Diagnosis not present

## 2022-08-29 DIAGNOSIS — I110 Hypertensive heart disease with heart failure: Secondary | ICD-10-CM | POA: Diagnosis not present

## 2022-08-29 DIAGNOSIS — J189 Pneumonia, unspecified organism: Secondary | ICD-10-CM | POA: Diagnosis not present

## 2022-08-30 DIAGNOSIS — J449 Chronic obstructive pulmonary disease, unspecified: Secondary | ICD-10-CM | POA: Diagnosis not present

## 2022-08-30 DIAGNOSIS — E039 Hypothyroidism, unspecified: Secondary | ICD-10-CM | POA: Diagnosis not present

## 2022-08-30 DIAGNOSIS — R739 Hyperglycemia, unspecified: Secondary | ICD-10-CM | POA: Diagnosis not present

## 2022-08-30 DIAGNOSIS — R5383 Other fatigue: Secondary | ICD-10-CM | POA: Diagnosis not present

## 2022-08-30 DIAGNOSIS — E7849 Other hyperlipidemia: Secondary | ICD-10-CM | POA: Diagnosis not present

## 2022-09-02 DIAGNOSIS — I5033 Acute on chronic diastolic (congestive) heart failure: Secondary | ICD-10-CM | POA: Diagnosis not present

## 2022-09-02 DIAGNOSIS — M4316 Spondylolisthesis, lumbar region: Secondary | ICD-10-CM | POA: Diagnosis not present

## 2022-09-02 DIAGNOSIS — J9621 Acute and chronic respiratory failure with hypoxia: Secondary | ICD-10-CM | POA: Diagnosis not present

## 2022-09-02 DIAGNOSIS — J189 Pneumonia, unspecified organism: Secondary | ICD-10-CM | POA: Diagnosis not present

## 2022-09-02 DIAGNOSIS — I110 Hypertensive heart disease with heart failure: Secondary | ICD-10-CM | POA: Diagnosis not present

## 2022-09-02 DIAGNOSIS — M47816 Spondylosis without myelopathy or radiculopathy, lumbar region: Secondary | ICD-10-CM | POA: Diagnosis not present

## 2022-09-06 DIAGNOSIS — M19012 Primary osteoarthritis, left shoulder: Secondary | ICD-10-CM | POA: Diagnosis not present

## 2022-09-06 DIAGNOSIS — M19011 Primary osteoarthritis, right shoulder: Secondary | ICD-10-CM | POA: Diagnosis not present

## 2022-09-06 DIAGNOSIS — I700 Atherosclerosis of aorta: Secondary | ICD-10-CM | POA: Diagnosis not present

## 2022-09-06 DIAGNOSIS — G2581 Restless legs syndrome: Secondary | ICD-10-CM | POA: Diagnosis not present

## 2022-09-06 DIAGNOSIS — M1712 Unilateral primary osteoarthritis, left knee: Secondary | ICD-10-CM | POA: Diagnosis not present

## 2022-09-06 DIAGNOSIS — I10 Essential (primary) hypertension: Secondary | ICD-10-CM | POA: Diagnosis not present

## 2022-09-06 DIAGNOSIS — J449 Chronic obstructive pulmonary disease, unspecified: Secondary | ICD-10-CM | POA: Diagnosis not present

## 2022-09-06 DIAGNOSIS — G6289 Other specified polyneuropathies: Secondary | ICD-10-CM | POA: Diagnosis not present

## 2022-09-06 DIAGNOSIS — E7849 Other hyperlipidemia: Secondary | ICD-10-CM | POA: Diagnosis not present

## 2022-09-06 DIAGNOSIS — R4582 Worries: Secondary | ICD-10-CM | POA: Diagnosis not present

## 2022-09-06 DIAGNOSIS — R059 Cough, unspecified: Secondary | ICD-10-CM | POA: Diagnosis not present

## 2022-09-06 DIAGNOSIS — M1711 Unilateral primary osteoarthritis, right knee: Secondary | ICD-10-CM | POA: Diagnosis not present

## 2022-09-08 DIAGNOSIS — Z6841 Body Mass Index (BMI) 40.0 and over, adult: Secondary | ICD-10-CM | POA: Diagnosis not present

## 2022-09-08 DIAGNOSIS — J189 Pneumonia, unspecified organism: Secondary | ICD-10-CM | POA: Diagnosis not present

## 2022-09-08 DIAGNOSIS — J449 Chronic obstructive pulmonary disease, unspecified: Secondary | ICD-10-CM | POA: Diagnosis not present

## 2022-09-08 DIAGNOSIS — Z23 Encounter for immunization: Secondary | ICD-10-CM | POA: Diagnosis not present

## 2022-09-09 DIAGNOSIS — J9621 Acute and chronic respiratory failure with hypoxia: Secondary | ICD-10-CM | POA: Diagnosis not present

## 2022-09-09 DIAGNOSIS — M47816 Spondylosis without myelopathy or radiculopathy, lumbar region: Secondary | ICD-10-CM | POA: Diagnosis not present

## 2022-09-09 DIAGNOSIS — I110 Hypertensive heart disease with heart failure: Secondary | ICD-10-CM | POA: Diagnosis not present

## 2022-09-09 DIAGNOSIS — I5033 Acute on chronic diastolic (congestive) heart failure: Secondary | ICD-10-CM | POA: Diagnosis not present

## 2022-09-09 DIAGNOSIS — M4316 Spondylolisthesis, lumbar region: Secondary | ICD-10-CM | POA: Diagnosis not present

## 2022-09-09 DIAGNOSIS — J189 Pneumonia, unspecified organism: Secondary | ICD-10-CM | POA: Diagnosis not present

## 2022-09-10 DIAGNOSIS — M47816 Spondylosis without myelopathy or radiculopathy, lumbar region: Secondary | ICD-10-CM | POA: Diagnosis not present

## 2022-09-10 DIAGNOSIS — I110 Hypertensive heart disease with heart failure: Secondary | ICD-10-CM | POA: Diagnosis not present

## 2022-09-10 DIAGNOSIS — M160 Bilateral primary osteoarthritis of hip: Secondary | ICD-10-CM | POA: Diagnosis not present

## 2022-09-10 DIAGNOSIS — M170 Bilateral primary osteoarthritis of knee: Secondary | ICD-10-CM | POA: Diagnosis not present

## 2022-09-10 DIAGNOSIS — M4316 Spondylolisthesis, lumbar region: Secondary | ICD-10-CM | POA: Diagnosis not present

## 2022-09-10 DIAGNOSIS — J189 Pneumonia, unspecified organism: Secondary | ICD-10-CM | POA: Diagnosis not present

## 2022-09-10 DIAGNOSIS — I5033 Acute on chronic diastolic (congestive) heart failure: Secondary | ICD-10-CM | POA: Diagnosis not present

## 2022-09-10 DIAGNOSIS — J9621 Acute and chronic respiratory failure with hypoxia: Secondary | ICD-10-CM | POA: Diagnosis not present

## 2022-10-06 DIAGNOSIS — E782 Mixed hyperlipidemia: Secondary | ICD-10-CM | POA: Diagnosis not present

## 2022-10-06 DIAGNOSIS — J449 Chronic obstructive pulmonary disease, unspecified: Secondary | ICD-10-CM | POA: Diagnosis not present

## 2022-10-31 DIAGNOSIS — H43393 Other vitreous opacities, bilateral: Secondary | ICD-10-CM | POA: Diagnosis not present

## 2023-01-08 DIAGNOSIS — R739 Hyperglycemia, unspecified: Secondary | ICD-10-CM | POA: Diagnosis not present

## 2023-01-08 DIAGNOSIS — E7849 Other hyperlipidemia: Secondary | ICD-10-CM | POA: Diagnosis not present

## 2023-01-08 DIAGNOSIS — I10 Essential (primary) hypertension: Secondary | ICD-10-CM | POA: Diagnosis not present

## 2023-01-08 DIAGNOSIS — J449 Chronic obstructive pulmonary disease, unspecified: Secondary | ICD-10-CM | POA: Diagnosis not present

## 2023-01-14 DIAGNOSIS — J449 Chronic obstructive pulmonary disease, unspecified: Secondary | ICD-10-CM | POA: Diagnosis not present

## 2023-01-14 DIAGNOSIS — Z23 Encounter for immunization: Secondary | ICD-10-CM | POA: Diagnosis not present

## 2023-01-14 DIAGNOSIS — R059 Cough, unspecified: Secondary | ICD-10-CM | POA: Diagnosis not present

## 2023-01-14 DIAGNOSIS — I700 Atherosclerosis of aorta: Secondary | ICD-10-CM | POA: Diagnosis not present

## 2023-01-14 DIAGNOSIS — G2581 Restless legs syndrome: Secondary | ICD-10-CM | POA: Diagnosis not present

## 2023-01-14 DIAGNOSIS — M1712 Unilateral primary osteoarthritis, left knee: Secondary | ICD-10-CM | POA: Diagnosis not present

## 2023-01-14 DIAGNOSIS — G6289 Other specified polyneuropathies: Secondary | ICD-10-CM | POA: Diagnosis not present

## 2023-01-14 DIAGNOSIS — R4582 Worries: Secondary | ICD-10-CM | POA: Diagnosis not present

## 2023-01-14 DIAGNOSIS — I5030 Unspecified diastolic (congestive) heart failure: Secondary | ICD-10-CM | POA: Diagnosis not present

## 2023-01-14 DIAGNOSIS — E7849 Other hyperlipidemia: Secondary | ICD-10-CM | POA: Diagnosis not present

## 2023-01-14 DIAGNOSIS — I10 Essential (primary) hypertension: Secondary | ICD-10-CM | POA: Diagnosis not present

## 2023-01-14 DIAGNOSIS — M1711 Unilateral primary osteoarthritis, right knee: Secondary | ICD-10-CM | POA: Diagnosis not present

## 2023-01-29 DIAGNOSIS — M79674 Pain in right toe(s): Secondary | ICD-10-CM | POA: Diagnosis not present

## 2023-01-29 DIAGNOSIS — L110 Acquired keratosis follicularis: Secondary | ICD-10-CM | POA: Diagnosis not present

## 2023-01-29 DIAGNOSIS — M79675 Pain in left toe(s): Secondary | ICD-10-CM | POA: Diagnosis not present

## 2023-01-29 DIAGNOSIS — M79672 Pain in left foot: Secondary | ICD-10-CM | POA: Diagnosis not present

## 2023-01-29 DIAGNOSIS — I739 Peripheral vascular disease, unspecified: Secondary | ICD-10-CM | POA: Diagnosis not present

## 2023-01-29 DIAGNOSIS — M79671 Pain in right foot: Secondary | ICD-10-CM | POA: Diagnosis not present

## 2023-02-04 DIAGNOSIS — Z90710 Acquired absence of both cervix and uterus: Secondary | ICD-10-CM | POA: Diagnosis not present

## 2023-02-04 DIAGNOSIS — I517 Cardiomegaly: Secondary | ICD-10-CM | POA: Diagnosis not present

## 2023-02-04 DIAGNOSIS — I5022 Chronic systolic (congestive) heart failure: Secondary | ICD-10-CM | POA: Diagnosis not present

## 2023-04-14 DIAGNOSIS — E7849 Other hyperlipidemia: Secondary | ICD-10-CM | POA: Diagnosis not present

## 2023-04-14 DIAGNOSIS — Z1329 Encounter for screening for other suspected endocrine disorder: Secondary | ICD-10-CM | POA: Diagnosis not present

## 2023-04-14 DIAGNOSIS — J449 Chronic obstructive pulmonary disease, unspecified: Secondary | ICD-10-CM | POA: Diagnosis not present

## 2023-04-14 DIAGNOSIS — R739 Hyperglycemia, unspecified: Secondary | ICD-10-CM | POA: Diagnosis not present

## 2023-04-14 DIAGNOSIS — Z1321 Encounter for screening for nutritional disorder: Secondary | ICD-10-CM | POA: Diagnosis not present

## 2023-04-14 DIAGNOSIS — I10 Essential (primary) hypertension: Secondary | ICD-10-CM | POA: Diagnosis not present

## 2023-04-22 DIAGNOSIS — M79674 Pain in right toe(s): Secondary | ICD-10-CM | POA: Diagnosis not present

## 2023-04-22 DIAGNOSIS — L110 Acquired keratosis follicularis: Secondary | ICD-10-CM | POA: Diagnosis not present

## 2023-04-22 DIAGNOSIS — I739 Peripheral vascular disease, unspecified: Secondary | ICD-10-CM | POA: Diagnosis not present

## 2023-04-22 DIAGNOSIS — M79675 Pain in left toe(s): Secondary | ICD-10-CM | POA: Diagnosis not present

## 2023-04-22 DIAGNOSIS — M79672 Pain in left foot: Secondary | ICD-10-CM | POA: Diagnosis not present

## 2023-04-22 DIAGNOSIS — M79671 Pain in right foot: Secondary | ICD-10-CM | POA: Diagnosis not present

## 2023-04-24 DIAGNOSIS — E7849 Other hyperlipidemia: Secondary | ICD-10-CM | POA: Diagnosis not present

## 2023-04-24 DIAGNOSIS — M19012 Primary osteoarthritis, left shoulder: Secondary | ICD-10-CM | POA: Diagnosis not present

## 2023-04-24 DIAGNOSIS — R059 Cough, unspecified: Secondary | ICD-10-CM | POA: Diagnosis not present

## 2023-04-24 DIAGNOSIS — J449 Chronic obstructive pulmonary disease, unspecified: Secondary | ICD-10-CM | POA: Diagnosis not present

## 2023-04-24 DIAGNOSIS — R4582 Worries: Secondary | ICD-10-CM | POA: Diagnosis not present

## 2023-04-24 DIAGNOSIS — M19011 Primary osteoarthritis, right shoulder: Secondary | ICD-10-CM | POA: Diagnosis not present

## 2023-04-24 DIAGNOSIS — I10 Essential (primary) hypertension: Secondary | ICD-10-CM | POA: Diagnosis not present

## 2023-04-24 DIAGNOSIS — G2581 Restless legs syndrome: Secondary | ICD-10-CM | POA: Diagnosis not present

## 2023-04-24 DIAGNOSIS — I700 Atherosclerosis of aorta: Secondary | ICD-10-CM | POA: Diagnosis not present

## 2023-04-24 DIAGNOSIS — I5030 Unspecified diastolic (congestive) heart failure: Secondary | ICD-10-CM | POA: Diagnosis not present

## 2023-04-24 DIAGNOSIS — Z0001 Encounter for general adult medical examination with abnormal findings: Secondary | ICD-10-CM | POA: Diagnosis not present

## 2023-04-24 DIAGNOSIS — Z23 Encounter for immunization: Secondary | ICD-10-CM | POA: Diagnosis not present

## 2023-05-21 DIAGNOSIS — Z1329 Encounter for screening for other suspected endocrine disorder: Secondary | ICD-10-CM | POA: Diagnosis not present

## 2023-05-21 DIAGNOSIS — G622 Polyneuropathy due to other toxic agents: Secondary | ICD-10-CM | POA: Diagnosis not present

## 2023-05-21 DIAGNOSIS — R5383 Other fatigue: Secondary | ICD-10-CM | POA: Diagnosis not present

## 2023-05-21 DIAGNOSIS — E039 Hypothyroidism, unspecified: Secondary | ICD-10-CM | POA: Diagnosis not present

## 2023-05-28 DIAGNOSIS — I10 Essential (primary) hypertension: Secondary | ICD-10-CM | POA: Diagnosis not present

## 2023-05-28 DIAGNOSIS — Z6841 Body Mass Index (BMI) 40.0 and over, adult: Secondary | ICD-10-CM | POA: Diagnosis not present

## 2023-05-28 DIAGNOSIS — R0789 Other chest pain: Secondary | ICD-10-CM | POA: Diagnosis not present

## 2023-05-28 DIAGNOSIS — I700 Atherosclerosis of aorta: Secondary | ICD-10-CM | POA: Diagnosis not present

## 2023-05-28 DIAGNOSIS — E785 Hyperlipidemia, unspecified: Secondary | ICD-10-CM | POA: Diagnosis not present

## 2023-05-28 DIAGNOSIS — I708 Atherosclerosis of other arteries: Secondary | ICD-10-CM | POA: Diagnosis not present

## 2023-05-28 DIAGNOSIS — I5033 Acute on chronic diastolic (congestive) heart failure: Secondary | ICD-10-CM | POA: Diagnosis not present

## 2023-05-28 DIAGNOSIS — R0600 Dyspnea, unspecified: Secondary | ICD-10-CM | POA: Diagnosis not present

## 2023-05-28 DIAGNOSIS — I517 Cardiomegaly: Secondary | ICD-10-CM | POA: Diagnosis not present

## 2023-07-17 DIAGNOSIS — I739 Peripheral vascular disease, unspecified: Secondary | ICD-10-CM | POA: Diagnosis not present

## 2023-07-17 DIAGNOSIS — M79674 Pain in right toe(s): Secondary | ICD-10-CM | POA: Diagnosis not present

## 2023-07-17 DIAGNOSIS — L110 Acquired keratosis follicularis: Secondary | ICD-10-CM | POA: Diagnosis not present

## 2023-07-17 DIAGNOSIS — M79675 Pain in left toe(s): Secondary | ICD-10-CM | POA: Diagnosis not present

## 2023-07-17 DIAGNOSIS — M79672 Pain in left foot: Secondary | ICD-10-CM | POA: Diagnosis not present

## 2023-07-17 DIAGNOSIS — M79671 Pain in right foot: Secondary | ICD-10-CM | POA: Diagnosis not present

## 2023-08-12 DIAGNOSIS — R079 Chest pain, unspecified: Secondary | ICD-10-CM | POA: Diagnosis not present

## 2023-08-12 DIAGNOSIS — R0789 Other chest pain: Secondary | ICD-10-CM | POA: Diagnosis not present

## 2023-08-12 DIAGNOSIS — R0600 Dyspnea, unspecified: Secondary | ICD-10-CM | POA: Diagnosis not present

## 2023-09-02 DIAGNOSIS — I10 Essential (primary) hypertension: Secondary | ICD-10-CM | POA: Diagnosis not present

## 2023-09-02 DIAGNOSIS — E782 Mixed hyperlipidemia: Secondary | ICD-10-CM | POA: Diagnosis not present

## 2023-09-02 DIAGNOSIS — J449 Chronic obstructive pulmonary disease, unspecified: Secondary | ICD-10-CM | POA: Diagnosis not present

## 2023-09-02 DIAGNOSIS — E7849 Other hyperlipidemia: Secondary | ICD-10-CM | POA: Diagnosis not present

## 2023-09-02 DIAGNOSIS — R739 Hyperglycemia, unspecified: Secondary | ICD-10-CM | POA: Diagnosis not present

## 2023-09-09 DIAGNOSIS — I10 Essential (primary) hypertension: Secondary | ICD-10-CM | POA: Diagnosis not present

## 2023-09-09 DIAGNOSIS — E782 Mixed hyperlipidemia: Secondary | ICD-10-CM | POA: Diagnosis not present

## 2023-09-09 DIAGNOSIS — Z6841 Body Mass Index (BMI) 40.0 and over, adult: Secondary | ICD-10-CM | POA: Diagnosis not present

## 2023-09-09 DIAGNOSIS — F331 Major depressive disorder, recurrent, moderate: Secondary | ICD-10-CM | POA: Diagnosis not present

## 2023-09-09 DIAGNOSIS — J449 Chronic obstructive pulmonary disease, unspecified: Secondary | ICD-10-CM | POA: Diagnosis not present

## 2023-09-19 ENCOUNTER — Ambulatory Visit (HOSPITAL_COMMUNITY)
Admission: RE | Admit: 2023-09-19 | Discharge: 2023-09-19 | Disposition: A | Discharge status: Home / Self Care | Source: Ambulatory Visit | Attending: Internal Medicine | Admitting: Internal Medicine

## 2023-09-19 ENCOUNTER — Encounter: Payer: Self-pay | Admitting: Internal Medicine

## 2023-09-19 ENCOUNTER — Ambulatory Visit: Admitting: Internal Medicine

## 2023-09-19 VITALS — BP 120/72 | HR 68 | Ht 64.5 in | Wt 271.4 lb

## 2023-09-19 DIAGNOSIS — R0609 Other forms of dyspnea: Secondary | ICD-10-CM

## 2023-09-19 DIAGNOSIS — Z96611 Presence of right artificial shoulder joint: Secondary | ICD-10-CM | POA: Diagnosis not present

## 2023-09-19 DIAGNOSIS — Z96612 Presence of left artificial shoulder joint: Secondary | ICD-10-CM | POA: Diagnosis not present

## 2023-09-19 NOTE — Assessment & Plan Note (Signed)
 Onset after admit Jan 2024 dx :  CAP  - 09/19/2023   Walked on RA  x  1  lap(s) =  approx 150  ft  @ very slow/ cane pace, stopped due to sob  with lowest 02 sats 92%    Symptoms are markedly disproportionate to objective findings and not clear to what extent this is actually a pulmonary  problem but pt does appear to have difficult to sort out respiratory symptoms of unknown origin for which  DDX  = almost all start with A and  include Adherence, Ace Inhibitors, Acid Reflux, Active Sinus Disease, Alpha 1 Antitripsin deficiency, Anxiety masquerading as Airways dz,  ABPA,  Allergy(esp in young), Aspiration (esp in elderly), Adverse effects of meds,  Active smoking or Vaping, A bunch of PE's/clot burden (a few small clots can't cause this syndrome unless there is already severe underlying pulm or vascular dz with poor reserve),  Anemia or thyroid disorder, plus two Bs  = Bronchiectasis and Beta blocker use..and one C= CHF    Adherence is always the initial "prime suspect" and is a multilayered concern that requires a "trust but verify" approach in every patient - starting with knowing how to use medications, especially inhalers, correctly, keeping up with refills and understanding the fundamental difference between maintenance and prns vs those medications only taken for a very short course and then stopped and not refilled.  - return with all meds in hand using a trust but verify approach to confirm accurate Medication  Reconciliation The principal here is that until we are certain that the  patients are doing what we've asked, it makes no sense to ask them to do more.    BOLDED dx's of concern but can largely be ruled out by labs done already or today   ? Allergy / asthma > she doesn't think trelegy has helped so ok to try off to see what change if any in ex tol or need for saba: Re SABA :  I spent extra time with pt today reviewing appropriate use of albuterol for prn use on exertion with the following  points: 1) saba is for relief of sob that does not improve by walking a slower pace or resting but rather if the pt does not improve after trying this first. 2) If the pt is convinced, as many are, that saba helps recover from activity faster then it's easy to tell if this is the case by re-challenging : ie stop, take the inhaler, then p 5 minutes try the exact same activity (intensity of workload) that just caused the symptoms and see if they are substantially diminished or not after saba 3) if there is an activity that reproducibly causes the symptoms, try the saba 15 min before the activity on alternate days   If in fact the saba really does help, then fine to continue to use it prn but advised may need to look closer at the maintenance regimen (trelegy vs nothing)  being used to achieve better control of airways disease with exertion.   >>> pfts off trelegy on return    ? Anxiety /depression/ deconditioning > usually at the bottom of this list of usual suspects but   may interfere with adherence and also interpretation of response or lack thereof to symptom management which can be quite subjective.  >>> defer to PCP    F/u in 4 weeks planned

## 2023-09-19 NOTE — Assessment & Plan Note (Addendum)
 Body mass index is 45.87 kg/m.    Lab Results  Component Value Date   TSH 0.30 (L) 11/06/2007    Baseline wt 250 prior to admit and after than never the same activity tol  -  271 @ today 's eval   Contributing to doe and risk of GERD/dvt/ PE  >>>   reviewed the need and the process to achieve and maintain neg calorie balance > defer f/u primary care including intermittently monitoring thyroid status      Each maintenance medication was reviewed in detail including emphasizing most importantly the difference between maintenance and prns and under what circumstances the prns are to be triggered using an action plan format where appropriate.  Total time for H and P, chart review, counseling, reviewing hfa/dpi/neb device(s) , directly observing portions of ambulatory 02 saturation study/ and generating customized AVS unique to this office visit / same day charting =  63 min new pt with refractory resp cc of ? Origin

## 2023-09-19 NOTE — Patient Instructions (Addendum)
 Ok to try albuterol 15 min before an activity (on alternating days)  that you know would usually make you short of breath (other than bending over) and see if it makes any difference and if makes none then don't take albuterol after activity unless you can't catch your breath as this means it's the resting that helps, not the albuterol.  We will walk you today to see if your need oxygen with exertion.   Please remember to go to the lab department   for your tests - we will call you with the results when they are available.      Please remember to go to the  x-ray department  @  [facility] for your tests - we will call you with the results when they are available     My office will be contacting you by phone for referral for PFTs at [facility]   - if you don't hear back from my office within one week please call us back or notify us thru MyChart and we'll address it right away.   Please schedule a follow up office visit in 4 weeks, sooner if needed  with all medications /inhalers/ solutions in hand so we can verify exactly what you are taking. This includes all medications from all doctors and over the counters

## 2023-09-19 NOTE — Progress Notes (Signed)
 BRICE POTTEIGER, female    DOB: [birth year]    MRN: [id number]   Brief patient profile:  83  yowf  quit smoking 1992  referred to pulmonary clinic in [place]  09/19/2023 by Dr Reuel Boom  for doe since  admit   Baseline using cane for balance/ back and hip problem @ wt 250  Had nebulizer not using pre- admit   Admit date: 08/05/2022 Discharge date: 08/09/2022   Discharge Diagnoses  Acute respiratory failure with hypoxia (CMS-HCC) Hypertension GERD (gastroesophageal reflux disease) Pain of both hip joints Hypercholesterolemia Body mass index 39.0-39.9, adult Lumbar and sacral spondyloarthritis Left ventricular 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 On the day of admission her symptoms worsen. She presented to the ER x-ray revealed multifocal pneumonia. Her oxygen dipped down into the 80s. She required supplemental oxygen. She was started on Rocephin and IV Zithromax as she spiked a temperature as well. She had been up-to-date on flu shots RSV and pneumonia shots. There was some concern about congestive heart failure and she was given a dose of IV Lasix. Because these factors she required more than 2 midnights in the hospital. During hospitalization she was treated with IV Rocephin Zithromax and Decadron. She was treated with DuoNeb. Since that time she is improved dramatically with decreased sputum production. She has required O2 at times. We will need to see if she requires O2 before she goes home. Will arrange for home physical therapy and Occupational Therapy if needed. She will continue her usual  medications. We will call with a follow-up appointment in office. She will resume her other medications.     History of Present Illness  09/19/2023  Pulmonary/ 1st office eval/ April Carlyon / [place] [facility]  Chief Complaint  Patient presents with   Consult    Consults having trouble with breathing at rest and walking   Dyspnea:  10 ft assoc /assoc fluid  Cough: no  Sleep: lift chair x 30 degrees  SABA use: not helping including nebulizer  02:none      No obvious day to day or daytime pattern/variability or assoc excess/ purulent sputum or mucus plugs or hemoptysis or cp or chest tightness, subjective wheeze or overt sinus or hb symptoms.    Also denies any obvious fluctuation of symptoms with weather or environmental changes or other aggravating or alleviating factors except as outlined above   No unusual exposure hx or h/o childhood pna/ asthma or knowledge of premature birth.  Current Allergies, Complete Past Medical History, Past Surgical History, Family History, and Social History were reviewed in [employer] record.  ROS  The following are not active complaints unless bolded Hoarseness, sore throat, dysphagia, dental problems, itching, sneezing,  nasal congestion or discharge of excess mucus or purulent secretions, ear ache,   fever, chills, sweats, unintended wt loss or wt gain, classically pleuritic or exertional cp,  orthopnea pnd or arm/hand swelling  or leg swelling, presyncope, palpitations, abdominal pain, anorexia, nausea, vomiting, diarrhea  or change in bowel habits or change in bladder habits, change in stools or change in urine, dysuria, hematuria,  rash, arthralgias, visual complaints, headache, numbness, weakness  or ataxia or problems with walking or coordination,  change in mood or  memory.            Outpatient Medications Prior to Visit  Medication Sig Dispense Refill   atenolol (TENORMIN) 50 MG tablet Take 50 mg by mouth every evening.      atorvastatin (LIPITOR) 20 MG tablet Take 20 mg by mouth at bedtime.     baclofen (LIORESAL) 10 MG tablet Take 10 mg by mouth 3 (three) times daily.     gabapentin (NEURONTIN) 600 MG tablet Take 600-1,200 mg by mouth See admin instructions. Take 600 mg in the evening, and 1200 mg at bedtime     ipratropium-albuterol (DUONEB) 0.5-2.5 (3) MG/3ML SOLN Take 3 mLs by nebulization.     JARDIANCE 10 MG TABS tablet Take 10 mg by mouth daily.     losartan (COZAAR) 25 MG tablet Take 25 mg by mouth daily.     meclizine (ANTIVERT) 12.5 MG tablet Take 12.5 mg by mouth 2 (two) times daily.     meloxicam (MOBIC) 15 MG tablet Take 15 mg by mouth daily.     Oxycodone HCl 10 MG TABS Take 10 mg by mouth in the morning, at noon, in the evening, and at bedtime.     polyethylene glycol (MIRALAX / GLYCOLAX) 17 g packet Take 17 g by mouth daily as needed for moderate constipation.     rOPINIRole (REQUIP) 1 MG tablet Take 1 mg by mouth 2 (two) times daily.      temazepam (RESTORIL) 15 MG capsule Take 30 mg by mouth at bedtime.      torsemide (DEMADEX) 20 MG tablet Take by mouth.     TRELEGY ELLIPTA 100-62.5-25 MCG/ACT AEPB Take 1 puff by mouth daily.     acetaminophen (TYLENOL) 500 MG tablet Take 500 mg by mouth every 6 (six) hours as needed for moderate pain. (Patient not taking: Reported on 09/19/2023)     ondansetron (ZOFRAN) 4 MG tablet Take 1 tablet (4 mg total) by mouth every 8 (eight) hours as needed for nausea or vomiting. 10 tablet 0   docusate sodium (COLACE) 100 MG capsule Take 100 mg by mouth every evening.     furosemide (LASIX) 40 MG tablet Take 40 mg by mouth See admin instructions. Take 40 mg twice daily, may take a third 40 mg dose as needed for swelling     HYDROmorphone (DILAUDID) 2 MG tablet Take 1-2 tablets (2-4 mg total) by mouth every 6 (six) hours as needed (prn post op severe pain, resume your maintenance oxycodone asap). 30 tablet 0   venlafaxine (EFFEXOR) 75 MG tablet Take 75 mg by mouth 2 (two)  times daily.     No facility-administered medications prior to visit.    Past Medical History:  Diagnosis Date   Bell's palsy    right   Cancer [facility])    skin   COPD (chronic obstructive pulmonary disease) (HCC)    mild   Decrease in appetite 09/25/2015   Depression    Diarrhea 09/25/2015   Dyspnea    with exertion    Hemochromatosis    High blood pressure    History of vaginal bleeding 09/05/2015   LLQ pain 09/25/2015   Neuropathy    Osteoarthritis    knees   Vaginal atrophy 09/05/2015   Weight loss 09/25/2015      Objective:     BP 120/72   Pulse 68   Ht 5' 4.5" (1.638 m)  Wt 271 lb 6.4 oz (123.1 kg)   SpO2 90% Comment: room air  BMI 45.87 kg/m   SpO2: 90 % (room air)  Amb  MO (by BMI) pleasant wf with 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  ext warm without deformities Or obvious joint restrictions  calf tenderness, cyanosis or clubbing    SKIN: warm and dry without lesions    NEURO:  alert, approp, nl sensorium with  peripheral VIIth  palsy L side   CXR PA and Lateral:   09/19/2023 :    I personally reviewed images and impression is as follows:      mod kyphosis/ CM s chf   Labs 09/02/23  Hct 41.2, HC03 = 28   Pending:  TSH, BNP, alpha one AT     Assessment   DOE (dyspnea on exertion) Onset after admit Jan 2024 dx :  CAP  - 09/19/2023   Walked on RA  x  1  lap(s) =  approx 150  ft  @ very slow/ cane pace, stopped due to sob  with lowest 02 sats 92%    Symptoms are markedly disproportionate to objective findings and not clear to what extent this is  actually a pulmonary  problem but pt does appear to have difficult to sort out respiratory symptoms of unknown origin for which  DDX  = almost all start with A and  include Adherence, Ace Inhibitors, Acid Reflux, Active Sinus Disease, Alpha 1 Antitripsin deficiency, Anxiety masquerading as Airways dz,  ABPA,  Allergy(esp in young), Aspiration (esp in elderly), Adverse effects of meds,  Active smoking or Vaping, A bunch of PE's/clot burden (a few small clots can't cause this syndrome unless there is already severe underlying pulm or vascular dz with poor reserve),  Anemia or thyroid disorder, plus two Bs  = Bronchiectasis and Beta blocker use..and one C= CHF    Adherence is always the initial "prime suspect" and is a multilayered concern that requires a "trust but verify" approach in every patient - starting with knowing how to use medications, especially inhalers, correctly, keeping up with refills and understanding the fundamental difference between maintenance and prns vs those medications only taken for a very short course and then stopped and not refilled.  - return with all meds in hand using a trust but verify approach to confirm accurate Medication  Reconciliation The principal here is that until we are certain that the  patients are doing what we've asked, it makes no sense to ask them to do more.    BOLDED dx's of concern but can largely be ruled out by labs done already or today   ? Allergy / asthma > she doesn't think trelegy has helped so ok to try off to see what change if any in ex tol or need for saba: Re SABA :  I spent extra time with pt today reviewing appropriate use of albuterol for prn use on  exertion with the following points: 1) saba is for relief of sob that does not improve by walking a slower pace or resting but rather if the pt does not improve after trying this first. 2) If the pt is convinced, as many are, that saba helps recover from activity faster then it's easy to tell if  this is the case by re-challenging : ie stop, take the inhaler, then p 5 minutes try the exact same activity (intensity of workload) that just caused the symptoms and see if they are substantially diminished or not after saba 3) if there is an activity that reproducibly causes the symptoms, try the saba 15 min before the activity on alternate days   If in fact the saba really does help, then fine to continue to use it prn but advised may need to look closer at the maintenance regimen (trelegy vs nothing)  being used to achieve better control of airways disease with exertion.   >>> pfts off trelegy on return    ? Anxiety /depression/ deconditioning > usually at the bottom of this list of usual suspects but   may interfere with adherence and also interpretation of response or lack thereof to symptom management which can be quite subjective.  >>> defer to PCP    F/u in 4 weeks planned        Morbid obesity due to excess calories (HCC) Body mass index is 45.87 kg/m.    Lab Results  Component Value Date   TSH 0.30 (L) 11/06/2007      Contributing to doe and risk of GERD/dvt/ PE  >>>   reviewed the need and the process to achieve and maintain neg calorie balance > defer f/u primary care including intermittently monitoring thyroid status      Each maintenance medication was reviewed in detail including emphasizing most importantly the difference between maintenance and prns and under what circumstances the prns are to be triggered using an action plan format where appropriate.  Total time for H and P, chart review, counseling, reviewing hfa/dpi/neb device(s) , directly observing portions of ambulatory 02 saturation study/ and generating customized AVS unique to this office visit / same day charting =  48 min                   Sandrea Hughs, MD 09/19/2023

## 2023-09-24 LAB — D-DIMER, QUANTITATIVE: D-DIMER: 2.85 mg{FEU}/L — ABNORMAL HIGH (ref 0.00–0.49)

## 2023-09-24 LAB — TSH: TSH: 0.74 u[IU]/mL (ref 0.450–4.500)

## 2023-09-24 LAB — BRAIN NATRIURETIC PEPTIDE: BNP: 150.6 pg/mL — ABNORMAL HIGH (ref 0.0–100.0)

## 2023-09-24 LAB — ALPHA-1-ANTITRYPSIN PHENOTYP: A-1 Antitrypsin: 158 mg/dL (ref 101–187)

## 2023-10-01 ENCOUNTER — Other Ambulatory Visit: Payer: Self-pay

## 2023-10-01 DIAGNOSIS — R0789 Other chest pain: Secondary | ICD-10-CM | POA: Diagnosis not present

## 2023-10-01 DIAGNOSIS — I700 Atherosclerosis of aorta: Secondary | ICD-10-CM | POA: Diagnosis not present

## 2023-10-01 DIAGNOSIS — R7989 Other specified abnormal findings of blood chemistry: Secondary | ICD-10-CM

## 2023-10-01 DIAGNOSIS — I708 Atherosclerosis of other arteries: Secondary | ICD-10-CM | POA: Diagnosis not present

## 2023-10-01 DIAGNOSIS — I5033 Acute on chronic diastolic (congestive) heart failure: Secondary | ICD-10-CM | POA: Diagnosis not present

## 2023-10-01 DIAGNOSIS — I10 Essential (primary) hypertension: Secondary | ICD-10-CM | POA: Diagnosis not present

## 2023-10-01 DIAGNOSIS — R0600 Dyspnea, unspecified: Secondary | ICD-10-CM | POA: Diagnosis not present

## 2023-10-01 DIAGNOSIS — E785 Hyperlipidemia, unspecified: Secondary | ICD-10-CM | POA: Diagnosis not present

## 2023-10-01 DIAGNOSIS — I517 Cardiomegaly: Secondary | ICD-10-CM | POA: Diagnosis not present

## 2023-10-01 DIAGNOSIS — R0989 Other specified symptoms and signs involving the circulatory and respiratory systems: Secondary | ICD-10-CM

## 2023-10-01 DIAGNOSIS — Z6841 Body Mass Index (BMI) 40.0 and over, adult: Secondary | ICD-10-CM | POA: Diagnosis not present

## 2023-10-02 ENCOUNTER — Telehealth: Payer: Self-pay | Admitting: Internal Medicine

## 2023-10-02 ENCOUNTER — Other Ambulatory Visit: Payer: Self-pay

## 2023-10-02 DIAGNOSIS — R7989 Other specified abnormal findings of blood chemistry: Secondary | ICD-10-CM

## 2023-10-02 DIAGNOSIS — R0989 Other specified symptoms and signs involving the circulatory and respiratory systems: Secondary | ICD-10-CM

## 2023-10-02 NOTE — Telephone Encounter (Signed)
 Spoke with patient and informed her I was gong to fax her information and the order for the CTA chest, PE protocol to [facility] central scheduling----scheduling will call her to set up her appointment---patient did not want me to cancel the 11/13/23 appointment at [facility] and she stated she would call and let me know when she is scheduled in [place]

## 2023-10-02 NOTE — Telephone Encounter (Signed)
 Spoke with patent regarding the bilateral lower extremity u/s scheduled Wednesday 10/08/23 at 2:30 pm at [facility] time is 2:15 pm 1st floor registration desk for check in---CTA chest scheduled for 11/13/23 at 8:00 am at [facility]---patient request I speak with Dr. Sherene Sires about scheduling at [facility] in [place]

## 2023-10-03 NOTE — Telephone Encounter (Signed)
 Copied from CRM [id number]. Topic: General - Other >> Oct 03, 2023 10:16 AM Jessica Serrano wrote: Reason for CRM: Patient is requesting a phone call back from [place] - Patient would discuss tests with you and did not provide further details.   Patient called and stated she is having her CTA chest at [facility] in [place], [place] 10/14/23 at 8:30 am---I asked patient to request results be sent to Dr. Peterson Lombard voiced her understanding

## 2023-10-08 ENCOUNTER — Ambulatory Visit (HOSPITAL_COMMUNITY)
Admission: RE | Admit: 2023-10-08 | Discharge: 2023-10-08 | Disposition: A | Discharge status: Home / Self Care | Source: Ambulatory Visit | Attending: Internal Medicine | Admitting: Internal Medicine

## 2023-10-08 DIAGNOSIS — R7989 Other specified abnormal findings of blood chemistry: Secondary | ICD-10-CM | POA: Insufficient documentation

## 2023-10-08 DIAGNOSIS — R0989 Other specified symptoms and signs involving the circulatory and respiratory systems: Secondary | ICD-10-CM | POA: Insufficient documentation

## 2023-10-12 NOTE — Progress Notes (Unsigned)
 Jessica Serrano, female    DOB: 1979-12-30    MRN: [id number]   Brief patient profile:  83  yowf  quit smoking 1992  referred to pulmonary clinic in [place]  09/19/2023 by Dr Reuel Boom  for doe since  admit   Baseline using cane for balance/ back and hip problem @ wt 250  Had nebulizer not using pre- admit   Admit date: 08/05/2022 Discharge date: 08/09/2022   Discharge Diagnoses  Acute respiratory failure with hypoxia (CMS-HCC) Hypertension GERD (gastroesophageal reflux disease) Pain of both hip joints Hypercholesterolemia Body mass index 39.0-39.9, adult Lumbar and sacral spondyloarthritis Left ventricular 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 On the day of admission her symptoms worsen. She presented to the ER x-ray revealed multifocal pneumonia. Her oxygen dipped down into the 80s. She required supplemental oxygen. She was started on Rocephin and IV Zithromax as she spiked a temperature as well. She had been up-to-date on flu shots RSV and pneumonia shots. There was some concern about congestive heart failure and she was given a dose of IV Lasix. Because these factors she required more than 2 midnights in the hospital. During hospitalization she was treated with IV Rocephin Zithromax and Decadron. She was treated with DuoNeb. Since that time she is improved dramatically with decreased sputum production. She has required O2 at times. We will need to see if she requires O2 before she goes home. Will arrange for home physical therapy and Occupational Therapy if needed. She will continue her usual  medications. We will call with a follow-up appointment in office. She will resume her other medications.     History of Present Illness  09/19/2023  Pulmonary/ 1st office eval/ Kishana Battey / [place] [facility]  Chief Complaint  Patient presents with   Consult    Consults having trouble with breathing at rest and walking   Dyspnea:  10 ft assoc /assoc fluid  Cough: no  Sleep: lift chair x 30 degrees  SABA use: not helping including nebulizer  02:none   Rec Ok to try albuterol 15 min before an activity (on alternating days)  that you know would usually make you short of breath  We will walk you today to see if your need oxygen with exertion.  Please remember to go to the lab department   for your tests  BNP 151/ pos d dimer/ neg venous dopplers  Please remember to go to the  x-ray department > cxr nad  My office will be contacting you by phone for referral for PFTs at [facility] - not done as of 10/15/2023    Please schedule a follow up office visit in 4 weeks, sooner if needed  with all medications /inhalers/ solutions in hand     10/15/2023  f/u ov/[place] office/Luellen Howson re: GOLD 2 copd  maint on trelegy  did  bring meds  Chief Complaint  Patient presents with   Dyspnea on Exertion   Shortness of Breath   Dyspnea:  gives out p 10 ft so just goes out to doctors offices  Cough: none  Sleeping: 30 degrees lift chair ok    resp cc  SABA use: not often using, doesn't really think it helps, nor trelegy  02: none     No obvious day to day or daytime variability or assoc excess/ purulent sputum or mucus plugs or hemoptysis or cp or chest tightness, subjective wheeze or overt sinus or hb symptoms.    Also denies any obvious fluctuation of symptoms with weather or environmental changes or other aggravating or alleviating factors except as outlined above   No unusual exposure hx or h/o childhood pna/ asthma or knowledge of premature birth.  Current Allergies, Complete Past Medical History,  Past Surgical History, Family History, and Social History were reviewed in [employer] record.  ROS  The following are not active complaints unless bolded Hoarseness, sore throat, dysphagia, dental problems, itching, sneezing,  nasal congestion or discharge of excess mucus or purulent secretions, ear ache,   fever, chills, sweats, unintended wt loss or wt gain, classically pleuritic or exertional cp,  orthopnea pnd or arm/hand swelling  or leg swelling, presyncope, palpitations, abdominal pain, anorexia, nausea, vomiting, diarrhea  or change in bowel habits or change in bladder habits, change in stools or change in urine, dysuria, hematuria,  rash, arthralgias, visual complaints, headache, numbness, weakness or ataxia or problems with walking or coordination,  change in mood or  memory.        Current Meds  Medication Sig   atenolol (TENORMIN) 50 MG tablet Take 50 mg by mouth every evening.   atorvastatin (LIPITOR) 20 MG tablet Take 20 mg by mouth at bedtime.   baclofen (LIORESAL) 10 MG tablet Take 10 mg by mouth 3 (three) times daily.   gabapentin (NEURONTIN) 600 MG tablet Take 600-1,200 mg by mouth See admin instructions. Take 600 mg in the evening, and 1200 mg at bedtime   ipratropium-albuterol (DUONEB) 0.5-2.5 (3) MG/3ML SOLN Take 3 mLs by nebulization.   JARDIANCE 10 MG TABS tablet Take 10 mg by mouth daily.   losartan (COZAAR) 25 MG tablet Take 25 mg by mouth daily.   meclizine (ANTIVERT) 12.5 MG tablet Take 12.5 mg by mouth 2 (two) times daily.   meloxicam (MOBIC) 15 MG tablet Take 15 mg by mouth daily.   Oxycodone HCl 10 MG TABS Take 10 mg by mouth in the morning, at noon, in the evening, and at bedtime.   polyethylene glycol (MIRALAX / GLYCOLAX) 17 g packet Take 17 g by mouth daily as needed for moderate constipation.   rOPINIRole (REQUIP) 1 MG tablet Take 1 mg by mouth 2 (two) times daily.    temazepam (RESTORIL) 15 MG capsule Take 30 mg by mouth at bedtime.     torsemide (DEMADEX) 20 MG tablet Take by mouth.   TRELEGY ELLIPTA 100-62.5-25 MCG/ACT AEPB Take 1 puff by mouth daily.             Past Medical History:  Diagnosis Date   Bell's palsy    right   Cancer [facility])    skin   COPD (chronic obstructive pulmonary disease) (HCC)    mild   Decrease in appetite 09/25/2015   Depression    Diarrhea 09/25/2015   Dyspnea    with exertion    Hemochromatosis    High blood pressure    History of vaginal bleeding 09/05/2015   LLQ pain 09/25/2015   Neuropathy    Osteoarthritis    knees   Vaginal atrophy 09/05/2015   Weight loss 09/25/2015      Objective:   Wt Readings from Last 3 Encounters:  10/15/23 272 lb (123.4 kg)  09/19/23 271 lb 6.4 oz (123.1 kg)  12/14/20 239 lb (108.4 kg)   Vital signs reviewed  10/15/2023  - Note at rest 02 sats  94% on RA   General appearance:    MO (by bmi) amb wf / walks with cane       HEENT : Oropharynx  clear     Nasal turbinates nl    NECK :  without  apparent JVD/ palpable Nodes/TM    LUNGS: no acc muscle use,  mod kyphotic contour chest which is clear to A and P bilaterally without cough on insp or exp maneuvers   CV:  RRR  no s3 or murmur or increase in P2, and trace edema both LE  ABD:  soft and nontender   MS:  Gait nl   ext warm without deformities Or obvious joint restrictions  calf tenderness, cyanosis or clubbing    SKIN: warm and dry with venous stasis changes both LE  "x 2 y" per pt   NEURO:  alert, approp, nl sensorium with  no motor or cerebellar deficits apparent.   Labs 09/02/23  Hct 41.2, HC03 = 28     alpha one AT MM BNP  151 TSH  0.74    Assessment      Lab Results  Component Value Date   TSH 0.30 (L) 11/06/2007

## 2023-10-13 ENCOUNTER — Telehealth: Payer: Self-pay

## 2023-10-13 DIAGNOSIS — Z01812 Encounter for preprocedural laboratory examination: Secondary | ICD-10-CM

## 2023-10-13 NOTE — Telephone Encounter (Signed)
 Copied from CRM [id number]. Topic: General - Other >> Oct 13, 2023  9:32 AM Gaetano Hawthorne wrote: Reason for CRM: [facility] needs a BUN and Creatinine Test order for this patient - the patient has an appointment tomorrow for CTA of the chest - They will draw the BUN and Creatinine in the office prior to the scan. Please enter the order in Epic and it can also be faxed to  [phone].

## 2023-10-13 NOTE — Telephone Encounter (Signed)
 Orders placed and sent to number provided.

## 2023-10-14 ENCOUNTER — Ambulatory Visit (HOSPITAL_COMMUNITY)
Admission: RE | Admit: 2023-10-14 | Discharge: 2023-10-14 | Disposition: A | Discharge status: Home / Self Care | Source: Ambulatory Visit | Attending: Internal Medicine | Admitting: Internal Medicine

## 2023-10-14 DIAGNOSIS — R0989 Other specified symptoms and signs involving the circulatory and respiratory systems: Secondary | ICD-10-CM | POA: Diagnosis not present

## 2023-10-14 DIAGNOSIS — J432 Centrilobular emphysema: Secondary | ICD-10-CM | POA: Diagnosis not present

## 2023-10-14 DIAGNOSIS — R0609 Other forms of dyspnea: Secondary | ICD-10-CM | POA: Diagnosis not present

## 2023-10-14 DIAGNOSIS — R7989 Other specified abnormal findings of blood chemistry: Secondary | ICD-10-CM | POA: Diagnosis not present

## 2023-10-14 DIAGNOSIS — Z01812 Encounter for preprocedural laboratory examination: Secondary | ICD-10-CM | POA: Diagnosis not present

## 2023-10-14 DIAGNOSIS — K8689 Other specified diseases of pancreas: Secondary | ICD-10-CM | POA: Diagnosis not present

## 2023-10-14 LAB — PULMONARY FUNCTION TEST
DL/VA % pred: 91 %
DL/VA: 3.76 ml/min/mmHg/L
DLCO unc % pred: 64 %
DLCO unc: 11.83 ml/min/mmHg
FEF 25-75 Post: 0.88 L/s
FEF 25-75 Pre: 0.62 L/s
FEF2575-%Change-Post: 41 %
FEF2575-%Pred-Post: 64 %
FEF2575-%Pred-Pre: 45 %
FEV1-%Change-Post: 12 %
FEV1-%Pred-Post: 78 %
FEV1-%Pred-Pre: 70 %
FEV1-Post: 1.5 L
FEV1-Pre: 1.34 L
FEV1FVC-%Change-Post: 1 %
FEV1FVC-%Pred-Pre: 81 %
FEV6-%Change-Post: 8 %
FEV6-%Pred-Post: 94 %
FEV6-%Pred-Pre: 86 %
FEV6-Post: 2.29 L
FEV6-Pre: 2.11 L
FEV6FVC-%Change-Post: -1 %
FEV6FVC-%Pred-Post: 98 %
FEV6FVC-%Pred-Pre: 100 %
FVC-%Change-Post: 10 %
FVC-%Pred-Post: 96 %
FVC-%Pred-Pre: 86 %
FVC-Post: 2.47 L
FVC-Pre: 2.23 L
Post FEV1/FVC ratio: 61 %
Post FEV6/FVC ratio: 93 %
Pre FEV1/FVC ratio: 60 %
Pre FEV6/FVC Ratio: 94 %
RV % pred: 123 %
RV: 2.98 L
TLC % pred: 103 %
TLC: 5.25 L

## 2023-10-14 MED ORDER — ALBUTEROL SULFATE (2.5 MG/3ML) 0.083% IN NEBU
2.5000 mg | INHALATION_SOLUTION | Freq: Once | RESPIRATORY_TRACT | Status: AC
  Administered 2023-10-14: 2.5 mg via RESPIRATORY_TRACT

## 2023-10-15 ENCOUNTER — Encounter: Payer: Self-pay | Admitting: Internal Medicine

## 2023-10-15 ENCOUNTER — Ambulatory Visit: Admitting: Internal Medicine

## 2023-10-15 VITALS — BP 122/64 | HR 54 | Ht 64.0 in | Wt 272.0 lb

## 2023-10-15 DIAGNOSIS — R0609 Other forms of dyspnea: Secondary | ICD-10-CM | POA: Diagnosis not present

## 2023-10-15 NOTE — Assessment & Plan Note (Addendum)
 Onset after admit Jan 2024 dx :  CAP  - 09/19/2023   Walked on RA  x  1  lap(s) =  approx 150  ft  @ very slow/ cane pace, stopped due to sob  with lowest 02 sats 92%   - Venous dopplers 4//2/25  nl  (elevated d dimer)  - PFT's  10/14/23  FEV1 1.50 (78 % ) ratio 0.61  p 12  % improvement from saba p 0 prior to study with DLCO  18/5 (64%)   and FV curve min concave curvature  and ERV 53% at wt 270   Clearly limited by obesity/ conditioning and not copd or asthma though have not completely ruled out the latter.   Rec try off trelegy and just use duoneb prn   Re SABA :  I spent extra time with pt today reviewing appropriate use of albuterol for prn use on exertion with the following points: 1) saba is for relief of sob that does not improve by walking a slower pace or resting but rather if the pt does not improve after trying this first. 2) If the pt is convinced, as many are, that saba helps recover from activity faster then it's easy to tell if this is the case by re-challenging : ie stop, take the inhaler, then p 5 minutes try the exact same activity (intensity of workload) that just caused the symptoms and see if they are substantially diminished or not after saba 3) if there is an activity that reproducibly causes the symptoms, try the saba 15 min before the activity on alternate days   If in fact the saba really does help, then fine to continue to use it prn but advised may need to look closer at the maintenance regimen being used (presently trelegy vs nothing for a full week) to achieve better control of airways disease with exertion.   Pulmonary f/u is as needed

## 2023-10-15 NOTE — Assessment & Plan Note (Signed)
 PFTs 09/13/23 with ERV 58% at wt 270 c/w effects of obesity on lung vol  Body mass index is 46.69 kg/m.    Lab Results  Component Value Date   TSH 0.740 09/19/2023      Contributing to doe and risk of GERD/dvt/ PE >>>   reviewed the need and the process to achieve and maintain neg calorie balance > defer f/u primary care including intermittently monitoring thyroid status            Each maintenance medication was reviewed in detail including emphasizing most importantly the difference between maintenance and prns and under what circumstances the prns are to be triggered using an action plan format where appropriate.  Total time for H and P, chart review, counseling, reviewing neb vs dpi device(s) and generating customized AVS unique to this office visit / same day charting = 40 min summary final f/u ov

## 2023-10-15 NOTE — Patient Instructions (Signed)
 Ok to try off trelegy a week or not  to see if really helps or not   Only use your albuterol as a rescue medication to be used if you can't catch your breath by resting or doing a relaxed purse lip breathing pattern.  - The less you use it, the better it will work when you need it. - Ok to use up to one half vial  every 4 hours if you must but call for immediate appointment if use goes up over your usual need   Also  Ok to try albuterol 15 min before an activity (on alternating days)  that you know would usually make you short of breath and see if it makes any difference and if makes none then don't take albuterol after activity unless you can't catch your breath as this means it's the resting that helps, not the albuterol.     Pulmonary follow up is as neede

## 2023-10-21 ENCOUNTER — Encounter: Payer: Self-pay | Admitting: Internal Medicine

## 2023-10-22 DIAGNOSIS — M79672 Pain in left foot: Secondary | ICD-10-CM | POA: Diagnosis not present

## 2023-10-22 DIAGNOSIS — M79675 Pain in left toe(s): Secondary | ICD-10-CM | POA: Diagnosis not present

## 2023-10-22 DIAGNOSIS — I739 Peripheral vascular disease, unspecified: Secondary | ICD-10-CM | POA: Diagnosis not present

## 2023-10-22 DIAGNOSIS — M79674 Pain in right toe(s): Secondary | ICD-10-CM | POA: Diagnosis not present

## 2023-10-22 DIAGNOSIS — L110 Acquired keratosis follicularis: Secondary | ICD-10-CM | POA: Diagnosis not present

## 2023-10-22 DIAGNOSIS — M79671 Pain in right foot: Secondary | ICD-10-CM | POA: Diagnosis not present

## 2023-11-03 DIAGNOSIS — H43393 Other vitreous opacities, bilateral: Secondary | ICD-10-CM | POA: Diagnosis not present

## 2023-11-13 ENCOUNTER — Other Ambulatory Visit (HOSPITAL_COMMUNITY)

## 2023-11-14 DIAGNOSIS — J441 Chronic obstructive pulmonary disease with (acute) exacerbation: Secondary | ICD-10-CM | POA: Diagnosis not present

## 2023-11-14 DIAGNOSIS — Z6841 Body Mass Index (BMI) 40.0 and over, adult: Secondary | ICD-10-CM | POA: Diagnosis not present

## 2023-12-21 DIAGNOSIS — N179 Acute kidney failure, unspecified: Secondary | ICD-10-CM | POA: Diagnosis not present

## 2023-12-21 DIAGNOSIS — Z87891 Personal history of nicotine dependence: Secondary | ICD-10-CM | POA: Diagnosis not present

## 2023-12-21 DIAGNOSIS — E785 Hyperlipidemia, unspecified: Secondary | ICD-10-CM | POA: Diagnosis not present

## 2023-12-21 DIAGNOSIS — M47816 Spondylosis without myelopathy or radiculopathy, lumbar region: Secondary | ICD-10-CM | POA: Diagnosis not present

## 2023-12-21 DIAGNOSIS — W19XXXA Unspecified fall, initial encounter: Secondary | ICD-10-CM | POA: Diagnosis not present

## 2023-12-21 DIAGNOSIS — R251 Tremor, unspecified: Secondary | ICD-10-CM | POA: Diagnosis not present

## 2023-12-21 DIAGNOSIS — I10 Essential (primary) hypertension: Secondary | ICD-10-CM | POA: Diagnosis not present

## 2023-12-21 DIAGNOSIS — M48061 Spinal stenosis, lumbar region without neurogenic claudication: Secondary | ICD-10-CM | POA: Diagnosis not present

## 2023-12-21 DIAGNOSIS — R29810 Facial weakness: Secondary | ICD-10-CM | POA: Diagnosis not present

## 2023-12-21 DIAGNOSIS — I491 Atrial premature depolarization: Secondary | ICD-10-CM | POA: Diagnosis not present

## 2023-12-21 DIAGNOSIS — R531 Weakness: Secondary | ICD-10-CM | POA: Diagnosis not present

## 2023-12-21 DIAGNOSIS — R2240 Localized swelling, mass and lump, unspecified lower limb: Secondary | ICD-10-CM | POA: Diagnosis not present

## 2023-12-21 DIAGNOSIS — M4316 Spondylolisthesis, lumbar region: Secondary | ICD-10-CM | POA: Diagnosis not present

## 2023-12-21 DIAGNOSIS — I440 Atrioventricular block, first degree: Secondary | ICD-10-CM | POA: Diagnosis not present

## 2023-12-21 DIAGNOSIS — N1832 Chronic kidney disease, stage 3b: Secondary | ICD-10-CM | POA: Diagnosis not present

## 2023-12-21 DIAGNOSIS — I6381 Other cerebral infarction due to occlusion or stenosis of small artery: Secondary | ICD-10-CM | POA: Diagnosis not present

## 2023-12-21 DIAGNOSIS — Z20822 Contact with and (suspected) exposure to covid-19: Secondary | ICD-10-CM | POA: Diagnosis not present

## 2023-12-21 DIAGNOSIS — R54 Age-related physical debility: Secondary | ICD-10-CM | POA: Diagnosis not present

## 2023-12-21 DIAGNOSIS — R0902 Hypoxemia: Secondary | ICD-10-CM | POA: Diagnosis not present

## 2023-12-21 DIAGNOSIS — E860 Dehydration: Secondary | ICD-10-CM | POA: Diagnosis not present

## 2023-12-21 DIAGNOSIS — E0590 Thyrotoxicosis, unspecified without thyrotoxic crisis or storm: Secondary | ICD-10-CM | POA: Diagnosis not present

## 2023-12-21 DIAGNOSIS — M545 Low back pain, unspecified: Secondary | ICD-10-CM | POA: Diagnosis not present

## 2023-12-21 DIAGNOSIS — I509 Heart failure, unspecified: Secondary | ICD-10-CM | POA: Diagnosis not present

## 2023-12-21 DIAGNOSIS — R2681 Unsteadiness on feet: Secondary | ICD-10-CM | POA: Diagnosis not present

## 2023-12-21 DIAGNOSIS — E870 Hyperosmolality and hypernatremia: Secondary | ICD-10-CM | POA: Diagnosis not present

## 2023-12-21 DIAGNOSIS — Z8249 Family history of ischemic heart disease and other diseases of the circulatory system: Secondary | ICD-10-CM | POA: Diagnosis not present

## 2023-12-21 DIAGNOSIS — I5033 Acute on chronic diastolic (congestive) heart failure: Secondary | ICD-10-CM | POA: Diagnosis not present

## 2023-12-21 DIAGNOSIS — Z6841 Body Mass Index (BMI) 40.0 and over, adult: Secondary | ICD-10-CM | POA: Diagnosis not present

## 2023-12-21 DIAGNOSIS — M549 Dorsalgia, unspecified: Secondary | ICD-10-CM | POA: Diagnosis not present

## 2023-12-21 DIAGNOSIS — K219 Gastro-esophageal reflux disease without esophagitis: Secondary | ICD-10-CM | POA: Diagnosis not present

## 2023-12-21 DIAGNOSIS — Z66 Do not resuscitate: Secondary | ICD-10-CM | POA: Diagnosis not present

## 2023-12-21 DIAGNOSIS — R404 Transient alteration of awareness: Secondary | ICD-10-CM | POA: Diagnosis not present

## 2023-12-21 DIAGNOSIS — Z7409 Other reduced mobility: Secondary | ICD-10-CM | POA: Diagnosis not present

## 2023-12-21 DIAGNOSIS — R9431 Abnormal electrocardiogram [ECG] [EKG]: Secondary | ICD-10-CM | POA: Diagnosis not present

## 2023-12-21 DIAGNOSIS — I130 Hypertensive heart and chronic kidney disease with heart failure and stage 1 through stage 4 chronic kidney disease, or unspecified chronic kidney disease: Secondary | ICD-10-CM | POA: Diagnosis not present

## 2023-12-31 DIAGNOSIS — Z7951 Long term (current) use of inhaled steroids: Secondary | ICD-10-CM | POA: Diagnosis not present

## 2023-12-31 DIAGNOSIS — N1832 Chronic kidney disease, stage 3b: Secondary | ICD-10-CM | POA: Diagnosis not present

## 2023-12-31 DIAGNOSIS — M51369 Other intervertebral disc degeneration, lumbar region without mention of lumbar back pain or lower extremity pain: Secondary | ICD-10-CM | POA: Diagnosis not present

## 2023-12-31 DIAGNOSIS — M1990 Unspecified osteoarthritis, unspecified site: Secondary | ICD-10-CM | POA: Diagnosis not present

## 2023-12-31 DIAGNOSIS — J449 Chronic obstructive pulmonary disease, unspecified: Secondary | ICD-10-CM | POA: Diagnosis not present

## 2023-12-31 DIAGNOSIS — G8929 Other chronic pain: Secondary | ICD-10-CM | POA: Diagnosis not present

## 2023-12-31 DIAGNOSIS — N179 Acute kidney failure, unspecified: Secondary | ICD-10-CM | POA: Diagnosis not present

## 2023-12-31 DIAGNOSIS — K219 Gastro-esophageal reflux disease without esophagitis: Secondary | ICD-10-CM | POA: Diagnosis not present

## 2023-12-31 DIAGNOSIS — I130 Hypertensive heart and chronic kidney disease with heart failure and stage 1 through stage 4 chronic kidney disease, or unspecified chronic kidney disease: Secondary | ICD-10-CM | POA: Diagnosis not present

## 2023-12-31 DIAGNOSIS — I5030 Unspecified diastolic (congestive) heart failure: Secondary | ICD-10-CM | POA: Diagnosis not present

## 2023-12-31 DIAGNOSIS — E782 Mixed hyperlipidemia: Secondary | ICD-10-CM | POA: Diagnosis not present

## 2023-12-31 DIAGNOSIS — F331 Major depressive disorder, recurrent, moderate: Secondary | ICD-10-CM | POA: Diagnosis not present

## 2024-01-07 DIAGNOSIS — N1832 Chronic kidney disease, stage 3b: Secondary | ICD-10-CM | POA: Diagnosis not present

## 2024-01-07 DIAGNOSIS — I10 Essential (primary) hypertension: Secondary | ICD-10-CM | POA: Diagnosis not present

## 2024-01-07 DIAGNOSIS — R739 Hyperglycemia, unspecified: Secondary | ICD-10-CM | POA: Diagnosis not present

## 2024-01-07 DIAGNOSIS — E7849 Other hyperlipidemia: Secondary | ICD-10-CM | POA: Diagnosis not present

## 2024-01-13 DIAGNOSIS — Z6841 Body Mass Index (BMI) 40.0 and over, adult: Secondary | ICD-10-CM | POA: Diagnosis not present

## 2024-01-13 DIAGNOSIS — J449 Chronic obstructive pulmonary disease, unspecified: Secondary | ICD-10-CM | POA: Diagnosis not present

## 2024-01-13 DIAGNOSIS — E782 Mixed hyperlipidemia: Secondary | ICD-10-CM | POA: Diagnosis not present

## 2024-01-13 DIAGNOSIS — I10 Essential (primary) hypertension: Secondary | ICD-10-CM | POA: Diagnosis not present

## 2024-01-13 DIAGNOSIS — E7849 Other hyperlipidemia: Secondary | ICD-10-CM | POA: Diagnosis not present

## 2024-01-14 DIAGNOSIS — N1832 Chronic kidney disease, stage 3b: Secondary | ICD-10-CM | POA: Diagnosis not present

## 2024-01-14 DIAGNOSIS — E782 Mixed hyperlipidemia: Secondary | ICD-10-CM | POA: Diagnosis not present

## 2024-01-14 DIAGNOSIS — I5030 Unspecified diastolic (congestive) heart failure: Secondary | ICD-10-CM | POA: Diagnosis not present

## 2024-01-14 DIAGNOSIS — J449 Chronic obstructive pulmonary disease, unspecified: Secondary | ICD-10-CM | POA: Diagnosis not present

## 2024-01-14 DIAGNOSIS — I130 Hypertensive heart and chronic kidney disease with heart failure and stage 1 through stage 4 chronic kidney disease, or unspecified chronic kidney disease: Secondary | ICD-10-CM | POA: Diagnosis not present

## 2024-01-15 DIAGNOSIS — E782 Mixed hyperlipidemia: Secondary | ICD-10-CM | POA: Diagnosis not present

## 2024-01-15 DIAGNOSIS — N1832 Chronic kidney disease, stage 3b: Secondary | ICD-10-CM | POA: Diagnosis not present

## 2024-01-15 DIAGNOSIS — I5030 Unspecified diastolic (congestive) heart failure: Secondary | ICD-10-CM | POA: Diagnosis not present

## 2024-01-15 DIAGNOSIS — I130 Hypertensive heart and chronic kidney disease with heart failure and stage 1 through stage 4 chronic kidney disease, or unspecified chronic kidney disease: Secondary | ICD-10-CM | POA: Diagnosis not present

## 2024-01-15 DIAGNOSIS — J449 Chronic obstructive pulmonary disease, unspecified: Secondary | ICD-10-CM | POA: Diagnosis not present

## 2024-01-20 DIAGNOSIS — N1832 Chronic kidney disease, stage 3b: Secondary | ICD-10-CM | POA: Diagnosis not present

## 2024-01-22 DIAGNOSIS — I10 Essential (primary) hypertension: Secondary | ICD-10-CM | POA: Diagnosis not present

## 2024-01-22 DIAGNOSIS — E782 Mixed hyperlipidemia: Secondary | ICD-10-CM | POA: Diagnosis not present

## 2024-01-22 DIAGNOSIS — M79671 Pain in right foot: Secondary | ICD-10-CM | POA: Diagnosis not present

## 2024-01-22 DIAGNOSIS — I739 Peripheral vascular disease, unspecified: Secondary | ICD-10-CM | POA: Diagnosis not present

## 2024-01-22 DIAGNOSIS — M79674 Pain in right toe(s): Secondary | ICD-10-CM | POA: Diagnosis not present

## 2024-01-22 DIAGNOSIS — L110 Acquired keratosis follicularis: Secondary | ICD-10-CM | POA: Diagnosis not present

## 2024-01-22 DIAGNOSIS — M79675 Pain in left toe(s): Secondary | ICD-10-CM | POA: Diagnosis not present

## 2024-01-22 DIAGNOSIS — M79672 Pain in left foot: Secondary | ICD-10-CM | POA: Diagnosis not present

## 2024-01-22 DIAGNOSIS — J449 Chronic obstructive pulmonary disease, unspecified: Secondary | ICD-10-CM | POA: Diagnosis not present

## 2024-01-30 DIAGNOSIS — K219 Gastro-esophageal reflux disease without esophagitis: Secondary | ICD-10-CM | POA: Diagnosis not present

## 2024-01-30 DIAGNOSIS — I5030 Unspecified diastolic (congestive) heart failure: Secondary | ICD-10-CM | POA: Diagnosis not present

## 2024-01-30 DIAGNOSIS — I130 Hypertensive heart and chronic kidney disease with heart failure and stage 1 through stage 4 chronic kidney disease, or unspecified chronic kidney disease: Secondary | ICD-10-CM | POA: Diagnosis not present

## 2024-01-30 DIAGNOSIS — N179 Acute kidney failure, unspecified: Secondary | ICD-10-CM | POA: Diagnosis not present

## 2024-01-30 DIAGNOSIS — J449 Chronic obstructive pulmonary disease, unspecified: Secondary | ICD-10-CM | POA: Diagnosis not present

## 2024-01-30 DIAGNOSIS — N1832 Chronic kidney disease, stage 3b: Secondary | ICD-10-CM | POA: Diagnosis not present

## 2024-01-30 DIAGNOSIS — M1990 Unspecified osteoarthritis, unspecified site: Secondary | ICD-10-CM | POA: Diagnosis not present

## 2024-01-30 DIAGNOSIS — Z7951 Long term (current) use of inhaled steroids: Secondary | ICD-10-CM | POA: Diagnosis not present

## 2024-01-30 DIAGNOSIS — G8929 Other chronic pain: Secondary | ICD-10-CM | POA: Diagnosis not present

## 2024-01-30 DIAGNOSIS — E782 Mixed hyperlipidemia: Secondary | ICD-10-CM | POA: Diagnosis not present

## 2024-01-30 DIAGNOSIS — F331 Major depressive disorder, recurrent, moderate: Secondary | ICD-10-CM | POA: Diagnosis not present

## 2024-01-30 DIAGNOSIS — M51369 Other intervertebral disc degeneration, lumbar region without mention of lumbar back pain or lower extremity pain: Secondary | ICD-10-CM | POA: Diagnosis not present

## 2024-02-25 DIAGNOSIS — N1832 Chronic kidney disease, stage 3b: Secondary | ICD-10-CM | POA: Diagnosis not present

## 2024-02-25 DIAGNOSIS — E782 Mixed hyperlipidemia: Secondary | ICD-10-CM | POA: Diagnosis not present

## 2024-02-25 DIAGNOSIS — I130 Hypertensive heart and chronic kidney disease with heart failure and stage 1 through stage 4 chronic kidney disease, or unspecified chronic kidney disease: Secondary | ICD-10-CM | POA: Diagnosis not present

## 2024-02-25 DIAGNOSIS — J449 Chronic obstructive pulmonary disease, unspecified: Secondary | ICD-10-CM | POA: Diagnosis not present

## 2024-02-25 DIAGNOSIS — I5030 Unspecified diastolic (congestive) heart failure: Secondary | ICD-10-CM | POA: Diagnosis not present

## 2024-04-01 DIAGNOSIS — R0789 Other chest pain: Secondary | ICD-10-CM | POA: Diagnosis not present

## 2024-04-01 DIAGNOSIS — I493 Ventricular premature depolarization: Secondary | ICD-10-CM | POA: Diagnosis not present

## 2024-04-01 DIAGNOSIS — R0600 Dyspnea, unspecified: Secondary | ICD-10-CM | POA: Diagnosis not present

## 2024-04-01 DIAGNOSIS — I700 Atherosclerosis of aorta: Secondary | ICD-10-CM | POA: Diagnosis not present

## 2024-04-01 DIAGNOSIS — E782 Mixed hyperlipidemia: Secondary | ICD-10-CM | POA: Diagnosis not present

## 2024-04-01 DIAGNOSIS — I10 Essential (primary) hypertension: Secondary | ICD-10-CM | POA: Diagnosis not present

## 2024-04-01 DIAGNOSIS — Z6841 Body Mass Index (BMI) 40.0 and over, adult: Secondary | ICD-10-CM | POA: Diagnosis not present

## 2024-04-01 DIAGNOSIS — I517 Cardiomegaly: Secondary | ICD-10-CM | POA: Diagnosis not present

## 2024-04-01 DIAGNOSIS — I5033 Acute on chronic diastolic (congestive) heart failure: Secondary | ICD-10-CM | POA: Diagnosis not present

## 2024-04-01 DIAGNOSIS — I708 Atherosclerosis of other arteries: Secondary | ICD-10-CM | POA: Diagnosis not present

## 2024-04-02 DIAGNOSIS — I739 Peripheral vascular disease, unspecified: Secondary | ICD-10-CM | POA: Diagnosis not present

## 2024-04-02 DIAGNOSIS — M79674 Pain in right toe(s): Secondary | ICD-10-CM | POA: Diagnosis not present

## 2024-04-02 DIAGNOSIS — M79675 Pain in left toe(s): Secondary | ICD-10-CM | POA: Diagnosis not present

## 2024-04-02 DIAGNOSIS — M79672 Pain in left foot: Secondary | ICD-10-CM | POA: Diagnosis not present

## 2024-04-02 DIAGNOSIS — M79671 Pain in right foot: Secondary | ICD-10-CM | POA: Diagnosis not present

## 2024-04-02 DIAGNOSIS — L110 Acquired keratosis follicularis: Secondary | ICD-10-CM | POA: Diagnosis not present

## 2024-04-27 DIAGNOSIS — Z1329 Encounter for screening for other suspected endocrine disorder: Secondary | ICD-10-CM | POA: Diagnosis not present

## 2024-04-27 DIAGNOSIS — R5383 Other fatigue: Secondary | ICD-10-CM | POA: Diagnosis not present

## 2024-04-27 DIAGNOSIS — Z130 Encounter for screening for diseases of the blood and blood-forming organs and certain disorders involving the immune mechanism: Secondary | ICD-10-CM | POA: Diagnosis not present

## 2024-04-27 DIAGNOSIS — R42 Dizziness and giddiness: Secondary | ICD-10-CM | POA: Diagnosis not present

## 2024-04-27 DIAGNOSIS — I10 Essential (primary) hypertension: Secondary | ICD-10-CM | POA: Diagnosis not present

## 2024-04-27 DIAGNOSIS — E876 Hypokalemia: Secondary | ICD-10-CM | POA: Diagnosis not present

## 2024-04-27 DIAGNOSIS — N1832 Chronic kidney disease, stage 3b: Secondary | ICD-10-CM | POA: Diagnosis not present

## 2024-04-27 DIAGNOSIS — R739 Hyperglycemia, unspecified: Secondary | ICD-10-CM | POA: Diagnosis not present

## 2024-04-27 DIAGNOSIS — E7849 Other hyperlipidemia: Secondary | ICD-10-CM | POA: Diagnosis not present

## 2024-05-04 DIAGNOSIS — F331 Major depressive disorder, recurrent, moderate: Secondary | ICD-10-CM | POA: Diagnosis not present

## 2024-05-04 DIAGNOSIS — M25519 Pain in unspecified shoulder: Secondary | ICD-10-CM | POA: Diagnosis not present

## 2024-05-04 DIAGNOSIS — Z6841 Body Mass Index (BMI) 40.0 and over, adult: Secondary | ICD-10-CM | POA: Diagnosis not present

## 2024-05-04 DIAGNOSIS — E876 Hypokalemia: Secondary | ICD-10-CM | POA: Diagnosis not present

## 2024-05-04 DIAGNOSIS — I10 Essential (primary) hypertension: Secondary | ICD-10-CM | POA: Diagnosis not present

## 2024-05-04 DIAGNOSIS — Z23 Encounter for immunization: Secondary | ICD-10-CM | POA: Diagnosis not present

## 2024-05-04 DIAGNOSIS — J449 Chronic obstructive pulmonary disease, unspecified: Secondary | ICD-10-CM | POA: Diagnosis not present

## 2024-05-04 DIAGNOSIS — N1831 Chronic kidney disease, stage 3a: Secondary | ICD-10-CM | POA: Diagnosis not present

## 2024-05-04 DIAGNOSIS — I5030 Unspecified diastolic (congestive) heart failure: Secondary | ICD-10-CM | POA: Diagnosis not present

## 2024-05-04 DIAGNOSIS — J9611 Chronic respiratory failure with hypoxia: Secondary | ICD-10-CM | POA: Diagnosis not present

## 2024-06-10 DIAGNOSIS — L110 Acquired keratosis follicularis: Secondary | ICD-10-CM | POA: Diagnosis not present

## 2024-06-10 DIAGNOSIS — M79674 Pain in right toe(s): Secondary | ICD-10-CM | POA: Diagnosis not present

## 2024-06-10 DIAGNOSIS — M79671 Pain in right foot: Secondary | ICD-10-CM | POA: Diagnosis not present

## 2024-06-10 DIAGNOSIS — I739 Peripheral vascular disease, unspecified: Secondary | ICD-10-CM | POA: Diagnosis not present

## 2024-06-10 DIAGNOSIS — M79672 Pain in left foot: Secondary | ICD-10-CM | POA: Diagnosis not present

## 2024-06-10 DIAGNOSIS — M79675 Pain in left toe(s): Secondary | ICD-10-CM | POA: Diagnosis not present

## 2024-06-16 ENCOUNTER — Other Ambulatory Visit: Payer: Self-pay | Admitting: Orthopedic Surgery

## 2024-06-16 DIAGNOSIS — Z96611 Presence of right artificial shoulder joint: Secondary | ICD-10-CM

## 2024-06-16 DIAGNOSIS — Z471 Aftercare following joint replacement surgery: Secondary | ICD-10-CM | POA: Diagnosis not present

## 2024-06-16 DIAGNOSIS — Z96612 Presence of left artificial shoulder joint: Secondary | ICD-10-CM | POA: Diagnosis not present

## 2024-06-18 ENCOUNTER — Ambulatory Visit
Admission: RE | Admit: 2024-06-18 | Discharge: 2024-06-18 | Disposition: A | Source: Ambulatory Visit | Attending: Orthopedic Surgery | Admitting: Orthopedic Surgery

## 2024-06-18 DIAGNOSIS — Z96611 Presence of right artificial shoulder joint: Secondary | ICD-10-CM | POA: Diagnosis not present

## 2024-06-18 DIAGNOSIS — Z471 Aftercare following joint replacement surgery: Secondary | ICD-10-CM | POA: Diagnosis not present
